# Patient Record
Sex: Male | Born: 1935
Health system: Southern US, Community
[De-identification: ages and names within clinical notes are randomized; demographics above are authoritative.]

## PROBLEM LIST (undated history)

## (undated) DIAGNOSIS — M1990 Unspecified osteoarthritis, unspecified site: Secondary | ICD-10-CM

## (undated) DIAGNOSIS — C8590 Non-Hodgkin lymphoma, unspecified, unspecified site: Secondary | ICD-10-CM

## (undated) DIAGNOSIS — C61 Malignant neoplasm of prostate: Secondary | ICD-10-CM

## (undated) DIAGNOSIS — G2581 Restless legs syndrome: Secondary | ICD-10-CM

## (undated) DIAGNOSIS — I493 Ventricular premature depolarization: Secondary | ICD-10-CM

## (undated) DIAGNOSIS — I4510 Unspecified right bundle-branch block: Secondary | ICD-10-CM

## (undated) DIAGNOSIS — N400 Enlarged prostate without lower urinary tract symptoms: Secondary | ICD-10-CM

## (undated) DIAGNOSIS — E119 Type 2 diabetes mellitus without complications: Secondary | ICD-10-CM

## (undated) DIAGNOSIS — E785 Hyperlipidemia, unspecified: Secondary | ICD-10-CM

## (undated) DIAGNOSIS — R001 Bradycardia, unspecified: Secondary | ICD-10-CM

## (undated) DIAGNOSIS — I4891 Unspecified atrial fibrillation: Secondary | ICD-10-CM

## (undated) DIAGNOSIS — R9439 Abnormal result of other cardiovascular function study: Secondary | ICD-10-CM

## (undated) DIAGNOSIS — G4730 Sleep apnea, unspecified: Secondary | ICD-10-CM

## (undated) DIAGNOSIS — J189 Pneumonia, unspecified organism: Secondary | ICD-10-CM

## (undated) DIAGNOSIS — E039 Hypothyroidism, unspecified: Secondary | ICD-10-CM

## (undated) DIAGNOSIS — Z9289 Personal history of other medical treatment: Secondary | ICD-10-CM

## (undated) HISTORY — DX: Type 2 diabetes mellitus without complications: E11.9

## (undated) HISTORY — PX: MOHS SURGERY: SUR867

## (undated) HISTORY — DX: Unspecified right bundle-branch block: I45.10

## (undated) HISTORY — DX: Non-Hodgkin lymphoma, unspecified, unspecified site: C85.90

## (undated) HISTORY — PX: COLONOSCOPY: SHX174

## (undated) HISTORY — DX: Abnormal result of other cardiovascular function study: R94.39

## (undated) HISTORY — PX: APPENDECTOMY: SHX54

## (undated) HISTORY — PX: OTHER SURGICAL HISTORY: SHX169

## (undated) HISTORY — DX: Unspecified atrial fibrillation: I48.91

## (undated) HISTORY — DX: Bradycardia, unspecified: R00.1

## (undated) HISTORY — DX: Ventricular premature depolarization: I49.3

## (undated) HISTORY — DX: Hyperlipidemia, unspecified: E78.5

## (undated) HISTORY — DX: Benign prostatic hyperplasia without lower urinary tract symptoms: N40.0

---

## 2011-08-14 DIAGNOSIS — L821 Other seborrheic keratosis: Secondary | ICD-10-CM | POA: Diagnosis not present

## 2011-08-14 DIAGNOSIS — L820 Inflamed seborrheic keratosis: Secondary | ICD-10-CM | POA: Diagnosis not present

## 2011-08-14 DIAGNOSIS — D044 Carcinoma in situ of skin of scalp and neck: Secondary | ICD-10-CM | POA: Diagnosis not present

## 2011-08-14 DIAGNOSIS — L570 Actinic keratosis: Secondary | ICD-10-CM | POA: Diagnosis not present

## 2011-08-14 DIAGNOSIS — L259 Unspecified contact dermatitis, unspecified cause: Secondary | ICD-10-CM | POA: Diagnosis not present

## 2011-08-14 DIAGNOSIS — L905 Scar conditions and fibrosis of skin: Secondary | ICD-10-CM | POA: Diagnosis not present

## 2011-08-14 DIAGNOSIS — D485 Neoplasm of uncertain behavior of skin: Secondary | ICD-10-CM | POA: Diagnosis not present

## 2011-08-14 DIAGNOSIS — C4441 Basal cell carcinoma of skin of scalp and neck: Secondary | ICD-10-CM | POA: Diagnosis not present

## 2011-08-26 DIAGNOSIS — L570 Actinic keratosis: Secondary | ICD-10-CM | POA: Diagnosis not present

## 2011-08-26 DIAGNOSIS — C4441 Basal cell carcinoma of skin of scalp and neck: Secondary | ICD-10-CM | POA: Diagnosis not present

## 2011-08-26 DIAGNOSIS — C4442 Squamous cell carcinoma of skin of scalp and neck: Secondary | ICD-10-CM | POA: Diagnosis not present

## 2011-09-18 DIAGNOSIS — L570 Actinic keratosis: Secondary | ICD-10-CM | POA: Diagnosis not present

## 2011-09-18 DIAGNOSIS — L578 Other skin changes due to chronic exposure to nonionizing radiation: Secondary | ICD-10-CM | POA: Diagnosis not present

## 2011-10-13 DIAGNOSIS — M999 Biomechanical lesion, unspecified: Secondary | ICD-10-CM | POA: Diagnosis not present

## 2011-10-13 DIAGNOSIS — M62838 Other muscle spasm: Secondary | ICD-10-CM | POA: Diagnosis not present

## 2012-05-02 DIAGNOSIS — L570 Actinic keratosis: Secondary | ICD-10-CM | POA: Diagnosis not present

## 2012-05-02 DIAGNOSIS — Z85828 Personal history of other malignant neoplasm of skin: Secondary | ICD-10-CM | POA: Diagnosis not present

## 2012-05-02 DIAGNOSIS — T148 Other injury of unspecified body region: Secondary | ICD-10-CM | POA: Diagnosis not present

## 2012-06-01 DIAGNOSIS — N401 Enlarged prostate with lower urinary tract symptoms: Secondary | ICD-10-CM | POA: Diagnosis not present

## 2012-06-16 DIAGNOSIS — Z23 Encounter for immunization: Secondary | ICD-10-CM | POA: Diagnosis not present

## 2012-06-21 DIAGNOSIS — M999 Biomechanical lesion, unspecified: Secondary | ICD-10-CM | POA: Diagnosis not present

## 2012-07-12 DIAGNOSIS — N401 Enlarged prostate with lower urinary tract symptoms: Secondary | ICD-10-CM | POA: Diagnosis not present

## 2012-07-13 ENCOUNTER — Ambulatory Visit (INDEPENDENT_AMBULATORY_CARE_PROVIDER_SITE_OTHER): Payer: Medicare Other | Admitting: Cardiology

## 2012-07-13 ENCOUNTER — Encounter: Payer: Self-pay | Admitting: Cardiology

## 2012-07-13 VITALS — BP 123/64 | HR 48 | Wt 219.0 lb

## 2012-07-13 DIAGNOSIS — I498 Other specified cardiac arrhythmias: Secondary | ICD-10-CM

## 2012-07-13 DIAGNOSIS — Z01810 Encounter for preprocedural cardiovascular examination: Secondary | ICD-10-CM | POA: Diagnosis not present

## 2012-07-13 DIAGNOSIS — R001 Bradycardia, unspecified: Secondary | ICD-10-CM | POA: Insufficient documentation

## 2012-07-13 DIAGNOSIS — Z01818 Encounter for other preprocedural examination: Secondary | ICD-10-CM | POA: Diagnosis not present

## 2012-07-13 DIAGNOSIS — E785 Hyperlipidemia, unspecified: Secondary | ICD-10-CM | POA: Diagnosis not present

## 2012-07-13 NOTE — Assessment & Plan Note (Signed)
Continue statin. 

## 2012-07-13 NOTE — Assessment & Plan Note (Signed)
Plan stress echocardiogram. If no ischemia patient may proceed with surgery.

## 2012-07-13 NOTE — Progress Notes (Signed)
  HPI: 78 year old male for preoperative evaluation prior to transurethral resection of his prostate. No prior cardiac history. Patient has noticed some increasing fatigue but denies dyspnea on exertion, orthopnea, PND, pedal edema, chest pain or syncope. He did state that over the past 2 winters when the weather is cold he has chest tightness with exertion. Because of the above we were asked to evaluate  Current Outpatient Prescriptions  Medication Sig Dispense Refill  . aspirin 81 MG tablet Take 81 mg by mouth daily.      . Cyanocobalamin (VITAMIN B-12 CR PO) Take 1 tablet by mouth daily.      [name] GLUCOSAMINE-CHONDROITIN PO Take 1 tablet by mouth daily.      . Methylsulfonylmethane (MSM PO) Take 1 tablet by mouth daily.      . Omega-3 Fatty Acids (FISH OIL PO) Take 1 tablet by mouth daily.      [name] rOPINIRole (REQUIP) 3 MG tablet Take 3 mg by mouth at bedtime.      . simvastatin (ZOCOR) 40 MG tablet Take 40 mg by mouth every evening.        No Known Allergies  Past Medical History  Diagnosis Date  . Hyperlipidemia   . BPH (benign prostatic hyperplasia)   . Lymphoma     Past Surgical History  Procedure Date  . Appendectomy   . Athroscopic knee surgery     History   Social History  . Marital Status: Widowed    Spouse Name: N/A    Number of Children: 2  . Years of Education: N/A   Occupational History  . Not on file.   Social History Main Topics  . Smoking status: Former Games developer  . Smokeless tobacco: Not on file  . Alcohol Use: Yes     Comment: Rarely  . Drug Use: Not on file  . Sexually Active: Not on file   Other Topics Concern  . Not on file   Social History Narrative  . No narrative on file    Family History  Problem Relation Age of Onset  . CAD Mother     ROS: frequency but no fevers or chills, productive cough, hemoptysis, dysphasia, odynophagia, melena, hematochezia, dysuria, hematuria, rash, seizure activity, orthopnea, PND, pedal edema, claudication.  Remaining systems are negative.  Physical Exam:   Blood pressure 123/64, pulse 48, weight 219 lb (99.338 kg).  General:  Well developed/well nourished in NAD Skin warm/dry Patient not depressed No peripheral clubbing Back-normal HEENT-normal/normal eyelids Neck supple/normal carotid upstroke bilaterally; no bruits; no JVD; no thyromegaly chest - CTA/ normal expansion CV - bradycardic but regular rhythm/normal S1 and S2; no rubs or gallops;  PMI nondisplaced, 2/6 systolic ejection murmur left sternal border. Abdomen -NT/ND, no HSM, no mass, + bowel sounds, no bruit 2+ femoral pulses, no bruits Ext-no edema, chords, 2+ DP Neuro-grossly nonfocal  ECG marked sinus bradycardia at a rate of 48. Left anterior fascicular block. Left ventricular hypertrophy. Nonspecific ST changes.

## 2012-07-13 NOTE — Patient Instructions (Addendum)
Your physician recommends that you schedule a follow-up appointment in: AS NEEDED PENDING TEST RESULTS  Your physician has requested that you have a stress echocardiogram. For further information please visit [email or web address]. Please follow instruction sheet as given.    

## 2012-07-13 NOTE — Assessment & Plan Note (Signed)
Patient does describe some fatigue and resting heart rate is 48. Stress echocardiogram will also help demonstrate chronotropic competence.

## 2012-08-04 ENCOUNTER — Ambulatory Visit (HOSPITAL_COMMUNITY): Payer: Medicare Other | Attending: Cardiology

## 2012-08-04 ENCOUNTER — Encounter: Payer: Self-pay | Admitting: Cardiology

## 2012-08-04 DIAGNOSIS — I495 Sick sinus syndrome: Secondary | ICD-10-CM

## 2012-08-04 DIAGNOSIS — Z01818 Encounter for other preprocedural examination: Secondary | ICD-10-CM

## 2012-08-04 DIAGNOSIS — R0989 Other specified symptoms and signs involving the circulatory and respiratory systems: Secondary | ICD-10-CM

## 2012-08-04 NOTE — Progress Notes (Signed)
Echocardiogram performed.  

## 2012-08-05 ENCOUNTER — Other Ambulatory Visit: Payer: Self-pay | Admitting: Urology

## 2012-08-05 ENCOUNTER — Telehealth: Payer: Self-pay | Admitting: Cardiology

## 2012-08-05 NOTE — Telephone Encounter (Signed)
Spoke with pt, aware of test results, clearance forwarded to dr Patsi Sears. Follow up scheduled

## 2012-08-05 NOTE — Telephone Encounter (Signed)
New Problem:    Patient called in returning your call. Please call back. 

## 2012-08-16 ENCOUNTER — Encounter (HOSPITAL_COMMUNITY): Payer: Self-pay | Admitting: Pharmacy Technician

## 2012-08-17 ENCOUNTER — Other Ambulatory Visit (HOSPITAL_COMMUNITY): Payer: Self-pay | Admitting: Urology

## 2012-08-17 NOTE — Progress Notes (Signed)
LOV, EKG Dr Jens Som 12/13 EPIC and on chart,  Stress eccho 1/14 EPIC and chart, clearance note DR Jens Som dated 08/05/12 chart and EPIC

## 2012-08-17 NOTE — Patient Instructions (Addendum)
22 Travis Bradford  08/17/2012   Your procedure is scheduled on:  08/29/12  MONDAY  Report to [facility] at   0615    AM.  Call this number if you have problems the morning of surgery: [phone]       Remember:   Do not eat food  Or drink :After Midnight. Sunday NIGHT   Take these medicines the morning of surgery with A SIP OF WATER:NONE   .  Contacts, dentures or partial plates can not be worn to surgery  Leave suitcase in the car. After surgery it may be brought to your room.  For patients admitted to the hospital, checkout time is 11:00 AM day of  discharge.             SPECIAL INSTRUCTIONS- SEE [employer] PREPARING FOR SURGERY INSTRUCTION SHEET-     DO NOT WEAR JEWELRY, LOTIONS, POWDERS, OR PERFUMES.  WOMEN-- DO NOT SHAVE LEGS OR UNDERARMS FOR 12 HOURS BEFORE SHOWERS. MEN MAY SHAVE FACE.  Patients discharged the day of surgery will not be allowed to drive home. IF going home the day of surgery, you must have a driver and someone to stay with you for the first 24 hours  Name and phone number of your driver:  Son  [name]                                                                     Please read over the following fact sheets that you were given: MRSA Information, Incentive Spirometry Sheet, Blood Transfusion Sheet  Information                                                                                   [name]  PST [phone]  [phone]                 FAILURE TO FOLLOW THESE INSTRUCTIONS MAY RESULT IN  CANCELLATION   OF YOUR SURGERY                                                  Patient Signature _____________________________

## 2012-08-18 ENCOUNTER — Encounter (HOSPITAL_COMMUNITY): Payer: Self-pay

## 2012-08-18 ENCOUNTER — Encounter (HOSPITAL_COMMUNITY)
Admission: RE | Admit: 2012-08-18 | Discharge: 2012-08-18 | Disposition: A | Discharge status: Home / Self Care | Payer: Medicare Other | Source: Ambulatory Visit | Attending: Urology | Admitting: Urology

## 2012-08-18 DIAGNOSIS — C8589 Other specified types of non-Hodgkin lymphoma, extranodal and solid organ sites: Secondary | ICD-10-CM | POA: Diagnosis not present

## 2012-08-18 DIAGNOSIS — N401 Enlarged prostate with lower urinary tract symptoms: Secondary | ICD-10-CM | POA: Diagnosis not present

## 2012-08-18 DIAGNOSIS — Z79899 Other long term (current) drug therapy: Secondary | ICD-10-CM | POA: Diagnosis not present

## 2012-08-18 DIAGNOSIS — Z01812 Encounter for preprocedural laboratory examination: Secondary | ICD-10-CM | POA: Diagnosis not present

## 2012-08-18 HISTORY — DX: Pneumonia, unspecified organism: J18.9

## 2012-08-18 HISTORY — DX: Unspecified osteoarthritis, unspecified site: M19.90

## 2012-08-18 HISTORY — DX: Personal history of other medical treatment: Z92.89

## 2012-08-18 LAB — CBC
Hemoglobin: 13.4 g/dL (ref 13.0–17.0)
MCH: 33 pg (ref 26.0–34.0)
Platelets: 298 10*3/uL (ref 150–400)
RBC: 4.06 MIL/uL — ABNORMAL LOW (ref 4.22–5.81)
WBC: 8.1 10*3/uL (ref 4.0–10.5)

## 2012-08-18 LAB — SURGICAL PCR SCREEN
MRSA, PCR: NEGATIVE
Staphylococcus aureus: NEGATIVE

## 2012-08-18 NOTE — Progress Notes (Signed)
08/18/12 1131  OBSTRUCTIVE SLEEP APNEA  Have you ever been diagnosed with sleep apnea through a sleep study? No (restless legs)  Do you snore loudly (loud enough to be heard through closed doors)?  1  Do you often feel tired, fatigued, or sleepy during the daytime? 1  Has anyone observed you stop breathing during your sleep? 0  Do you have, or are you being treated for high blood pressure? 0  BMI more than 35 kg/m2? 0  Age over 77 years old? 1  Gender: 1  Obstructive Sleep Apnea Score 4   Score 4 or greater  Results sent to PCP;No PCP

## 2012-08-18 NOTE — Progress Notes (Signed)
HR 40-42 in pst, states feels no different than usual. Color good, bp wnl-  ekg and cardiology report with clearance from Dr Jens Som reviewed by Dr Marjo Bicker with no changes.

## 2012-08-26 NOTE — Progress Notes (Signed)
Notified patient of time change and to arrive at short stay at [facility]

## 2012-08-28 MED ORDER — CEFAZOLIN SODIUM-DEXTROSE 2-3 GM-% IV SOLR: 2.0000 g | INTRAVENOUS | Status: DC

## 2012-08-29 ENCOUNTER — Encounter (HOSPITAL_COMMUNITY): Payer: Self-pay | Admitting: Anesthesiology

## 2012-08-29 ENCOUNTER — Encounter (HOSPITAL_COMMUNITY): Payer: Self-pay | Admitting: *Deleted

## 2012-08-29 ENCOUNTER — Ambulatory Visit (HOSPITAL_COMMUNITY): Payer: Medicare Other | Admitting: Anesthesiology

## 2012-08-29 ENCOUNTER — Encounter (HOSPITAL_COMMUNITY)
Admission: RE | Disposition: A | Discharge status: Home / Self Care | Payer: Self-pay | Source: Ambulatory Visit | Attending: Urology

## 2012-08-29 ENCOUNTER — Ambulatory Visit (HOSPITAL_COMMUNITY)
Admission: RE | Admit: 2012-08-29 | Discharge: 2012-08-29 | Disposition: A | Discharge status: Home / Self Care | Payer: Medicare Other | Source: Ambulatory Visit | Attending: Urology | Admitting: Urology

## 2012-08-29 DIAGNOSIS — N401 Enlarged prostate with lower urinary tract symptoms: Secondary | ICD-10-CM | POA: Insufficient documentation

## 2012-08-29 DIAGNOSIS — C8589 Other specified types of non-Hodgkin lymphoma, extranodal and solid organ sites: Secondary | ICD-10-CM | POA: Diagnosis not present

## 2012-08-29 DIAGNOSIS — Z79899 Other long term (current) drug therapy: Secondary | ICD-10-CM | POA: Diagnosis not present

## 2012-08-29 DIAGNOSIS — Z01812 Encounter for preprocedural laboratory examination: Secondary | ICD-10-CM | POA: Diagnosis not present

## 2012-08-29 DIAGNOSIS — N138 Other obstructive and reflux uropathy: Secondary | ICD-10-CM | POA: Diagnosis not present

## 2012-08-29 HISTORY — PX: GREEN LIGHT LASER TURP (TRANSURETHRAL RESECTION OF PROSTATE: SHX6260

## 2012-08-29 LAB — HEMOGLOBIN: Hemoglobin: 12 g/dL — ABNORMAL LOW (ref 13.0–17.0)

## 2012-08-29 SURGERY — GREEN LIGHT LASER TURP (TRANSURETHRAL RESECTION OF PROSTATE
Anesthesia: General | Wound class: Clean Contaminated

## 2012-08-29 MED ORDER — ALBUTEROL SULFATE (5 MG/ML) 0.5% IN NEBU
INHALATION_SOLUTION | RESPIRATORY_TRACT | Status: AC
  Filled 2012-08-29: qty 0.5

## 2012-08-29 MED ORDER — GLYCOPYRROLATE 0.2 MG/ML IJ SOLN
INTRAMUSCULAR | Status: DC | PRN
  Administered 2012-08-29: 0.2 mg via INTRAVENOUS

## 2012-08-29 MED ORDER — INDIGOTINDISULFONATE SODIUM 8 MG/ML IJ SOLN
INTRAMUSCULAR | Status: DC | PRN
  Administered 2012-08-29: 5 mL via INTRAVENOUS

## 2012-08-29 MED ORDER — FENTANYL CITRATE 0.05 MG/ML IJ SOLN
INTRAMUSCULAR | Status: AC
  Filled 2012-08-29: qty 2

## 2012-08-29 MED ORDER — TRAMADOL-ACETAMINOPHEN 37.5-325 MG PO TABS
1.0000 | ORAL_TABLET | Freq: Four times a day (QID) | ORAL | Status: DC | PRN
Start: 2012-08-29 — End: 2013-12-08

## 2012-08-29 MED ORDER — FENTANYL CITRATE 0.05 MG/ML IJ SOLN
25.0000 ug | INTRAMUSCULAR | Status: DC | PRN
  Administered 2012-08-29 (×4): 25 ug via INTRAVENOUS

## 2012-08-29 MED ORDER — URELLE 81 MG PO TABS: 1.0000 | ORAL_TABLET | Freq: Three times a day (TID) | ORAL | Status: DC

## 2012-08-29 MED ORDER — DEXAMETHASONE SODIUM PHOSPHATE 10 MG/ML IJ SOLN
INTRAMUSCULAR | Status: DC | PRN
  Administered 2012-08-29: 10 mg via INTRAVENOUS

## 2012-08-29 MED ORDER — ACETAMINOPHEN 10 MG/ML IV SOLN
INTRAVENOUS | Status: AC
  Filled 2012-08-29: qty 100

## 2012-08-29 MED ORDER — CEFAZOLIN SODIUM-DEXTROSE 2-3 GM-% IV SOLR
INTRAVENOUS | Status: AC
  Filled 2012-08-29: qty 50

## 2012-08-29 MED ORDER — DEXTROSE 5 % IV SOLN
INTRAVENOUS | Status: DC | PRN
  Administered 2012-08-29: 10:00:00 via INTRAVENOUS

## 2012-08-29 MED ORDER — FENTANYL CITRATE 0.05 MG/ML IJ SOLN
INTRAMUSCULAR | Status: DC | PRN
  Administered 2012-08-29: 50 ug via INTRAVENOUS
  Administered 2012-08-29: 100 ug via INTRAVENOUS
  Administered 2012-08-29 (×2): 50 ug via INTRAVENOUS

## 2012-08-29 MED ORDER — ONDANSETRON HCL 4 MG/2ML IJ SOLN
INTRAMUSCULAR | Status: DC | PRN
  Administered 2012-08-29 (×4): 1 mg via INTRAVENOUS

## 2012-08-29 MED ORDER — LACTATED RINGERS IV SOLN: INTRAVENOUS | Status: DC

## 2012-08-29 MED ORDER — LACTATED RINGERS IV SOLN
INTRAVENOUS | Status: DC | PRN
  Administered 2012-08-29 (×2): via INTRAVENOUS

## 2012-08-29 MED ORDER — LIDOCAINE HCL (CARDIAC) 20 MG/ML IV SOLN
INTRAVENOUS | Status: DC | PRN
  Administered 2012-08-29: 50 mg via INTRAVENOUS

## 2012-08-29 MED ORDER — BELLADONNA ALKALOIDS-OPIUM 16.2-60 MG RE SUPP
RECTAL | Status: DC | PRN
  Administered 2012-08-29: 1 via RECTAL

## 2012-08-29 MED ORDER — CEFAZOLIN SODIUM-DEXTROSE 2-3 GM-% IV SOLR
INTRAVENOUS | Status: DC | PRN
  Administered 2012-08-29: 2 g via INTRAVENOUS

## 2012-08-29 MED ORDER — MIDAZOLAM HCL 5 MG/5ML IJ SOLN
INTRAMUSCULAR | Status: DC | PRN
  Administered 2012-08-29: .25 mg via INTRAVENOUS

## 2012-08-29 MED ORDER — ACETAMINOPHEN 10 MG/ML IV SOLN
INTRAVENOUS | Status: DC | PRN
  Administered 2012-08-29: 1000 mg via INTRAVENOUS

## 2012-08-29 MED ORDER — SODIUM CHLORIDE 0.9 % IR SOLN
Status: DC | PRN
  Administered 2012-08-29: 26000 mL via INTRAVESICAL

## 2012-08-29 MED ORDER — TRIMETHOPRIM 100 MG PO TABS
100.0000 mg | ORAL_TABLET | ORAL | Status: DC
Start: 2012-08-29 — End: 2012-11-21

## 2012-08-29 MED ORDER — SODIUM CHLORIDE 0.9 % IV SOLN
INTRAVENOUS | Status: DC | PRN
  Administered 2012-08-29: 10:00:00 via INTRAVENOUS

## 2012-08-29 MED ORDER — ALBUTEROL SULFATE (5 MG/ML) 0.5% IN NEBU: 2.5000 mg | INHALATION_SOLUTION | Freq: Once | RESPIRATORY_TRACT | Status: DC

## 2012-08-29 MED ORDER — PROMETHAZINE HCL 25 MG/ML IJ SOLN: 6.2500 mg | INTRAMUSCULAR | Status: DC | PRN

## 2012-08-29 MED ORDER — LACTATED RINGERS IV SOLN
INTRAVENOUS | Status: DC
  Administered 2012-08-29: 1000 mL via INTRAVENOUS

## 2012-08-29 MED ORDER — INDIGOTINDISULFONATE SODIUM 8 MG/ML IJ SOLN
INTRAMUSCULAR | Status: AC
  Filled 2012-08-29: qty 5

## 2012-08-29 MED ORDER — BELLADONNA ALKALOIDS-OPIUM 16.2-60 MG RE SUPP
RECTAL | Status: AC
  Filled 2012-08-29: qty 1

## 2012-08-29 MED ORDER — PROPOFOL 10 MG/ML IV EMUL
INTRAVENOUS | Status: DC | PRN
  Administered 2012-08-29: 50 mg via INTRAVENOUS
  Administered 2012-08-29: 150 mg via INTRAVENOUS

## 2012-08-29 SURGICAL SUPPLY — 22 items
BAG URINE DRAINAGE (UROLOGICAL SUPPLIES) IMPLANT
BAG URO CATCHER STRL LF (DRAPE) ×2 IMPLANT
CATH AINSWORTH 30CC 24FR (CATHETERS) ×2 IMPLANT
CATH FOLEY 2WAY SLVR 30CC 22FR (CATHETERS) IMPLANT
CLOTH BEACON ORANGE TIMEOUT ST (SAFETY) ×2 IMPLANT
DRAPE CAMERA CLOSED 9X96 (DRAPES) ×2 IMPLANT
ELECT BUTTON HF 24-28F 2 30DE (ELECTRODE) IMPLANT
ELECT LOOP MED HF 24F 12D (CUTTING LOOP) IMPLANT
ELECT LOOP MED HF 24F 12D CBL (CLIP) IMPLANT
ELECT RESECT VAPORIZE 12D CBL (ELECTRODE) IMPLANT
GLOVE BIOGEL M STRL SZ7.5 (GLOVE) ×2 IMPLANT
GOWN STRL REIN XL XLG (GOWN DISPOSABLE) ×2 IMPLANT
GUIDEWIRE ANG ZIPWIRE 038X150 (WIRE) ×2 IMPLANT
HOLDER FOLEY CATH W/STRAP (MISCELLANEOUS) IMPLANT
KIT ASPIRATION TUBING (SET/KITS/TRAYS/PACK) IMPLANT
LASER FIBER /GREENLIGHT LASER (Laser) ×2 IMPLANT
LASER GREENLIGHT RENTAL P/PROC (Laser) ×2 IMPLANT
MANIFOLD NEPTUNE II (INSTRUMENTS) ×2 IMPLANT
PACK CYSTO (CUSTOM PROCEDURE TRAY) ×2 IMPLANT
SYR 30ML LL (SYRINGE) ×2 IMPLANT
SYRINGE IRR TOOMEY STRL 70CC (SYRINGE) IMPLANT
TUBING CONNECTING 10 (TUBING) ×2 IMPLANT

## 2012-08-29 NOTE — Op Note (Signed)
Pre-operative diagnosis :  BPH  Postoperative diagnosis: Same  Operation: Cystourethroscopy, renal and laser vaporization of markedly enlarged prostate  Surgeon:  S. Patsi Sears, MD  First assistant: None  Anesthesia:  general  Preparation: After appropriate preanesthesia, the patient was brought to the operating room, placed on the operating table in the dorsal supine position where general LMA anesthesia was introduced. He was replaced in dorsal lithotomy position with pubis was prepped with Betadine solution and draped in usual fashion. Armband was double checked.  Review history:    79 yo male with obstructed voiding pattern, and 71 cc gland. He has IPSS=14. He has cysto showing trilobar BPH, with large eccentric L sided median lobe causing bladder outlet obstruction. He needs a TUR fo his prostate, allowing a channel to void, without having a "radical" TURP. He understands that he will have a cardiology consult pre-op, and we have discussed possible complications of MI, bleeding, infection with the patient and his son. If he chooses to not have surgery, he will progress to urinary retention. Already, he strains so much to void that he has a BM trying to void for flow rate today with a full bladder.    Statement of  Likelihood of Success: Excellent. TIME-OUT observed.:  Procedure: Cystourethroscopy was accomplished, and showed massively enlarged prostate with trilobar BPH with markedly elevated bladder neck. Marked trabeculation, cellules in early diverticular formation was felt to be identified. I could not see the ureteral orifices wall, and therefore indigo carmine to identify blue contrast bilaterally. Using the green light laser, a setting of 80 W, bladder neck incisions were made. Wattage was increased to 120 W, in order for 2 separate trough incisions to be made 57 and 5:00 positions, from proximal to the bladder neck, to the Vero. Lateral lobes were vaporized, and median lobe was  vaporized also. Bleeding was noted from the right side of the prostate, and this was lased. A 24 [place] Ainsworth catheter was passed over a guidewire into the bladder, and placed to traction, and irrigated clear. The patient was awakened and taken to recovery room in good condition. He received a B. and O. suppository.

## 2012-08-29 NOTE — Progress Notes (Signed)
Update given to Dr. Patsi Sears; will continue to observe pt in PACU and transport to Short stay for continued observation

## 2012-08-29 NOTE — Progress Notes (Signed)
Dr. Rica Mast in to assess patient.-made aware of patient's vital signs includingSA02S- made aware of conversation with Dr. Patsi Sears and patient's son- O.K.  To transfer to Short STAY.

## 2012-08-29 NOTE — H&P (Signed)
Active Problems Problems  1. Benign Localized Prostatic Hyperplasia With Urinary Obstruction 600.21  History of Present Illness         79 yo [place] male returns today for cystoscopy, flowrate & PUS.  Hx of urgency, frequency, intermittant flow, incontinence & nocturia.  He has previously seen a Insurance underwriter in [place], [place]. He was placed on Flomax 12 yrs ago, but stopped the medicine, because it did not seem to help.   Past Medical History Problems  1. History of  Non-Hodgkin's Lymphoma 202.80  Surgical History Problems  1. History of  Appendectomy  Current Meds 1. Zocor 40 MG Oral Tablet; Therapy: (Recorded:06Nov2013) to  Allergies Medication  1. No Known Drug Allergies  Family History Problems  1. Maternal history of  Diabetes Mellitus V18.0 2. Family history of  Family Health Status Number Of Children 2 sons 3. Family history of  Father Deceased At Age 53 4. Family history of  Mother Deceased At Age 78  Social History Problems  1. Caffeine Use 2 per day 2. Former Smoker V15.82 1/2 ppd x 19yrs, quit 12yrs 3. Marital History - Widowed 4. Occupation: Retired Nurse, children's  5. History of  Alcohol Use  Review of Systems Genitourinary, constitutional, skin, eye, otolaryngeal, hematologic/lymphatic, cardiovascular, pulmonary, endocrine, musculoskeletal, gastrointestinal, neurological and psychiatric system(s) were reviewed and pertinent findings if present are noted.  Genitourinary: urinary frequency, feelings of urinary urgency, nocturia, incontinence and urinary stream starts and stops.    Vitals Vital Signs [Data Includes: Last 1 Day]  17Dec2013 02:50PM  Blood Pressure: 139 / 71 Temperature: 97 F Heart Rate: 45  Physical Exam Rectal: Rectal exam demonstrates normal sphincter tone, no tenderness and no masses. Estimated prostate size is 4+. Normal rectal tone, no rectal masses, prostate is smooth, symmetric and non-tender. The prostate has no nodularity and is not  tender. The left seminal vesicle is nonpalpable. The right seminal vesicle is nonpalpable. The perineum is normal on inspection.    Results/Data Urine [Data Includes: Last 1 Day]   17Dec2013  COLOR AMBER   APPEARANCE CLEAR   SPECIFIC GRAVITY 1.025   pH 5.5   GLUCOSE NEG mg/dL  BILIRUBIN NEG   KETONE TRACE mg/dL  BLOOD TRACE   PROTEIN NEG mg/dL  UROBILINOGEN 0.2 mg/dL  NITRITE NEG   LEUKOCYTE ESTERASE NEG   SQUAMOUS EPITHELIAL/HPF RARE   WBC 0-2 WBC/hpf  RBC 0-2 RBC/hpf  BACTERIA RARE   CRYSTALS NONE SEEN   CASTS NONE SEEN   Other MUCUS NOTED   Selected Results  UA With REFLEX 17Dec2013 01:49PM [name]   Test Name Result Flag Reference  COLOR AMBER A YELLOW  Biochemicals may be affected by the color of the urine.  APPEARANCE CLEAR  CLEAR  SPECIFIC GRAVITY 1.025  1.005-1.030  pH 5.5  5.0-8.0  GLUCOSE NEG mg/dL  NEG  BILIRUBIN NEG  NEG  KETONE TRACE mg/dL A NEG  BLOOD TRACE A NEG  PROTEIN NEG mg/dL  NEG  UROBILINOGEN 0.2 mg/dL  1.6-1.0  NITRITE NEG  NEG  LEUKOCYTE ESTERASE NEG  NEG  SQUAMOUS EPITHELIAL/HPF RARE  RARE  WBC 0-2 WBC/hpf  <3  RBC 0-2 RBC/hpf  <3  BACTERIA RARE  RARE  CRYSTALS NONE SEEN  NONE SEEN  CASTS NONE SEEN  NONE SEEN  Other MUCUS NOTED     Procedure  PUS - 71.37 grams   Procedure: Cystoscopy   Indication: Lower Urinary Tract Symptoms.  Informed Consent: Risks, benefits, and potential adverse events were discussed and informed  consent was obtained from the patient.  Prep: The patient was prepped with betadine.  Anesthesia:. Local anesthesia was administered intraurethrally with 2% lidocaine jelly.  Antibiotic prophylaxis: Ciprofloxacin.  Procedure Note:  Urethral meatus:. No abnormalities.  Anterior urethra: No abnormalities.  Prostatic urethra:. There was visual obstruction of the prostatic urethra. The lateral and median prostatic lobes were enlarged. An enlarged intravesical median lobe was visualized.  Bladder: The  ureteral orifices were in the normal anatomic position bilaterally. Examination of the bladder demonstrated trabeculation, but no clot within the bladder and no diverticulum cellules, but no fistula, no erythematous mucosa, no ulcer and no edema. The patient tolerated the procedure well.  Complications: None.    Assessment Assessed  1. Benign Localized Prostatic Hyperplasia With Urinary Obstruction 600.21   79 yo male with obstructed voiding pattern, and 71 cc gland. He has IPSS=14. He has cysto showing trilobar BPH, with large eccentric L sided median lobe causing bladder outlet obstruction. He needs a TUR fo his prostate, allowing a channel to void, without having a "radical" TURP. He understands that he will have a cardiology consult pre-op, and we have discussed possible complications of MI, bleeding, infection with the patient and his son. If he chooses to not have surgery, he will progress to urinary retention. Already, he strains so much to void that he has a BM trying to void for flow rate today with a full bladder.   Plan  Schedule Green Light laser. Will proceed as op with LMA anesthesia.   Signatures

## 2012-08-29 NOTE — Interval H&P Note (Signed)
History and Physical Interval Note:  08/29/2012 9:37 AM  Travis Bradford  has presented today for surgery, with the diagnosis of BPH  The various methods of treatment have been discussed with the patient and family. After consideration of risks, benefits and other options for treatment, the patient has consented to  Procedure(s) (LRB) with comments: GREEN LIGHT LASER TURP (TRANSURETHRAL RESECTION OF PROSTATE (N/A) as a surgical intervention .  The patient's history has been reviewed, patient examined, no change in status, stable for surgery.  I have reviewed the patient's chart and labs.  Questions were answered to the patient's satisfaction.     [name] I

## 2012-08-29 NOTE — Anesthesia Postprocedure Evaluation (Signed)
Anesthesia Post Note  Patient: Travis Bradford  Procedure(s) Performed: Procedure(s) (LRB): GREEN LIGHT LASER TURP (TRANSURETHRAL RESECTION OF PROSTATE (N/A)  Anesthesia type: General  Patient location: PACU  Post pain: Pain level controlled  Post assessment: Post-op Vital signs reviewed  Last Vitals:  Filed Vitals:   08/29/12 1330  BP: 114/56  Pulse: 45  Temp: 36.4 C  Resp: 14    Post vital signs: Reviewed  Level of consciousness: sedated  Complications: No apparent anesthesia complications

## 2012-08-29 NOTE — Anesthesia Preprocedure Evaluation (Addendum)
Anesthesia Evaluation  Patient identified by MRN, date of birth, ID band Patient awake    Reviewed: Allergy & Precautions, H&P , NPO status , Patient's Chart, lab work & pertinent test results  Airway Mallampati: II  Neck ROM: Full    Dental  (+) Poor Dentition and Dental Advisory Given   Pulmonary neg pulmonary ROS,  breath sounds clear to auscultation  Pulmonary exam normal       Cardiovascular negative cardio ROS  Rhythm:Regular Rate:Normal     Neuro/Psych negative neurological ROS  negative psych ROS   GI/Hepatic negative GI ROS, Neg liver ROS,   Endo/Other  negative endocrine ROS  Renal/GU negative Renal ROS  negative genitourinary   Musculoskeletal negative musculoskeletal ROS (+)   Abdominal   Peds  Hematology History of NonHodgkins Lymphoma   Anesthesia Other Findings   Reproductive/Obstetrics negative OB ROS                         Anesthesia Physical Anesthesia Plan  ASA: II  Anesthesia Plan: General   Post-op Pain Management:    Induction: Intravenous  Airway Management Planned: LMA  Additional Equipment:   Intra-op Plan:   Post-operative Plan: Extubation in OR  Informed Consent: I have reviewed the patients History and Physical, chart, labs and discussed the procedure including the risks, benefits and alternatives for the proposed anesthesia with the patient or authorized representative who has indicated his/her understanding and acceptance.   Dental advisory given  Plan Discussed with: CRNA  Anesthesia Plan Comments:         Anesthesia Quick Evaluation

## 2012-08-29 NOTE — Progress Notes (Signed)
Dr. Rica Mast in to check pt; will continue to observe pt in PACU; no new orders at present

## 2012-08-29 NOTE — Preoperative (Signed)
Beta Blockers   Reason not to administer Beta Blockers:Not Applicable 

## 2012-08-29 NOTE — Progress Notes (Signed)
Pt c/0 diff breathing; resps 14, SAO2 96-99; pt's lungs with exp wheeze that clears with coughing; Dr. Rica Mast, anesthes in to check pt, orders rec'd; vent tx done and EKG done

## 2012-08-29 NOTE — Transfer of Care (Signed)
Immediate Anesthesia Transfer of Care Note  Patient: Travis Bradford  Procedure(s) Performed: Procedure(s) (LRB) with comments: GREEN LIGHT LASER TURP (TRANSURETHRAL RESECTION OF PROSTATE (N/A)  Patient Location: PACU  Anesthesia Type:General  Level of Consciousness: awake, oriented, patient cooperative, lethargic and responds to stimulation  Airway & Oxygen Therapy: Patient Spontanous Breathing and Patient connected to face mask oxygen  Post-op Assessment: Report given to PACU RN, Post -op Vital signs reviewed and stable and Patient moving all extremities  Post vital signs: Reviewed and stable  Complications: No apparent anesthesia complications

## 2012-08-30 ENCOUNTER — Encounter (HOSPITAL_COMMUNITY): Payer: Self-pay | Admitting: Urology

## 2012-10-17 ENCOUNTER — Encounter: Payer: Self-pay | Admitting: *Deleted

## 2012-10-17 ENCOUNTER — Encounter: Payer: Self-pay | Admitting: Cardiology

## 2012-10-18 ENCOUNTER — Encounter: Payer: Self-pay | Admitting: Cardiology

## 2012-10-18 ENCOUNTER — Ambulatory Visit (INDEPENDENT_AMBULATORY_CARE_PROVIDER_SITE_OTHER): Payer: Medicare Other | Admitting: Cardiology

## 2012-10-18 VITALS — BP 119/63 | HR 52 | Ht 70.0 in | Wt 215.0 lb

## 2012-10-18 DIAGNOSIS — R001 Bradycardia, unspecified: Secondary | ICD-10-CM

## 2012-10-18 DIAGNOSIS — R9430 Abnormal result of cardiovascular function study, unspecified: Secondary | ICD-10-CM | POA: Diagnosis not present

## 2012-10-18 DIAGNOSIS — E785 Hyperlipidemia, unspecified: Secondary | ICD-10-CM | POA: Diagnosis not present

## 2012-10-18 DIAGNOSIS — I498 Other specified cardiac arrhythmias: Secondary | ICD-10-CM

## 2012-10-18 DIAGNOSIS — N401 Enlarged prostate with lower urinary tract symptoms: Secondary | ICD-10-CM | POA: Diagnosis not present

## 2012-10-18 NOTE — Assessment & Plan Note (Signed)
Long discussion with patient and son today concerning stress echocardiogram.he has mild dyspnea on exertion but no chest pain. I do not consider his study high risk. We discussed options of medical therapy versus proceeding with cardiac catheterization. He would prefer the latter and I think this is reasonable. Continue aspirin and statin. He will return in 6 months. Patient instructed on symptoms of chest pain and increasing dyspnea. We could reconsider catheterization if his symptoms change.

## 2012-10-18 NOTE — Assessment & Plan Note (Signed)
Continue statin. 

## 2012-10-18 NOTE — Assessment & Plan Note (Signed)
Patient's heart rate increased to 120 with exercise. He therefore has demonstrated chronotropic competence.

## 2012-10-18 NOTE — Progress Notes (Signed)
HPI: Pleasant male for fu of abnormal stress echo. Recently seen for preoperative evaluation prior to transurethral resection of his prostate. Stress echocardiogram in January of 2014 showed no ST changes and no chest pain. There was frequent PVCs and an isolated couplet noted. There was hypokinesis of the mid and distal inferior wall suggestive of ischemia. Since I last saw him, he has some dyspnea on exertion but no orthopnea, PND, pedal edema, palpitations or syncope. No chest pain.   Current Outpatient Prescriptions  Medication Sig Dispense Refill  . cholecalciferol (VITAMIN D) 1000 UNITS tablet Take 1,000 Units by mouth daily.      . Cyanocobalamin (VITAMIN B-12) 2500 MCG SUBL Place 2,500 mcg under the tongue daily.      . fish oil-omega-3 fatty acids 1000 MG capsule Take 2 g by mouth daily.      [name] glucosamine-chondroitin 500-400 MG tablet Take 1 tablet by mouth daily.      . Methylsulfonylmethane (MSM) 500 MG CAPS Take 1 capsule by mouth daily.      [name] rOPINIRole (REQUIP) 3 MG tablet Take 3 mg by mouth at bedtime.      . simvastatin (ZOCOR) 40 MG tablet Take 40 mg by mouth daily before breakfast.       . traMADol-acetaminophen (ULTRACET) 37.5-325 MG per tablet Take 1 tablet by mouth every 6 (six) hours as needed for pain.  30 tablet  2  . trimethoprim (TRIMPEX) 100 MG tablet Take 1 tablet (100 mg total) by mouth 1 day or 1 dose.  30 tablet  1  . URELLE (URELLE/URISED) 81 MG TABS Take 1 tablet (81 mg total) by mouth 3 (three) times daily.  30 each  2   No current facility-administered medications for this visit.     Past Medical History  Diagnosis Date  . Hyperlipidemia   . BPH (benign prostatic hyperplasia)   . Lymphoma   . Arthritis   . History of blood transfusion   . Pneumonia     Past Surgical History  Procedure Laterality Date  . Appendectomy    . Athroscopic knee surgery    . Mohs surgery      x 6  . Dental implants    . Green light laser turp (transurethral  resection of prostate  08/29/2012    Procedure: GREEN LIGHT LASER TURP (TRANSURETHRAL RESECTION OF PROSTATE;  Surgeon: Kathi Ludwig, MD;  Location: [facility];  Service: Urology;  Laterality: N/A;    History   Social History  . Marital Status: Widowed    Spouse Name: N/A    Number of Children: 2  . Years of Education: N/A   Occupational History  . Not on file.   Social History Main Topics  . Smoking status: Former Games developer  . Smokeless tobacco: Never Used  . Alcohol Use: Yes     Comment: Rarely  . Drug Use: Not on file  . Sexually Active: Not on file   Other Topics Concern  . Not on file   Social History Narrative  . No narrative on file    ROS: fatigue but no fevers or chills, productive cough, hemoptysis, dysphasia, odynophagia, melena, hematochezia, dysuria, hematuria, rash, seizure activity, orthopnea, PND, pedal edema, claudication. Remaining systems are negative.  Physical Exam: Well-developed well-nourished in no acute distress.  Skin is warm and dry.  HEENT is normal.  Neck is supple.  Chest is clear to auscultation with normal expansion.  Cardiovascular exam is regular rate and rhythm.  Abdominal  exam nontender or distended. No masses palpated. Extremities show no edema. neuro grossly intact

## 2012-10-18 NOTE — Patient Instructions (Addendum)
Your physician recommends that you continue on your current medications as directed. Please refer to the Current Medication list given to you today.  Your physician wants you to follow-up in: 6 months. You will receive a reminder letter in the mail two months in advance. If you don't receive a letter, please call our office to schedule the follow-up appointment.  

## 2012-11-21 ENCOUNTER — Ambulatory Visit (INDEPENDENT_AMBULATORY_CARE_PROVIDER_SITE_OTHER): Payer: Medicare Other | Admitting: Internal Medicine

## 2012-11-21 VITALS — BP 116/69 | HR 67 | Temp 98.0°F | Resp 18 | Ht 70.5 in | Wt 212.6 lb

## 2012-11-21 DIAGNOSIS — N390 Urinary tract infection, site not specified: Secondary | ICD-10-CM | POA: Diagnosis not present

## 2012-11-21 DIAGNOSIS — R8281 Pyuria: Secondary | ICD-10-CM

## 2012-11-21 DIAGNOSIS — R809 Proteinuria, unspecified: Secondary | ICD-10-CM

## 2012-11-21 DIAGNOSIS — R82998 Other abnormal findings in urine: Secondary | ICD-10-CM | POA: Diagnosis not present

## 2012-11-21 LAB — POCT URINALYSIS DIPSTICK
Bilirubin, UA: NEGATIVE
Nitrite, UA: NEGATIVE
Protein, UA: NEGATIVE
Urobilinogen, UA: 0.2
pH, UA: 6

## 2012-11-21 LAB — POCT UA - MICROSCOPIC ONLY
Casts, Ur, LPF, POC: NEGATIVE
Yeast, UA: NEGATIVE

## 2012-11-21 MED ORDER — SULFAMETHOXAZOLE-TRIMETHOPRIM 800-160 MG PO TABS: 1.0000 | ORAL_TABLET | Freq: Two times a day (BID) | ORAL | Status: DC

## 2012-11-21 NOTE — Patient Instructions (Addendum)
Repeat urine screen for protein in 3-4 weeks. You may stop the Lisinopril in the meanwhile.

## 2012-11-21 NOTE — Progress Notes (Signed)
  Subjective:    Patient ID: Travis Bradford, male    DOB: April 06, 1926, 79 y.o.   MRN: [id number]  HPI Dr Perlie Mayo laser surgery for prostate February 2014 preop stress test ok 4/17 annual ck [facility]-they discovered a positive microalbuminuria and wanted him to start lisinopril/he does not have diabetes He wants a second opinion because he knows this is a blood pressure medicine and he has never had a problem with hypertension Note that labs from the [place] include pyuria but no culture was done He denies dysuria frequency and urgency   Review of Systems No fever chills or night sweats No groin or testicular pain    Objective:   Physical Exam BP 116/69  Pulse 67  Temp(Src) 98 F (36.7 C) (Oral)  Resp 18  Ht 5' 10.5" (1.791 m)  Wt 212 lb 9.6 oz (96.435 kg)  BMI 30.06 kg/m2  SpO2 97% No CVA tenderness   Results for orders placed in visit on 11/21/12  POCT URINALYSIS DIPSTICK      Result Value Range   Color, UA yellow     Clarity, UA hazy     Glucose, UA neg     Bilirubin, UA neg     Ketones, UA neg     Spec Grav, UA 1.010     Blood, UA moderate     pH, UA 6.0     Protein, UA neg     Urobilinogen, UA 0.2     Nitrite, UA neg     Leukocytes, UA large (3+)    POCT UA - MICROSCOPIC ONLY      Result Value Range   WBC, Ur, HPF, POC tntc     RBC, urine, microscopic 0-2     Bacteria, U Microscopic trace     Mucus, UA neg     Epithelial cells, urine per micros 0-1     Crystals, Ur, HPF, POC neg     Casts, Ur, LPF, POC neg     Yeast, UA neg         Assessment & Plan:  Pyuria   proteinuria= microalbuminuria by history  Culture urine Start Septra DS Discontinue lisinopril Repeat urine protein and microalbumin in 3 weeks-if elevated consider serum immuno electrophoresis and protein electrophoresis

## 2012-11-22 ENCOUNTER — Encounter: Payer: Self-pay | Admitting: Internal Medicine

## 2012-11-23 ENCOUNTER — Encounter: Payer: Self-pay | Admitting: Internal Medicine

## 2012-11-23 LAB — URINE CULTURE
Colony Count: NO GROWTH
Organism ID, Bacteria: NO GROWTH

## 2012-12-13 ENCOUNTER — Ambulatory Visit (INDEPENDENT_AMBULATORY_CARE_PROVIDER_SITE_OTHER): Payer: Medicare Other | Admitting: Internal Medicine

## 2012-12-13 ENCOUNTER — Other Ambulatory Visit: Payer: Self-pay | Admitting: Internal Medicine

## 2012-12-13 VITALS — BP 118/72 | HR 54 | Temp 98.0°F | Resp 17 | Ht 71.5 in | Wt 218.0 lb

## 2012-12-13 DIAGNOSIS — D539 Nutritional anemia, unspecified: Secondary | ICD-10-CM

## 2012-12-13 DIAGNOSIS — E119 Type 2 diabetes mellitus without complications: Secondary | ICD-10-CM | POA: Diagnosis not present

## 2012-12-13 DIAGNOSIS — R8281 Pyuria: Secondary | ICD-10-CM

## 2012-12-13 DIAGNOSIS — D649 Anemia, unspecified: Secondary | ICD-10-CM

## 2012-12-13 DIAGNOSIS — R82998 Other abnormal findings in urine: Secondary | ICD-10-CM | POA: Diagnosis not present

## 2012-12-13 DIAGNOSIS — R809 Proteinuria, unspecified: Secondary | ICD-10-CM

## 2012-12-13 DIAGNOSIS — R5381 Other malaise: Secondary | ICD-10-CM | POA: Diagnosis not present

## 2012-12-13 LAB — POCT URINALYSIS DIPSTICK
Glucose, UA: NEGATIVE
Ketones, UA: NEGATIVE
Spec Grav, UA: 1.015
Urobilinogen, UA: 0.2

## 2012-12-13 LAB — POCT CBC
HCT, POC: 42.2 % — AB (ref 43.5–53.7)
Hemoglobin: 13.1 g/dL — AB (ref 14.1–18.1)
MCH, POC: 32.4 pg — AB (ref 27–31.2)
MCV: 104.4 fL — AB (ref 80–97)
RBC: 4.04 M/uL — AB (ref 4.69–6.13)
WBC: 11.6 10*3/uL — AB (ref 4.6–10.2)

## 2012-12-13 LAB — POCT UA - MICROSCOPIC ONLY: Mucus, UA: NEGATIVE

## 2012-12-13 NOTE — Progress Notes (Signed)
Subjective:    Patient ID: Travis Bradford, male    DOB: [birth year], 78 y.o.   MRN: [id number]  HPI he presented for the first time one month ago with records from the [facility] revealing proteinuria and microalbuminuria/he had been advised to start lisinopril but he was questioning this. Labs showed tntc wbc on u/a here --UC revealed no growth-he was treated with 10d septra//he is here for followup  He brings the rest of his laboratory results from [place] and several other abnormalities are noted. A urine culture was done there which also was negative. His hemoglobin A1c was 6.2%. Hemoglobin 13.3 with MCV of 100.5. RDW was normal. Metabolic profile was normal. Thyroid testing was normal.    Review of Systems No fever or night sweats   no unusual weight changes No fatigue No edema No back pain  Objective:   Physical Exam BP 118/72  Pulse 54  Temp(Src) 98 F (36.7 C) (Oral)  Resp 17  Ht 5' 11.5" (1.816 m)  Wt 218 lb (98.884 kg)  BMI 29.98 kg/m2  SpO2 97%    Results for orders placed in visit on 12/13/12  POCT CBC      Result Value Range   WBC 11.6 (*) 4.6 - 10.2 K/uL   Lymph, poc 3.4  0.6 - 3.4   POC LYMPH PERCENT 29.6  10 - 50 %L   MID (cbc) 0.7  0 - 0.9   POC MID % 5.8  0 - 12 %M   POC Granulocyte 7.5 (*) 2 - 6.9   Granulocyte percent 64.6  37 - 80 %G   RBC 4.04 (*) 4.69 - 6.13 M/uL   Hemoglobin 13.1 (*) 14.1 - 18.1 g/dL   HCT, POC 40.9 (*) 81.1 - 53.7 %   MCV 104.4 (*) 80 - 97 fL   MCH, POC 32.4 (*) 27 - 31.2 pg   MCHC 31.0 (*) 31.8 - 35.4 g/dL   RDW, POC 91.4     Platelet Count, POC 314  142 - 424 K/uL   MPV 9.2  0 - 99.8 fL  POCT URINALYSIS DIPSTICK      Result Value Range   Color, UA yellow     Clarity, UA cloudy     Glucose, UA neg     Bilirubin, UA neg     Ketones, UA neg     Spec Grav, UA 1.015     Blood, UA small     pH, UA 6.5     Protein, UA neg     Urobilinogen, UA 0.2     Nitrite, UA neg     Leukocytes, UA large (3+)    POCT UA - MICROSCOPIC  ONLY      Result Value Range   WBC, Ur, HPF, POC tntc     RBC, urine, microscopic 2-4     Bacteria, U Microscopic trace     Mucus, UA neg     Epithelial cells, urine per micros 0-1     Crystals, Ur, HPF, POC neg     Casts, Ur, LPF, POC neg     Yeast, UA neg     urine microalbumin elevated but less so Vitamin B level over 1000    Assessment & Plan:  Problem #1 persistent pyuria Problem #2 microalbuminuria in the setting of mild diabetes Problem #3 proteinuria now resolved Problem #4 mild macrocytic anemia Problem #5 on simvastatin for hyperlipidemia  He has moved to his son's house in [place]/will set up internal medicine  followup for him here(Dr Perini or partners) Peripheral smear and serum protein electrophoresis added to rule out the more concerning causes of macrocytosis If UC again negative will need eval to r/o uroepithelial dysplasia or chronic interstitial nephritis(will have Dr. Patsi Sears see him again-already scheduled for 2 weeks as followup of laser surgery)

## 2012-12-14 DIAGNOSIS — E1129 Type 2 diabetes mellitus with other diabetic kidney complication: Secondary | ICD-10-CM | POA: Insufficient documentation

## 2012-12-14 DIAGNOSIS — D649 Anemia, unspecified: Secondary | ICD-10-CM | POA: Insufficient documentation

## 2012-12-14 LAB — MICROALBUMIN, URINE: Microalb, Ur: 4.78 mg/dL — ABNORMAL HIGH (ref 0.00–1.89)

## 2012-12-14 LAB — URINE CULTURE: Organism ID, Bacteria: NO GROWTH

## 2012-12-16 ENCOUNTER — Encounter: Payer: Self-pay | Admitting: Internal Medicine

## 2012-12-16 DIAGNOSIS — R809 Proteinuria, unspecified: Secondary | ICD-10-CM

## 2012-12-16 DIAGNOSIS — E119 Type 2 diabetes mellitus without complications: Secondary | ICD-10-CM

## 2012-12-16 LAB — PROTEIN ELECTROPHORESIS, SERUM
Albumin ELP: 57.3 % (ref 55.8–66.1)
Beta Globulin: 6.9 % (ref 4.7–7.2)
Total Protein, Serum Electrophoresis: 6.7 g/dL (ref 6.0–8.3)

## 2012-12-20 ENCOUNTER — Telehealth: Payer: Self-pay

## 2012-12-20 NOTE — Telephone Encounter (Signed)
Spoke with pt's son, he was wondering if Dr Merla Riches was going to refer pt to a family care doctor. (Dr Eugene Gavia) Does he need a referral to do this. He just wanted to know what the process was to get in to see a doctor. He is also concerned about his dad's WBC count being elevated. Does he need to recheck for this? He states Dr Merla Riches is out of town for a couple of days and didn't want this to be put in the back of the line. Please advise.

## 2012-12-21 NOTE — Telephone Encounter (Signed)
Called him to advise.  

## 2012-12-21 NOTE — Telephone Encounter (Signed)
Spoke to patient his appt with Dr Waynard Edwards is on June 10 , please advise if okay to wait until then for the CBC to be rechecked or if he should come in before then. Please advise.

## 2012-12-21 NOTE — Telephone Encounter (Signed)
Referral done. Patient can certainly come back for a recheck CBC.

## 2012-12-21 NOTE — Telephone Encounter (Signed)
He can absolutely have the CBC repeated at Dr. Laurey Morale office on 01/03/2013.

## 2013-01-04 DIAGNOSIS — N401 Enlarged prostate with lower urinary tract symptoms: Secondary | ICD-10-CM | POA: Diagnosis not present

## 2013-01-05 DIAGNOSIS — R5383 Other fatigue: Secondary | ICD-10-CM | POA: Diagnosis not present

## 2013-01-05 DIAGNOSIS — E785 Hyperlipidemia, unspecified: Secondary | ICD-10-CM | POA: Diagnosis not present

## 2013-01-05 DIAGNOSIS — G4733 Obstructive sleep apnea (adult) (pediatric): Secondary | ICD-10-CM | POA: Diagnosis not present

## 2013-01-05 DIAGNOSIS — G2581 Restless legs syndrome: Secondary | ICD-10-CM | POA: Diagnosis not present

## 2013-01-05 DIAGNOSIS — R5381 Other malaise: Secondary | ICD-10-CM | POA: Diagnosis not present

## 2013-01-05 DIAGNOSIS — N400 Enlarged prostate without lower urinary tract symptoms: Secondary | ICD-10-CM | POA: Diagnosis not present

## 2013-01-05 DIAGNOSIS — Z6830 Body mass index (BMI) 30.0-30.9, adult: Secondary | ICD-10-CM | POA: Diagnosis not present

## 2013-04-19 DIAGNOSIS — C44319 Basal cell carcinoma of skin of other parts of face: Secondary | ICD-10-CM | POA: Diagnosis not present

## 2013-04-19 DIAGNOSIS — Z85828 Personal history of other malignant neoplasm of skin: Secondary | ICD-10-CM | POA: Diagnosis not present

## 2013-05-09 ENCOUNTER — Ambulatory Visit (INDEPENDENT_AMBULATORY_CARE_PROVIDER_SITE_OTHER): Payer: Medicare Other | Admitting: *Deleted

## 2013-05-09 DIAGNOSIS — Z23 Encounter for immunization: Secondary | ICD-10-CM

## 2013-05-18 DIAGNOSIS — Z85828 Personal history of other malignant neoplasm of skin: Secondary | ICD-10-CM | POA: Diagnosis not present

## 2013-05-18 DIAGNOSIS — C44319 Basal cell carcinoma of skin of other parts of face: Secondary | ICD-10-CM | POA: Diagnosis not present

## 2013-10-18 DIAGNOSIS — C44319 Basal cell carcinoma of skin of other parts of face: Secondary | ICD-10-CM | POA: Diagnosis not present

## 2013-10-18 DIAGNOSIS — L819 Disorder of pigmentation, unspecified: Secondary | ICD-10-CM | POA: Diagnosis not present

## 2013-10-18 DIAGNOSIS — D239 Other benign neoplasm of skin, unspecified: Secondary | ICD-10-CM | POA: Diagnosis not present

## 2013-10-18 DIAGNOSIS — Z85828 Personal history of other malignant neoplasm of skin: Secondary | ICD-10-CM | POA: Diagnosis not present

## 2013-10-18 DIAGNOSIS — L570 Actinic keratosis: Secondary | ICD-10-CM | POA: Diagnosis not present

## 2013-10-18 DIAGNOSIS — D485 Neoplasm of uncertain behavior of skin: Secondary | ICD-10-CM | POA: Diagnosis not present

## 2013-10-18 DIAGNOSIS — L723 Sebaceous cyst: Secondary | ICD-10-CM | POA: Diagnosis not present

## 2013-10-31 DIAGNOSIS — Z85828 Personal history of other malignant neoplasm of skin: Secondary | ICD-10-CM | POA: Diagnosis not present

## 2013-10-31 DIAGNOSIS — D485 Neoplasm of uncertain behavior of skin: Secondary | ICD-10-CM | POA: Diagnosis not present

## 2013-11-20 DIAGNOSIS — C44111 Basal cell carcinoma of skin of unspecified eyelid, including canthus: Secondary | ICD-10-CM | POA: Diagnosis not present

## 2013-11-20 DIAGNOSIS — Z85828 Personal history of other malignant neoplasm of skin: Secondary | ICD-10-CM | POA: Diagnosis not present

## 2013-12-08 ENCOUNTER — Emergency Department (HOSPITAL_COMMUNITY): Payer: Medicare Other

## 2013-12-08 ENCOUNTER — Inpatient Hospital Stay (HOSPITAL_COMMUNITY)
Admission: EM | Admit: 2013-12-08 | Discharge: 2013-12-11 | DRG: 871 | Disposition: A | Discharge status: Home / Self Care | Payer: Medicare Other | Attending: Internal Medicine | Admitting: Internal Medicine

## 2013-12-08 ENCOUNTER — Encounter (HOSPITAL_COMMUNITY): Payer: Self-pay | Admitting: Emergency Medicine

## 2013-12-08 DIAGNOSIS — A419 Sepsis, unspecified organism: Secondary | ICD-10-CM

## 2013-12-08 DIAGNOSIS — E039 Hypothyroidism, unspecified: Secondary | ICD-10-CM

## 2013-12-08 DIAGNOSIS — I959 Hypotension, unspecified: Secondary | ICD-10-CM | POA: Diagnosis present

## 2013-12-08 DIAGNOSIS — Z7982 Long term (current) use of aspirin: Secondary | ICD-10-CM | POA: Diagnosis not present

## 2013-12-08 DIAGNOSIS — Z9079 Acquired absence of other genital organ(s): Secondary | ICD-10-CM | POA: Diagnosis not present

## 2013-12-08 DIAGNOSIS — R0989 Other specified symptoms and signs involving the circulatory and respiratory systems: Secondary | ICD-10-CM | POA: Diagnosis not present

## 2013-12-08 DIAGNOSIS — R0602 Shortness of breath: Secondary | ICD-10-CM | POA: Diagnosis not present

## 2013-12-08 DIAGNOSIS — I498 Other specified cardiac arrhythmias: Secondary | ICD-10-CM | POA: Diagnosis present

## 2013-12-08 DIAGNOSIS — N289 Disorder of kidney and ureter, unspecified: Secondary | ICD-10-CM | POA: Diagnosis present

## 2013-12-08 DIAGNOSIS — N058 Unspecified nephritic syndrome with other morphologic changes: Secondary | ICD-10-CM | POA: Diagnosis present

## 2013-12-08 DIAGNOSIS — Z87891 Personal history of nicotine dependence: Secondary | ICD-10-CM | POA: Diagnosis not present

## 2013-12-08 DIAGNOSIS — Z79899 Other long term (current) drug therapy: Secondary | ICD-10-CM

## 2013-12-08 DIAGNOSIS — D72829 Elevated white blood cell count, unspecified: Secondary | ICD-10-CM | POA: Diagnosis present

## 2013-12-08 DIAGNOSIS — I452 Bifascicular block: Secondary | ICD-10-CM | POA: Diagnosis present

## 2013-12-08 DIAGNOSIS — R001 Bradycardia, unspecified: Secondary | ICD-10-CM

## 2013-12-08 DIAGNOSIS — R000 Tachycardia, unspecified: Secondary | ICD-10-CM

## 2013-12-08 DIAGNOSIS — R6520 Severe sepsis without septic shock: Secondary | ICD-10-CM

## 2013-12-08 DIAGNOSIS — Z8546 Personal history of malignant neoplasm of prostate: Secondary | ICD-10-CM

## 2013-12-08 DIAGNOSIS — E119 Type 2 diabetes mellitus without complications: Secondary | ICD-10-CM

## 2013-12-08 DIAGNOSIS — N400 Enlarged prostate without lower urinary tract symptoms: Secondary | ICD-10-CM | POA: Diagnosis present

## 2013-12-08 DIAGNOSIS — R6521 Severe sepsis with septic shock: Secondary | ICD-10-CM

## 2013-12-08 DIAGNOSIS — R0603 Acute respiratory distress: Secondary | ICD-10-CM | POA: Diagnosis present

## 2013-12-08 DIAGNOSIS — E785 Hyperlipidemia, unspecified: Secondary | ICD-10-CM | POA: Diagnosis present

## 2013-12-08 DIAGNOSIS — J189 Pneumonia, unspecified organism: Secondary | ICD-10-CM | POA: Diagnosis not present

## 2013-12-08 DIAGNOSIS — G2581 Restless legs syndrome: Secondary | ICD-10-CM

## 2013-12-08 DIAGNOSIS — R9431 Abnormal electrocardiogram [ECG] [EKG]: Secondary | ICD-10-CM | POA: Diagnosis not present

## 2013-12-08 DIAGNOSIS — R0609 Other forms of dyspnea: Secondary | ICD-10-CM | POA: Diagnosis not present

## 2013-12-08 DIAGNOSIS — R062 Wheezing: Secondary | ICD-10-CM | POA: Diagnosis not present

## 2013-12-08 DIAGNOSIS — E1129 Type 2 diabetes mellitus with other diabetic kidney complication: Secondary | ICD-10-CM

## 2013-12-08 DIAGNOSIS — M1990 Unspecified osteoarthritis, unspecified site: Secondary | ICD-10-CM

## 2013-12-08 HISTORY — DX: Malignant neoplasm of prostate: C61

## 2013-12-08 HISTORY — DX: Hypothyroidism, unspecified: E03.9

## 2013-12-08 LAB — CBC WITH DIFFERENTIAL/PLATELET
Basophils Absolute: 0 10*3/uL (ref 0.0–0.1)
Basophils Relative: 0 % (ref 0–1)
EOS ABS: 0.2 10*3/uL (ref 0.0–0.7)
EOS PCT: 1 % (ref 0–5)
HEMATOCRIT: 39.8 % (ref 39.0–52.0)
Hemoglobin: 13.1 g/dL (ref 13.0–17.0)
LYMPHS ABS: 0.8 10*3/uL (ref 0.7–4.0)
LYMPHS PCT: 4 % — AB (ref 12–46)
MCH: 32.3 pg (ref 26.0–34.0)
MCHC: 32.9 g/dL (ref 30.0–36.0)
MCV: 98.3 fL (ref 78.0–100.0)
MONO ABS: 0.8 10*3/uL (ref 0.1–1.0)
Monocytes Relative: 4 % (ref 3–12)
Neutro Abs: 16.9 10*3/uL — ABNORMAL HIGH (ref 1.7–7.7)
Neutrophils Relative %: 91 % — ABNORMAL HIGH (ref 43–77)
Platelets: 305 10*3/uL (ref 150–400)
RBC: 4.05 MIL/uL — AB (ref 4.22–5.81)
RDW: 12.6 % (ref 11.5–15.5)
WBC: 18.7 10*3/uL — ABNORMAL HIGH (ref 4.0–10.5)

## 2013-12-08 LAB — COMPREHENSIVE METABOLIC PANEL
ALT: 16 U/L (ref 0–53)
AST: 19 U/L (ref 0–37)
Albumin: 3.4 g/dL — ABNORMAL LOW (ref 3.5–5.2)
Alkaline Phosphatase: 60 U/L (ref 39–117)
BUN: 23 mg/dL (ref 6–23)
CO2: 25 meq/L (ref 19–32)
CREATININE: 1.23 mg/dL (ref 0.50–1.35)
Calcium: 9.5 mg/dL (ref 8.4–10.5)
Chloride: 99 mEq/L (ref 96–112)
GFR calc Af Amer: 60 mL/min — ABNORMAL LOW (ref >90)
GFR, EST NON AFRICAN AMERICAN: 52 mL/min — AB (ref >90)
GLUCOSE: 147 mg/dL — AB (ref 70–99)
Potassium: 4.4 mEq/L (ref 3.7–5.3)
SODIUM: 137 meq/L (ref 137–147)
TOTAL PROTEIN: 7.2 g/dL (ref 6.0–8.3)
Total Bilirubin: 0.3 mg/dL (ref 0.3–1.2)

## 2013-12-08 LAB — TROPONIN I

## 2013-12-08 MED ORDER — ALBUTEROL SULFATE (2.5 MG/3ML) 0.083% IN NEBU
5.0000 mg | INHALATION_SOLUTION | Freq: Once | RESPIRATORY_TRACT | Status: AC
  Administered 2013-12-09: 5 mg via RESPIRATORY_TRACT
  Filled 2013-12-08: qty 6

## 2013-12-08 MED ORDER — ALBUTEROL SULFATE (2.5 MG/3ML) 0.083% IN NEBU
5.0000 mg | INHALATION_SOLUTION | Freq: Once | RESPIRATORY_TRACT | Status: AC
  Administered 2013-12-08: 5 mg via RESPIRATORY_TRACT
  Filled 2013-12-08: qty 6

## 2013-12-08 MED ORDER — DEXTROSE 5 % IV SOLN
500.0000 mg | Freq: Once | INTRAVENOUS | Status: AC
  Administered 2013-12-09: 500 mg via INTRAVENOUS

## 2013-12-08 MED ORDER — ROPINIROLE HCL 1 MG PO TABS
3.0000 mg | ORAL_TABLET | Freq: Once | ORAL | Status: AC
  Administered 2013-12-08: 3 mg via ORAL
  Filled 2013-12-08: qty 3

## 2013-12-08 MED ORDER — ACETAMINOPHEN 325 MG PO TABS
650.0000 mg | ORAL_TABLET | Freq: Once | ORAL | Status: AC
Start: 2013-12-08 — End: 2013-12-08
  Administered 2013-12-08: 650 mg via ORAL
  Filled 2013-12-08: qty 2

## 2013-12-08 MED ORDER — CEFTRIAXONE SODIUM 1 G IJ SOLR
1.0000 g | Freq: Once | INTRAMUSCULAR | Status: AC
  Administered 2013-12-09: 1 g via INTRAVENOUS
  Filled 2013-12-08: qty 10

## 2013-12-08 MED ORDER — SODIUM CHLORIDE 0.9 % IV BOLUS (SEPSIS)
500.0000 mL | Freq: Once | INTRAVENOUS | Status: AC
  Administered 2013-12-09: 500 mL via INTRAVENOUS

## 2013-12-08 NOTE — ED Notes (Signed)
EKG given to [place] for review

## 2013-12-08 NOTE — ED Provider Notes (Signed)
CSN: [id number]     Arrival date & time 12/08/13  1937 History   First MD Initiated Contact with Patient 12/08/13 2013     Chief Complaint  Patient presents with  . Shortness of Breath     (Consider location/radiation/quality/duration/timing/severity/associated sxs/prior Treatment) HPI  Patient brought in by family with cough productive of clear sputum, SOB, increased work of breathing, increased HR, chills that began this afternoon.  Family at home noted patient's O2 mid 30Z, SW109, systolic BP 323.  Denies any allergies or environmental exposures, pt has not been sick for feeling any URI symptoms.  Has felt occasional chill and generally weak and tired over the past few weeks but has been keeping up with his normal activities, swimming several times/week, etc.  Denies recent immobilization or leg swelling. He has no known lung problems.  Has never wheezed before per family. Did have recent Mohs surgery a few weeks ago that only lasted about 1 hour, not under general anesthesia.    Past Medical History  Diagnosis Date  . Hyperlipidemia   . BPH (benign prostatic hyperplasia)   . Lymphoma   . Arthritis   . History of blood transfusion   . Pneumonia    Past Surgical History  Procedure Laterality Date  . Appendectomy    . Athroscopic knee surgery    . Mohs surgery      x 6  . Dental implants    . Green light laser turp (transurethral resection of prostate  08/29/2012    Procedure: GREEN LIGHT LASER TURP (TRANSURETHRAL RESECTION OF PROSTATE;  Surgeon: [name], MD;  Location: [facility];  Service: Urology;  Laterality: N/A;   Family History  Problem Relation Age of Onset  . CAD Mother    History  Substance Use Topics  . Smoking status: Former Research scientist (life sciences)  . Smokeless tobacco: Never Used  . Alcohol Use: Yes     Comment: Rarely    Review of Systems  Constitutional: Positive for chills. Negative for fever.  Respiratory: Positive for cough, shortness of breath and wheezing.    Cardiovascular: Negative for chest pain.  Gastrointestinal: Negative for nausea, vomiting, abdominal pain and diarrhea.  Genitourinary: Negative for dysuria, urgency and frequency.  All other systems reviewed and are negative.     Allergies  Review of patient's allergies indicates no known allergies.  Home Medications   Prior to Admission medications   Medication Sig Start Date End Date Taking? Authorizing Provider  aspirin 81 MG tablet Take 81 mg by mouth daily.    Historical Provider, MD  cholecalciferol (VITAMIN D) 1000 UNITS tablet Take 1,000 Units by mouth daily.    Historical Provider, MD  Cyanocobalamin (VITAMIN B-12) 2500 MCG SUBL Place 2,500 mcg under the tongue daily.    Historical Provider, MD  fish oil-omega-3 fatty acids 1000 MG capsule Take 2 g by mouth daily.    Historical Provider, MD  glucosamine-chondroitin 500-400 MG tablet Take 1 tablet by mouth daily.    Historical Provider, MD  Methylsulfonylmethane (MSM) 500 MG CAPS Take 1 capsule by mouth daily.    Historical Provider, MD  rOPINIRole (REQUIP) 3 MG tablet Take 3 mg by mouth at bedtime.    Historical Provider, MD  simvastatin (ZOCOR) 40 MG tablet Take 40 mg by mouth daily before breakfast.     Historical Provider, MD  sulfamethoxazole-trimethoprim (BACTRIM DS,SEPTRA DS) 800-160 MG per tablet Take 1 tablet by mouth 2 (two) times daily. 11/21/12   Leandrew Koyanagi, MD  traMADol-acetaminophen (ULTRACET) 37.5-325 MG per tablet Take 1 tablet by mouth every 6 (six) hours as needed for pain. 08/29/12   [name], MD   BP 122/55  Pulse 110  Temp(Src) 102 F (38.9 C) (Rectal)  Resp 16  Ht 5\' 10"  (1.778 m)  Wt 208 lb (94.348 kg)  BMI 29.84 kg/m2  SpO2 93% Physical Exam  Nursing note and vitals reviewed. Constitutional: He appears well-developed and well-nourished. No distress.  HENT:  Head: Normocephalic and atraumatic.  Neck: Neck supple.  Cardiovascular: Normal rate and regular rhythm.    Pulmonary/Chest: Accessory muscle usage present. No stridor. Tachypnea noted. No respiratory distress. He has no wheezes. He has rales.  Abdominal: Soft. He exhibits no distension and no mass. There is no tenderness. There is no rebound and no guarding.  Musculoskeletal: He exhibits no edema.  Neurological: He is alert. He exhibits normal muscle tone.  Skin: He is not diaphoretic.  Psychiatric: He has a normal mood and affect. His behavior is normal.    ED Course  Procedures (including critical care time) Labs Review Labs Reviewed  CBC WITH DIFFERENTIAL - Abnormal; Notable for the following:    WBC 18.7 (*)    RBC 4.05 (*)    Neutrophils Relative % 91 (*)    Neutro Abs 16.9 (*)    Lymphocytes Relative 4 (*)    All other components within normal limits  COMPREHENSIVE METABOLIC PANEL - Abnormal; Notable for the following:    Glucose, Bld 147 (*)    Albumin 3.4 (*)    GFR calc non Af Amer 52 (*)    GFR calc Af Amer 60 (*)    All other components within normal limits  TROPONIN I    Imaging Review Dg Chest 2 View  12/08/2013   CLINICAL DATA:  Shortness of breath.  EXAM: CHEST  2 VIEW  COMPARISON:  DG CHEST 1V PORT dated 12/08/2013  FINDINGS: The lungs are adequately inflated. The interstitial markings remain increased but are slightly less conspicuous than on the earlier portable study. Subtle near confluent density in the right upper lobe persists. The cardiac silhouette is not enlarged. The pulmonary vascularity is not engorged. There is no pleural effusion.  IMPRESSION: Increased pulmonary interstitial markings bilaterally likely reflects interstitial pneumonia. There is no significant pulmonary vascular congestion nor evidence of true enlargement of the cardiac silhouette.   Electronically Signed   By: David  Martinique   On: 12/08/2013 22:49   Dg Chest Portable 1 View  12/08/2013   CLINICAL DATA:  Wheezing and shortness of breath  EXAM: PORTABLE CHEST - 1 VIEW  COMPARISON:  None.   FINDINGS: The lungs are well-expanded. The pulmonary interstitial markings are increased diffusely. There is subtle confluent density in the right upper lobe The cardiopericardial silhouette is top-normal in size. The pulmonary vascularity is not clearly engorged. There is no pleural effusion. The mediastinum is normal in width. There is mild tortuosity of the descending thoracic aorta.  IMPRESSION: Increased interstitial markings bilaterally may reflect interstitial edema of cardiac or noncardiac calls. Interstitial pneumonia is in the differential as well. There is no alveolar pneumonia demonstrated. When the patient can tolerate the procedure, a PA and lateral chest x-ray would be of value.   Electronically Signed   By: David  Martinique   On: 12/08/2013 21:34     EKG Interpretation   Date/Time:  Friday Dec 08 2013 19:50:17 EDT Ventricular Rate:  109 PR Interval:  129 QRS Duration: 152 QT Interval:  465 QTC Calculation: 626 R Axis:   -69 Text Interpretation:  Sinus tachycardia RBBB and LAFB Left ventricular  hypertrophy Confirmed by Alvino Chapel  MD, Ovid Curd [phone]) on 12/08/2013  7:54:50 PM      8:58 PM Discussed pt with Dr Betsey Holiday who will also see the patient.    Filed Vitals:   12/08/13 2010  BP:   Pulse:   Temp: 102 F (38.9 C)  Resp:     Filed Vitals:   12/08/13 2300  BP: 100/47  Pulse: 100  Temp:   Resp: 28     MDM   Final diagnoses:  CAP (community acquired pneumonia)    Febrile patient with increased work of breathing developed cough and SOB today.  Oxygen saturation is documented 88% on room air in ED, per family was in "mid80s" at home.  Febrile to 102.  Tachypneic and working to breathe while in ED.  Placed on [place] - on 4L at time of admission with O2 95%.  CXR shows interstitial pneumonia.  Started on rocephin and azithromycin.  Considered pulmonary embolism and cardiac causes of symptoms, but constellation of symptoms is reflected in results and diagnosis of  pneumonia.  WBC 18.7.  Labs otherwise unremarkable.  Tachycardic, hypoxic, BP decreasing while in ED, have started IVF.   Admitted to [facility], Dr Arnoldo Morale.  Placed on stepdown.        Clayton Bibles, PA-C 12/08/13 2352

## 2013-12-09 ENCOUNTER — Encounter (HOSPITAL_COMMUNITY): Payer: Self-pay | Admitting: Internal Medicine

## 2013-12-09 DIAGNOSIS — R0603 Acute respiratory distress: Secondary | ICD-10-CM | POA: Diagnosis present

## 2013-12-09 DIAGNOSIS — J189 Pneumonia, unspecified organism: Secondary | ICD-10-CM

## 2013-12-09 DIAGNOSIS — A419 Sepsis, unspecified organism: Secondary | ICD-10-CM | POA: Diagnosis present

## 2013-12-09 DIAGNOSIS — E119 Type 2 diabetes mellitus without complications: Secondary | ICD-10-CM

## 2013-12-09 DIAGNOSIS — E039 Hypothyroidism, unspecified: Secondary | ICD-10-CM | POA: Diagnosis present

## 2013-12-09 DIAGNOSIS — G2581 Restless legs syndrome: Secondary | ICD-10-CM | POA: Diagnosis present

## 2013-12-09 DIAGNOSIS — R6520 Severe sepsis without septic shock: Secondary | ICD-10-CM

## 2013-12-09 DIAGNOSIS — E1129 Type 2 diabetes mellitus with other diabetic kidney complication: Secondary | ICD-10-CM

## 2013-12-09 DIAGNOSIS — R6521 Severe sepsis with septic shock: Secondary | ICD-10-CM

## 2013-12-09 DIAGNOSIS — D72829 Elevated white blood cell count, unspecified: Secondary | ICD-10-CM | POA: Diagnosis present

## 2013-12-09 DIAGNOSIS — E785 Hyperlipidemia, unspecified: Secondary | ICD-10-CM

## 2013-12-09 DIAGNOSIS — M1990 Unspecified osteoarthritis, unspecified site: Secondary | ICD-10-CM | POA: Insufficient documentation

## 2013-12-09 DIAGNOSIS — I498 Other specified cardiac arrhythmias: Secondary | ICD-10-CM

## 2013-12-09 DIAGNOSIS — I959 Hypotension, unspecified: Secondary | ICD-10-CM | POA: Diagnosis present

## 2013-12-09 DIAGNOSIS — R000 Tachycardia, unspecified: Secondary | ICD-10-CM | POA: Diagnosis present

## 2013-12-09 DIAGNOSIS — R0989 Other specified symptoms and signs involving the circulatory and respiratory systems: Secondary | ICD-10-CM

## 2013-12-09 DIAGNOSIS — R0609 Other forms of dyspnea: Secondary | ICD-10-CM

## 2013-12-09 LAB — CBC
HCT: 32.6 % — ABNORMAL LOW (ref 39.0–52.0)
HEMATOCRIT: 32 % — AB (ref 39.0–52.0)
HEMOGLOBIN: 11 g/dL — AB (ref 13.0–17.0)
HEMOGLOBIN: 10.5 g/dL — AB (ref 13.0–17.0)
MCH: 33 pg (ref 26.0–34.0)
MCH: 32.5 pg (ref 26.0–34.0)
MCHC: 33.7 g/dL (ref 30.0–36.0)
MCHC: 32.8 g/dL (ref 30.0–36.0)
MCV: 97.9 fL (ref 78.0–100.0)
MCV: 99.1 fL (ref 78.0–100.0)
Platelets: 280 10*3/uL (ref 150–400)
Platelets: 267 10*3/uL (ref 150–400)
RBC: 3.33 MIL/uL — AB (ref 4.22–5.81)
RBC: 3.23 MIL/uL — AB (ref 4.22–5.81)
RDW: 12.9 % (ref 11.5–15.5)
RDW: 13 % (ref 11.5–15.5)
WBC: 26.2 10*3/uL — ABNORMAL HIGH (ref 4.0–10.5)
WBC: 23 10*3/uL — AB (ref 4.0–10.5)

## 2013-12-09 LAB — PRO B NATRIURETIC PEPTIDE: PRO B NATRI PEPTIDE: 374.6 pg/mL (ref 0–450)

## 2013-12-09 LAB — BASIC METABOLIC PANEL
BUN: 24 mg/dL — ABNORMAL HIGH (ref 6–23)
CO2: 25 mEq/L (ref 19–32)
Calcium: 8.6 mg/dL (ref 8.4–10.5)
Chloride: 100 mEq/L (ref 96–112)
Creatinine, Ser: 1.19 mg/dL (ref 0.50–1.35)
GFR calc non Af Amer: 54 mL/min — ABNORMAL LOW (ref >90)
GFR, EST AFRICAN AMERICAN: 62 mL/min — AB (ref >90)
Glucose, Bld: 173 mg/dL — ABNORMAL HIGH (ref 70–99)
POTASSIUM: 4.1 meq/L (ref 3.7–5.3)
SODIUM: 136 meq/L — AB (ref 137–147)

## 2013-12-09 LAB — MRSA PCR SCREENING: MRSA by PCR: NEGATIVE

## 2013-12-09 LAB — LACTIC ACID, PLASMA: LACTIC ACID, VENOUS: 2.1 mmol/L (ref 0.5–2.2)

## 2013-12-09 MED ORDER — ROPINIROLE HCL 1 MG PO TABS
3.0000 mg | ORAL_TABLET | Freq: Every day | ORAL | Status: DC
  Administered 2013-12-09 – 2013-12-10 (×2): 3 mg via ORAL
  Filled 2013-12-09 (×4): qty 3

## 2013-12-09 MED ORDER — DEXTROSE 5 % IV SOLN
500.0000 mg | INTRAVENOUS | Status: DC
  Administered 2013-12-09 – 2013-12-10 (×2): 500 mg via INTRAVENOUS
  Filled 2013-12-09 (×4): qty 500

## 2013-12-09 MED ORDER — OMEGA-3-ACID ETHYL ESTERS 1 G PO CAPS
2.0000 g | ORAL_CAPSULE | Freq: Every day | ORAL | Status: DC
  Administered 2013-12-09 – 2013-12-11 (×3): 2 g via ORAL
  Filled 2013-12-09 (×3): qty 2

## 2013-12-09 MED ORDER — ALBUTEROL SULFATE (2.5 MG/3ML) 0.083% IN NEBU: 2.5000 mg | INHALATION_SOLUTION | Freq: Four times a day (QID) | RESPIRATORY_TRACT | Status: DC | PRN

## 2013-12-09 MED ORDER — ADULT MULTIVITAMIN W/MINERALS CH
1.0000 | ORAL_TABLET | Freq: Every day | ORAL | Status: DC
  Administered 2013-12-09 – 2013-12-11 (×3): 1 via ORAL
  Filled 2013-12-09 (×3): qty 1

## 2013-12-09 MED ORDER — SIMVASTATIN 40 MG PO TABS
40.0000 mg | ORAL_TABLET | Freq: Every day | ORAL | Status: DC
  Administered 2013-12-09 – 2013-12-10 (×2): 40 mg via ORAL
  Filled 2013-12-09 (×3): qty 1

## 2013-12-09 MED ORDER — ALBUTEROL SULFATE (2.5 MG/3ML) 0.083% IN NEBU: 2.5000 mg | INHALATION_SOLUTION | RESPIRATORY_TRACT | Status: DC | PRN

## 2013-12-09 MED ORDER — SODIUM CHLORIDE 0.9 % IV BOLUS (SEPSIS)
500.0000 mL | Freq: Once | INTRAVENOUS | Status: AC
  Administered 2013-12-09: 500 mL via INTRAVENOUS

## 2013-12-09 MED ORDER — SODIUM CHLORIDE 0.9 % IV SOLN
INTRAVENOUS | Status: AC
  Administered 2013-12-09: 1000 mL via INTRAVENOUS

## 2013-12-09 MED ORDER — ENOXAPARIN SODIUM 40 MG/0.4ML ~~LOC~~ SOLN
40.0000 mg | SUBCUTANEOUS | Status: DC
  Administered 2013-12-09 – 2013-12-11 (×3): 40 mg via SUBCUTANEOUS
  Filled 2013-12-09 (×3): qty 0.4

## 2013-12-09 MED ORDER — DEXTROSE 5 % IV SOLN
1.0000 g | INTRAVENOUS | Status: DC
  Administered 2013-12-09 – 2013-12-10 (×2): 1 g via INTRAVENOUS
  Filled 2013-12-09 (×4): qty 10

## 2013-12-09 MED ORDER — ALBUTEROL SULFATE (2.5 MG/3ML) 0.083% IN NEBU
2.5000 mg | INHALATION_SOLUTION | Freq: Four times a day (QID) | RESPIRATORY_TRACT | Status: DC
  Administered 2013-12-09: 2.5 mg via RESPIRATORY_TRACT
  Filled 2013-12-09: qty 3

## 2013-12-09 MED ORDER — ASPIRIN EC 81 MG PO TBEC
81.0000 mg | DELAYED_RELEASE_TABLET | Freq: Every day | ORAL | Status: DC
  Administered 2013-12-09 – 2013-12-11 (×3): 81 mg via ORAL
  Filled 2013-12-09 (×3): qty 1

## 2013-12-09 MED ORDER — LEVOTHYROXINE SODIUM 100 MCG PO TABS
100.0000 ug | ORAL_TABLET | Freq: Every day | ORAL | Status: DC
  Administered 2013-12-09 – 2013-12-11 (×3): 100 ug via ORAL
  Filled 2013-12-09 (×4): qty 1

## 2013-12-09 MED ORDER — GLUCOSAMINE-CHONDROITIN 500-400 MG PO TABS: 1.0000 | ORAL_TABLET | Freq: Every day | ORAL | Status: DC

## 2013-12-09 MED ORDER — ALBUTEROL SULFATE (2.5 MG/3ML) 0.083% IN NEBU: 5.0000 mg | INHALATION_SOLUTION | RESPIRATORY_TRACT | Status: DC | PRN

## 2013-12-09 NOTE — ED Notes (Signed)
Respiratory at bedside.

## 2013-12-09 NOTE — Progress Notes (Signed)
[name]  Travis Bradford NUU:[id number] DOB: Sep 27, 1925 DOA: 12/08/2013 PCP: No PCP Per Patient  Assessment/Plan: Principal Problem:   Septic shock: Patient meets criteria given hypoxemia, hypotension, tachycardia and markedly leukocytosis. Stabilized. Transfer to floor.: Secondary to septic shock. Stable now. Continue IV fluids and transfer to floor. Check blood pressure more often. Source is pneumonia.  Active Problems:   Hyperlipidemia: Stable. On statin.    Diabetes controlled with renal dysfunction: Currently on sliding scale, monitor CBGs.    CAP (community acquired pneumonia): Source of sepsis. Continue IV antibiotics.    Leukocytosis: Increase from initially on admission. Recheck this afternoon.    Sinus tachycardia: Secondary sepsis. Improved.    Respiratory distress   Hypotension: Secondary to shock. Improved.  Restless leg: On Requip Hypothyroidism: continue Synthroid   Code Status: Full code as confirmed by patient Family Communication: Left message with some by phone Disposition Plan: Stable. Transfer to floor.   Consultants:  None  Procedures:  None  Antibiotics:  IV Rocephin 5/15-present  IV Zithromax 5/15-present  HPI/Subjective: Patient doing well. Feeling better. No complaints. Denies any shortness of breath  Objective: Filed Vitals:   12/09/13 0800  BP: 107/41  Pulse: 65  Temp: 97.9 F (36.6 C)  Resp: 7    Intake/Output Summary (Last 24 hours) at 12/09/13 1044 Last data filed at 12/09/13 1000  Gross per 24 hour  Intake   1150 ml  Output    200 ml  Net    950 ml   Filed Weights   12/08/13 1942 12/09/13 0055  Weight: 94.348 kg (208 lb) 97.4 kg (214 lb 11.7 oz)    Exam:   General:  Chronically hard of hearing, otherwise alert and oriented x3, no acute distress  Cardiovascular: Regular rate and rhythm, S1-S2  Respiratory: Clear to auscultation bilaterally  Abdomen: Soft, nontender, nondistended,  positive bowel sounds  Musculoskeletal: No clubbing or cyanosis or edema   Data Reviewed: Basic Metabolic Panel:  Recent Labs Lab 12/08/13 2021 12/09/13 0931  NA 137 136*  K 4.4 4.1  CL 99 100  CO2 25 25  GLUCOSE 147* 173*  BUN 23 24*  CREATININE 1.23 1.19  CALCIUM 9.5 8.6   Liver Function Tests:  Recent Labs Lab 12/08/13 2021  AST 19  ALT 16  ALKPHOS 60  BILITOT 0.3  PROT 7.2  ALBUMIN 3.4*   No results found for this basename: LIPASE, AMYLASE,  in the last 168 hours No results found for this basename: AMMONIA,  in the last 168 hours CBC:  Recent Labs Lab 12/08/13 2021 12/09/13 0931  WBC 18.7* 26.2*  NEUTROABS 16.9*  --   HGB 13.1 11.0*  HCT 39.8 32.6*  MCV 98.3 97.9  PLT 305 280   Cardiac Enzymes:  Recent Labs Lab 12/08/13 2021  TROPONINI <0.30   BNP (last 3 results)  Recent Labs  12/09/13 0125  PROBNP 374.6   CBG: No results found for this basename: GLUCAP,  in the last 168 hours  Recent Results (from the past 240 hour(s))  MRSA PCR SCREENING     Status: None   Collection Time    12/09/13  1:10 AM      Result Value Ref Range Status   MRSA by PCR NEGATIVE  NEGATIVE Final   Comment:            The GeneXpert MRSA Assay (FDA     approved for NASAL specimens     only), is one component of a  comprehensive MRSA colonization     surveillance program. It is not     intended to diagnose MRSA     infection nor to guide or     monitor treatment for     MRSA infections.     Studies: Dg Chest 2 View  12/08/2013   CLINICAL DATA:  Shortness of breath.  EXAM: CHEST  2 VIEW  COMPARISON:  DG CHEST 1V PORT dated 12/08/2013  FINDINGS: The lungs are adequately inflated. The interstitial markings remain increased but are slightly less conspicuous than on the earlier portable study. Subtle near confluent density in the right upper lobe persists. The cardiac silhouette is not enlarged. The pulmonary vascularity is not engorged. There is no pleural  effusion.  IMPRESSION: Increased pulmonary interstitial markings bilaterally likely reflects interstitial pneumonia. There is no significant pulmonary vascular congestion nor evidence of true enlargement of the cardiac silhouette.   Electronically Signed   By: David  Martinique   On: 12/08/2013 22:49   Dg Chest Portable 1 View  12/08/2013   CLINICAL DATA:  Wheezing and shortness of breath  EXAM: PORTABLE CHEST - 1 VIEW  COMPARISON:  None.  FINDINGS: The lungs are well-expanded. The pulmonary interstitial markings are increased diffusely. There is subtle confluent density in the right upper lobe The cardiopericardial silhouette is top-normal in size. The pulmonary vascularity is not clearly engorged. There is no pleural effusion. The mediastinum is normal in width. There is mild tortuosity of the descending thoracic aorta.  IMPRESSION: Increased interstitial markings bilaterally may reflect interstitial edema of cardiac or noncardiac calls. Interstitial pneumonia is in the differential as well. There is no alveolar pneumonia demonstrated. When the patient can tolerate the procedure, a PA and lateral chest x-ray would be of value.   Electronically Signed   By: David  Martinique   On: 12/08/2013 21:34    Scheduled Meds: . sodium chloride   Intravenous STAT  . albuterol  2.5 mg Nebulization Q6H  . aspirin EC  81 mg Oral Daily  . azithromycin  500 mg Intravenous Q24H  . cefTRIAXone (ROCEPHIN)  IV  1 g Intravenous Q24H  . enoxaparin (LOVENOX) injection  40 mg Subcutaneous Q24H  . levothyroxine  100 mcg Oral QAC breakfast  . multivitamin with minerals  1 tablet Oral Daily  . omega-3 acid ethyl esters  2 g Oral Daily  . rOPINIRole  3 mg Oral QHS  . simvastatin  40 mg Oral q1800   Continuous Infusions:   Principal Problem:   Septic shock Active Problems:   Hyperlipidemia   Diabetes   CAP (community acquired pneumonia)   Leukocytosis   Sinus tachycardia   Respiratory distress   Hypotension    Time  spent: 35 min    [facility] Pager [phone]. If 7PM-7AM, please contact night-coverage at [email or web address], password [facility] 12/09/2013, 10:44 AM  LOS: 1 day

## 2013-12-09 NOTE — Evaluation (Signed)
Physical Therapy Evaluation Patient Details Name: Travis Bradford MRN: [id number] DOB: 1926/02/09 Today's Date: 12/09/2013   History of Present Illness  Pt is an 80 y.o. male with a recent diagnosis of Hypothyroidism, and PMHx of Hyperlipidemia and borderline Type 2 DM, and Prostate Cancer S/P Laser TURP, restless legs syndrome admitted 5/15 with complaints of night sweats and chills nightly for 1 week.  Pt found to be hypoxic and hypotensive and with probable diagnosis of septic shock.  Clinical Impression  Pt currently with functional limitations due to the deficits listed below (see PT Problem List).  Pt will benefit from skilled PT to increase their independence and safety with mobility to allow discharge to the venue listed below.  Pt reports feeling lousy however much better since admission.  Pt ambulated in hallway and denies dizziness.  Pt will likely progress back to baseline upon d/c.  Son present and reports pt does not have PCP and pt self adjusts dosages of his medicine.      Follow Up Recommendations No PT follow up    Equipment Recommendations  None recommended by PT    Recommendations for Other Services       Precautions / Restrictions Precautions Precautions: Fall      Mobility  Bed Mobility Overal bed mobility: Modified Independent                Transfers Overall transfer level: Needs assistance   Transfers: Sit to/from Stand Sit to Stand: Min guard         General transfer comment: min/guard for safety, pt has been hypotensive, no dizziness with mobility however  Ambulation/Gait Ambulation/Gait assistance: Min guard Ambulation Distance (Feet): 400 Feet Assistive device: None Gait Pattern/deviations: Step-through pattern;Drifts right/left;Narrow base of support     General Gait Details: slightly unsteady at times however pt self corrected, pt denies dizziness  Stairs            Wheelchair Mobility    Modified Rankin (Stroke Patients  Only)       Balance                                             Pertinent Vitals/Pain No specific pain reported. HR 73 bpm and SpO2 95% room air after ambulation    Home Living Family/patient expects to be discharged to:: Private residence Living Arrangements: Alone Available Help at Discharge: Family;Available PRN/intermittently Type of Home: House Home Access: Stairs to enter   [employer] of Steps: "a couple" Home Layout: One level Home Equipment: None      Prior Function Level of Independence: Independent               Hand Dominance        Extremity/Trunk Assessment               Lower Extremity Assessment: Generalized weakness         Communication   Communication: No difficulties  Cognition Arousal/Alertness: Awake/alert Behavior During Therapy: WFL for tasks assessed/performed Overall Cognitive Status: Within Functional Limits for tasks assessed                      General Comments      Exercises        Assessment/Plan    PT Assessment Patient needs continued PT services  PT Diagnosis Difficulty walking   PT Problem  List Decreased strength;Decreased mobility;Decreased activity tolerance  PT Treatment Interventions DME instruction;Gait training;Functional mobility training;Therapeutic exercise;Therapeutic activities;Patient/family education   PT Goals (Current goals can be found in the Care Plan section) Acute Rehab PT Goals PT Goal Formulation: With patient/family Time For Goal Achievement: 12/16/13 Potential to Achieve Goals: Good    Frequency Min 3X/week   Barriers to discharge        Co-evaluation               End of Session   Activity Tolerance: Patient tolerated treatment well Patient left: in chair;with call bell/phone within reach Nurse Communication: Mobility status         Time: 9767-3419 PT Time Calculation (min): 16 min   Charges:   PT  Evaluation $Initial PT Evaluation Tier I: 1 Procedure PT Treatments $Gait Training: 8-22 mins   PT G CodesJunius Argyle 12/09/2013, 3:47 PM Carmelia Bake, PT, DPT 12/09/2013 Pager: [phone]

## 2013-12-09 NOTE — Progress Notes (Signed)
Report called to [place], Therapist, sports.  Patient transferring to room 1332 via wheelchair.

## 2013-12-09 NOTE — H&P (Signed)
[facility] History and Physical  Travis Bradford UVO:[id number] DOB: 30-Aug-1925 DOA: 12/08/2013  Referring physician: EDP PCP: No PCP Per Patient  Specialists:   Chief Complaint: Fevers Chills, Cough and Worsening SOB  HPI: Travis Bradford is a 80 y.o. male with a recent diagnosis of Hypothyroidism, and history of Hyperlipidemia and borderline Type 2 DM, and  Prostate Cancer S/P Laser TURP with remote history of NHL 15 years ago now in remission who presents to the ED with complaints of night sweats and chills nightly x 1 week and fevers and increased non[productive cough and wheezing x 1 days.  He was found to have a temperature in the ED of 102. At home his son had found his oxygen level to be in the mid 80's and he was taken to the ED.   In the ED he was placed on 4 liters [place] O2 and had improvement in his oxygenation to 95%.   He was evaluated and found to have a a multifocal pneumonia on Chest X-ray.and was place don IV Rocephin and Azithromycin to cover CAP.      Review of Systems:  Constitutional: No Weight Loss, No Weight Gain, +Night Sweats, +Fevers, +Chills, Fatigue, or Generalized Weakness HEENT: No Headaches, Difficulty Swallowing,Tooth/Dental Problems,Sore Throat,  No Sneezing, Rhinitis, Ear Ache, Nasal Congestion, or Post Nasal Drip,  Cardio-vascular:  No Chest pain, Orthopnea, PND, Edema in lower extremities, Anasarca, Dizziness, Palpitations  Resp: +Dyspnea, No DOE, +Non-Productive Cough, No Hemoptysis, +Wheezing.    GI: No Heartburn, Indigestion, Abdominal Pain, Nausea, Vomiting, Diarrhea, Change in Bowel Habits,  Loss of Appetite  GU: No Dysuria, Change in Color of Urine, No Urgency or Frequency.  No Flank pain.  Musculoskeletal: No Joint Pain or Swelling.  No Decreased Range of Motion. No Back Pain.  Neurologic: No Syncope, No Seizures, Muscle Weakness, Paresthesia, Vision Disturbance or Loss, No Diplopia, No Vertigo, No Difficulty Walking,  Skin: No Rash or  Lesions. Psych: No Change in Mood or Affect. No Depression or Anxiety. No Memory loss. No Confusion or Hallucinations   Past Medical History  Diagnosis Date  . Hyperlipidemia   . BPH (benign prostatic hyperplasia)   . Lymphoma   . Arthritis   . History of blood transfusion   . Pneumonia       Past Surgical History  Procedure Laterality Date  . Appendectomy    . Athroscopic knee surgery    . Mohs surgery      x 6  . Dental implants    . Green light laser turp (transurethral resection of prostate  08/29/2012    Procedure: GREEN LIGHT LASER TURP (TRANSURETHRAL RESECTION OF PROSTATE;  Surgeon: [name], MD;  Location: [facility];  Service: Urology;  Laterality: N/A;      Prior to Admission medications   Medication Sig Start Date End Date Taking? Authorizing Provider  aspirin 81 MG tablet Take 81 mg by mouth daily.   Yes Historical Provider, MD  fish oil-omega-3 fatty acids 1000 MG capsule Take 2 g by mouth daily.   Yes Historical Provider, MD  glucosamine-chondroitin 500-400 MG tablet Take 1 tablet by mouth daily.   Yes Historical Provider, MD  levothyroxine (SYNTHROID, LEVOTHROID) 100 MCG tablet Take 100 mcg by mouth daily.   Yes Historical Provider, MD  Methylsulfonylmethane (MSM) 500 MG CAPS Take 1 capsule by mouth daily.   Yes Historical Provider, MD  Multiple Vitamin (MULTIVITAMIN WITH MINERALS) TABS tablet Take 1 tablet by mouth daily.   Yes Historical Provider,  MD  rOPINIRole (REQUIP) 3 MG tablet Take 3 mg by mouth at bedtime.   Yes Historical Provider, MD  simvastatin (ZOCOR) 40 MG tablet Take 40 mg by mouth daily before breakfast.    Yes Historical Provider, MD    No Known Allergies   Social History: Swims Twice a week, and is able to perform all of his ADLs  reports that he has quit smoking. He has never used smokeless tobacco. He reports that he drinks alcohol. His drug history is not on file.     Family History  Problem Relation Age of Onset  . CAD  Mother        Physical Exam:  GEN:  Pleasant  Elderly Obese  80 y.o. Caucasian male  examined  and in no acute distress; cooperative with exam Filed Vitals:   12/08/13 2345 12/09/13 0000 12/09/13 0009 12/09/13 0010  BP: 102/49 103/49    Pulse: 86 84    Temp:   98.5 F (36.9 C)   TempSrc:   Oral   Resp: 33 31    Height:      Weight:      SpO2: 96% 97%  97%   Blood pressure 103/49, pulse 84, temperature 98.5 F (36.9 C), temperature source Oral, resp. rate 31, height 5\' 10"  (1.778 m), weight 94.348 kg (208 lb), SpO2 97.00%. PSYCH: He is alert and oriented x4; does not appear anxious does not appear depressed; affect is normal HEENT: Normocephalic and Atraumatic, Mucous membranes pink; PERRLA; EOM intact; Fundi:  Benign;  No scleral icterus, Nares: Patent, Oropharynx: Clear, Fair Dentition, Neck:  FROM, no cervical lymphadenopathy nor thyromegaly or carotid bruit; no JVD; Breasts:: Not examined CHEST WALL: No tenderness CHEST: +Tachypnea,  And Decreased Breath Sounds Bilaterally , NO Rales , NO Rhonchi or Wheezes,   HEART: Regular rate and rhythm; no murmurs rubs or gallops BACK: No kyphosis or scoliosis; no CVA tenderness ABDOMEN: Positive Bowel Sounds, Obese, soft non-tender; no masses, no organomegaly. Rectal Exam: Not done EXTREMITIES: No cyanosis, clubbing or edema; no ulcerations. Genitalia: not examined PULSES: 2+ and symmetric SKIN: Normal hydration no rash or ulceration CNS:  Alert and Oriented x 4,  No Focal Deficits,  +HOH.    Vascular: pulses palpable throughout     Labs on Admission:  Basic Metabolic Panel:  Recent Labs Lab 12/08/13 2021  NA 137  K 4.4  CL 99  CO2 25  GLUCOSE 147*  BUN 23  CREATININE 1.23  CALCIUM 9.5   Liver Function Tests:  Recent Labs Lab 12/08/13 2021  AST 19  ALT 16  ALKPHOS 60  BILITOT 0.3  PROT 7.2  ALBUMIN 3.4*   No results found for this basename: LIPASE, AMYLASE,  in the last 168 hours No results found for this  basename: AMMONIA,  in the last 168 hours CBC:  Recent Labs Lab 12/08/13 2021  WBC 18.7*  NEUTROABS 16.9*  HGB 13.1  HCT 39.8  MCV 98.3  PLT 305   Cardiac Enzymes:  Recent Labs Lab 12/08/13 2021  TROPONINI <0.30    BNP (last 3 results) No results found for this basename: PROBNP,  in the last 8760 hours CBG: No results found for this basename: GLUCAP,  in the last 168 hours  Radiological Exams on Admission: Dg Chest 2 View  12/08/2013   CLINICAL DATA:  Shortness of breath.  EXAM: CHEST  2 VIEW  COMPARISON:  DG CHEST 1V PORT dated 12/08/2013  FINDINGS: The lungs are adequately inflated. The  interstitial markings remain increased but are slightly less conspicuous than on the earlier portable study. Subtle near confluent density in the right upper lobe persists. The cardiac silhouette is not enlarged. The pulmonary vascularity is not engorged. There is no pleural effusion.  IMPRESSION: Increased pulmonary interstitial markings bilaterally likely reflects interstitial pneumonia. There is no significant pulmonary vascular congestion nor evidence of true enlargement of the cardiac silhouette.   Electronically Signed   By: David  Martinique   On: 12/08/2013 22:49   Dg Chest Portable 1 View  12/08/2013   CLINICAL DATA:  Wheezing and shortness of breath  EXAM: PORTABLE CHEST - 1 VIEW  COMPARISON:  None.  FINDINGS: The lungs are well-expanded. The pulmonary interstitial markings are increased diffusely. There is subtle confluent density in the right upper lobe The cardiopericardial silhouette is top-normal in size. The pulmonary vascularity is not clearly engorged. There is no pleural effusion. The mediastinum is normal in width. There is mild tortuosity of the descending thoracic aorta.  IMPRESSION: Increased interstitial markings bilaterally may reflect interstitial edema of cardiac or noncardiac calls. Interstitial pneumonia is in the differential as well. There is no alveolar pneumonia  demonstrated. When the patient can tolerate the procedure, a PA and lateral chest x-ray would be of value.   Electronically Signed   By: David  Martinique   On: 12/08/2013 21:34      EKG: Independently reviewed. Sinus Tachycardia 109 +RBBB, and LAFB     Assessment/Plan:   80 y.o. male with  Principal Problem:   CAP (community acquired pneumonia) Active Problems:   Respiratory distress   Hyperlipidemia   Diabetes   Leukocytosis   Sinus tachycardia   Hypotension   Restless Leg Syndrome       1.   CAP-   Blood Cultures sent and placed on IV Rocephin and Azithromycin to cover CAP,  Also Albuterol Nebs, and O2 at  4 liters and monitoring O2 sats.      2.   Respiratory Distress-  Due to CAP, but may be a mixed picture, check BNP also.   Placed on 4 liters [place] O2, titrate PRN.     3.   Sinus Tachycardia- improved after  IV Fluid bolus, and O2.     4.   Hypotension-   Son states his BPs a normally low normal, monitor BPs.     5.   Leukocytosis-   Due to #1,   Reveal a left shift 91% Ns.  Monitor Trend.     6.   Diabetes- Diet and Exercise controlled, SSI coverage PRN, and check HbA1C.    7.   Hypothyroid-  Check TSH, continue Levothyroxine Rx.    8.   Hyperlipidemia- continue Zocor Rx.    9.  Restless Leg Syndrome- continue Ropirinole Rx.     9.  DVT prophylaxis with Lovenox.         Code Status:   FULL CODE    Family Communication:   Son at bedside Disposition Plan:  Inpatient Stepdown Bed       Time spent:  [facility] Pager [phone]  If 7PM-7AM, please contact night-coverage [email or web address] Password [facility] 12/09/2013, 12:33 AM

## 2013-12-09 NOTE — ED Provider Notes (Signed)
Medical screening examination/treatment/procedure(s) were conducted as a shared visit with non-physician practitioner(s) and myself.  I personally evaluated the patient during the encounter.   EKG Interpretation   Date/Time:  Friday Dec 08 2013 19:50:17 EDT Ventricular Rate:  109 PR Interval:  129 QRS Duration: 152 QT Interval:  465 QTC Calculation: 626 R Axis:   -69 Text Interpretation:  Sinus tachycardia RBBB and LAFB Left ventricular  hypertrophy Confirmed by Alvino Chapel  MD, Ovid Curd [phone]) on 12/08/2013  7:54:50 PM      Patient presents to the ER for evaluation of cough and shortness of breath. He is noted to be very tachypneic and hypoxic on arrival. He is also febrile. Workup was consistent with pneumonia. Patient will require hospitalization for further management.  Orpah Greek, MD 12/09/13 0002

## 2013-12-09 NOTE — ED Notes (Signed)
Attempted to call report, RN unavailable to take at this time. Floor RN to call back.

## 2013-12-10 DIAGNOSIS — R9431 Abnormal electrocardiogram [ECG] [EKG]: Secondary | ICD-10-CM

## 2013-12-10 LAB — BASIC METABOLIC PANEL
BUN: 22 mg/dL (ref 6–23)
CALCIUM: 9 mg/dL (ref 8.4–10.5)
CHLORIDE: 102 meq/L (ref 96–112)
CO2: 24 meq/L (ref 19–32)
CREATININE: 0.99 mg/dL (ref 0.50–1.35)
GFR calc Af Amer: 84 mL/min — ABNORMAL LOW (ref >90)
GFR calc non Af Amer: 73 mL/min — ABNORMAL LOW (ref >90)
GLUCOSE: 123 mg/dL — AB (ref 70–99)
Potassium: 4.4 mEq/L (ref 3.7–5.3)
Sodium: 137 mEq/L (ref 137–147)

## 2013-12-10 LAB — CBC
HCT: 32.3 % — ABNORMAL LOW (ref 39.0–52.0)
Hemoglobin: 10.6 g/dL — ABNORMAL LOW (ref 13.0–17.0)
MCH: 32.2 pg (ref 26.0–34.0)
MCHC: 32.8 g/dL (ref 30.0–36.0)
MCV: 98.2 fL (ref 78.0–100.0)
PLATELETS: 260 10*3/uL (ref 150–400)
RBC: 3.29 MIL/uL — ABNORMAL LOW (ref 4.22–5.81)
RDW: 13.2 % (ref 11.5–15.5)
WBC: 14 10*3/uL — ABNORMAL HIGH (ref 4.0–10.5)

## 2013-12-10 LAB — TSH: TSH: 3.92 u[IU]/mL (ref 0.350–4.500)

## 2013-12-10 LAB — HEMOGLOBIN A1C
Hgb A1c MFr Bld: 6.9 % — ABNORMAL HIGH (ref <5.7)
MEAN PLASMA GLUCOSE: 151 mg/dL — AB (ref <117)

## 2013-12-10 MED ORDER — MENTHOL 3 MG MT LOZG
1.0000 | LOZENGE | OROMUCOSAL | Status: DC | PRN
  Filled 2013-12-10: qty 9

## 2013-12-10 NOTE — Progress Notes (Signed)
[name]  Travis Bradford UDJ:[id number] DOB: Aug 31, 1925 DOA: 12/08/2013 PCP: No PCP Per Patient  Assessment/Plan: Principal Problem:   Septic shock: Patient meets criteria given hypoxemia, hypotension, tachycardia and markedly leukocytosis. Stabilized. BP maintaining off of fluids. Off of oxygen  Active Problems:   Hyperlipidemia: Stable. On statin.    Diabetes controlled with renal dysfunction: Currently on sliding scale, monitor CBGs. Check A1c. Patient is primary care physician, he goes to an urgent care    CAP (community acquired pneumonia): Source of sepsis. Continue IV antibiotics until white count normalized, likely tomorrow. Weaned off oxygen    Leukocytosis: Secondary to pneumonia. Improving.    Sinus tachycardia: Secondary sepsis from volume depletion. Resolved  Bradycardia: Patient's heart rate in the high 40s. Asymptomatic. Patient has had this issue previously before. He does not follow with a regular PCP and with the surgeon care. We'll check TSH as he is already on Synthroid. He is also on Requip at night which may be playing extremity factor  Abnormal EKG: Patient on admission had a left anterior fascicular block and right bundle branch block. EKG 15 months ago did not show this. EKG 15 months ago was done as part of the stress echo and cardiac workup to clear him for prostate surgery. We'll repeat EKG and if these new findings are persistent, will refer patient to cardiology as outpatient. BNP and troponin are normal on admission, so this is not anything immediately acute    Respiratory distress: Secondary pneumonia, resolved   Hypotension: Secondary to shock. Improved.  Restless leg: On Requip Hypothyroidism: continue Synthroid, checking TSH   Code Status: Full code as confirmed by patient Family Communication: Left message with the son by phone Disposition Plan: Stable. Home likely  tomorrow   Consultants:  None  Procedures:  None  Antibiotics:  IV Rocephin 5/15-present  IV Zithromax 5/15-present  HPI/Subjective: Patient doing well. Feels very worn out. Occasional cough, otherwise no complaints  Objective: Filed Vitals:   12/10/13 0838  BP: 117/53  Pulse: 48  Temp: 97.5 F (36.4 C)  Resp: 20    Intake/Output Summary (Last 24 hours) at 12/10/13 1136 Last data filed at 12/10/13 0900  Gross per 24 hour  Intake    720 ml  Output    225 ml  Net    495 ml   Filed Weights   12/09/13 0055 12/09/13 1110 12/09/13 1424  Weight: 97.4 kg (214 lb 11.7 oz) 97.8 kg (215 lb 9.8 oz) 97.8 kg (215 lb 9.8 oz)    Exam:   General:  Chronically hard of hearing, otherwise alert and oriented x3, fatigue  Cardiovascular: Regular rate and rhythm, S1-S2  Respiratory: Clear to auscultation bilaterally  Abdomen: Soft, nontender, nondistended, positive bowel sounds  Musculoskeletal: No clubbing or cyanosis or edema   Data Reviewed: Basic Metabolic Panel:  Recent Labs Lab 12/08/13 2021 12/09/13 0931 12/10/13 0525  NA 137 136* 137  K 4.4 4.1 4.4  CL 99 100 102  CO2 25 25 24   GLUCOSE 147* 173* 123*  BUN 23 24* 22  CREATININE 1.23 1.19 0.99  CALCIUM 9.5 8.6 9.0   Liver Function Tests:  Recent Labs Lab 12/08/13 2021  AST 19  ALT 16  ALKPHOS 60  BILITOT 0.3  PROT 7.2  ALBUMIN 3.4*   No results found for this basename: LIPASE, AMYLASE,  in the last 168 hours No results found for this basename: AMMONIA,  in the last 168 hours CBC:  Recent Labs Lab  12/08/13 2021 12/09/13 0931 12/09/13 1500 12/10/13 0525  WBC 18.7* 26.2* 23.0* 14.0*  NEUTROABS 16.9*  --   --   --   HGB 13.1 11.0* 10.5* 10.6*  HCT 39.8 32.6* 32.0* 32.3*  MCV 98.3 97.9 99.1 98.2  PLT 305 280 267 260   Cardiac Enzymes:  Recent Labs Lab 12/08/13 2021  TROPONINI <0.30   BNP (last 3 results)  Recent Labs  12/09/13 0125  PROBNP 374.6   CBG: No results found for  this basename: GLUCAP,  in the last 168 hours  Recent Results (from the past 240 hour(s))  MRSA PCR SCREENING     Status: None   Collection Time    12/09/13  1:10 AM      Result Value Ref Range Status   MRSA by PCR NEGATIVE  NEGATIVE Final   Comment:            The GeneXpert MRSA Assay (FDA     approved for NASAL specimens     only), is one component of a     comprehensive MRSA colonization     surveillance program. It is not     intended to diagnose MRSA     infection nor to guide or     monitor treatment for     MRSA infections.     Studies: Dg Chest 2 View  12/08/2013   CLINICAL DATA:  Shortness of breath.  EXAM: CHEST  2 VIEW  COMPARISON:  DG CHEST 1V PORT dated 12/08/2013  FINDINGS: The lungs are adequately inflated. The interstitial markings remain increased but are slightly less conspicuous than on the earlier portable study. Subtle near confluent density in the right upper lobe persists. The cardiac silhouette is not enlarged. The pulmonary vascularity is not engorged. There is no pleural effusion.  IMPRESSION: Increased pulmonary interstitial markings bilaterally likely reflects interstitial pneumonia. There is no significant pulmonary vascular congestion nor evidence of true enlargement of the cardiac silhouette.   Electronically Signed   By: David  Martinique   On: 12/08/2013 22:49   Dg Chest Portable 1 View  12/08/2013   CLINICAL DATA:  Wheezing and shortness of breath  EXAM: PORTABLE CHEST - 1 VIEW  COMPARISON:  None.  FINDINGS: The lungs are well-expanded. The pulmonary interstitial markings are increased diffusely. There is subtle confluent density in the right upper lobe The cardiopericardial silhouette is top-normal in size. The pulmonary vascularity is not clearly engorged. There is no pleural effusion. The mediastinum is normal in width. There is mild tortuosity of the descending thoracic aorta.  IMPRESSION: Increased interstitial markings bilaterally may reflect interstitial  edema of cardiac or noncardiac calls. Interstitial pneumonia is in the differential as well. There is no alveolar pneumonia demonstrated. When the patient can tolerate the procedure, a PA and lateral chest x-ray would be of value.   Electronically Signed   By: David  Martinique   On: 12/08/2013 21:34    Scheduled Meds: . aspirin EC  81 mg Oral Daily  . azithromycin  500 mg Intravenous Q24H  . cefTRIAXone (ROCEPHIN)  IV  1 g Intravenous Q24H  . enoxaparin (LOVENOX) injection  40 mg Subcutaneous Q24H  . levothyroxine  100 mcg Oral QAC breakfast  . multivitamin with minerals  1 tablet Oral Daily  . omega-3 acid ethyl esters  2 g Oral Daily  . rOPINIRole  3 mg Oral QHS  . simvastatin  40 mg Oral q1800   Continuous Infusions:   Principal Problem:   Septic  shock Active Problems:   Hyperlipidemia   Diabetes   CAP (community acquired pneumonia)   Leukocytosis   Sinus tachycardia   Respiratory distress   Hypotension    Time spent: 25 min    [facility] Pager [phone]. If 7PM-7AM, please contact night-coverage at [email or web address], password [facility] 12/10/2013, 11:36 AM  LOS: 2 days

## 2013-12-11 DIAGNOSIS — G2581 Restless legs syndrome: Secondary | ICD-10-CM

## 2013-12-11 LAB — CBC
HEMATOCRIT: 32.7 % — AB (ref 39.0–52.0)
Hemoglobin: 10.9 g/dL — ABNORMAL LOW (ref 13.0–17.0)
MCH: 32.8 pg (ref 26.0–34.0)
MCHC: 33.3 g/dL (ref 30.0–36.0)
MCV: 98.5 fL (ref 78.0–100.0)
Platelets: 258 10*3/uL (ref 150–400)
RBC: 3.32 MIL/uL — ABNORMAL LOW (ref 4.22–5.81)
RDW: 13 % (ref 11.5–15.5)
WBC: 10.4 10*3/uL (ref 4.0–10.5)

## 2013-12-11 MED ORDER — AZITHROMYCIN 500 MG PO TABS: 500.0000 mg | ORAL_TABLET | Freq: Every day | ORAL | Status: AC

## 2013-12-11 MED ORDER — METFORMIN HCL 500 MG PO TABS: 500.0000 mg | ORAL_TABLET | Freq: Two times a day (BID) | ORAL | Status: DC

## 2013-12-11 MED ORDER — DIPHENHYDRAMINE HCL 25 MG PO CAPS: 25.0000 mg | ORAL_CAPSULE | Freq: Once | ORAL | Status: DC

## 2013-12-11 NOTE — Evaluation (Signed)
Clinical/Bedside Swallow Evaluation Patient Details  Name: Travis Bradford MRN: [id number] Date of Birth: 02/14/1926  Today's Date: 12/11/2013 Time: 1130-1230 SLP Time Calculation (min): 60 min  Past Medical History:  Past Medical History  Diagnosis Date  . Hyperlipidemia   . BPH (benign prostatic hyperplasia)   . Lymphoma     15 years ago  . Arthritis   . History of blood transfusion   . Pneumonia   . Prostate cancer   . Hypothyroid    Past Surgical History:  Past Surgical History  Procedure Laterality Date  . Appendectomy    . Athroscopic knee surgery    . Mohs surgery      x 6  . Dental implants    . Green light laser turp (transurethral resection of prostate  08/29/2012    Procedure: GREEN LIGHT LASER TURP (TRANSURETHRAL RESECTION OF PROSTATE;  Surgeon: [name], MD;  Location: [facility];  Service: Urology;  Laterality: N/A;   HPI:  Travis Bradford is a 80 y.o. male with a recent diagnosis of Hypothyroidism, and history of Hyperlipidemia and borderline Type 2 DM, and  Prostate Cancer S/P Laser TURP with remote history of NHL 15 years ago now in remission who presents to the ED with complaints of night sweats and chills nightly x 1 week and fevers and increased nonproductive cough and wheezing x 1 days.  He was found to have a temperature in the ED of 102. At home his son had found his oxygen level to be in the mid 80's and he was taken to the ED.   In the ED he was placed on 4 liters [place] O2 and had improvement in his oxygenation to 95%.   He was evaluated and found to have a a multifocal pneumonia on Chest X-ray   Assessment / Plan / Recommendation Clinical Impression  Patient appears to have relatively normal swallow function for most consistencies, but reports frequent coughing when eating dry, crumbly consistencies, and reports having to take a drink to clear food from throat after initial swallow.  Laryngeal residue is suspected, but patient felt immediate improvement  with use of chin tuck head position with swallow (and no cough was observed with use of chin tuck).  Patient and son also described symptoms of GERD with question of aspiration of reflux at night.  SLP educated pt and son to reflux precautions and  recommended HOB be elevated with blocks at home.    Aspiration Risk  Mild    Diet Recommendation Regular;Thin liquid (Avoid dry particle foods)   Liquid Administration via: Cup;Straw Medication Administration: Whole meds with liquid Supervision: Patient able to self feed;Intermittent supervision to cue for compensatory strategies Compensations: Slow rate;Small sips/bites;Follow solids with liquid;Multiple dry swallows after each bite/sip Postural Changes and/or Swallow Maneuvers: Out of bed for meals;Seated upright 90 degrees;Upright 30-60 min after meal;Chin tuck    Other  Recommendations Recommended Consults: Other (Comment) (If issues persists, consider return for [facility]) Oral Care Recommendations: Oral care BID;Patient independent with oral care Other Recommendations: Clarify dietary restrictions   Follow Up Recommendations  None    Frequency and Duration        Pertinent Vitals/Pain No c/o of pain; afebrile      Swallow Study Prior Functional Status       General Date of Onset:  ("for awhile now.") HPI: Travis Bradford is a 80 y.o. male with a recent diagnosis of Hypothyroidism, and history of Hyperlipidemia and borderline Type 2 DM, and  Prostate  Cancer S/P Laser TURP with remote history of NHL 15 years ago now in remission who presents to the ED with complaints of night sweats and chills nightly x 1 week and fevers and increased nonproductive cough and wheezing x 1 days.  He was found to have a temperature in the ED of 102. At home his son had found his oxygen level to be in the mid 80's and he was taken to the ED.   In the ED he was placed on 4 liters [place] O2 and had improvement in his oxygenation to 95%.   He was evaluated and found to  have a a multifocal pneumonia on Chest X-ray Type of Study: Bedside swallow evaluation Previous Swallow Assessment: none Diet Prior to this Study: Regular;Thin liquids Temperature Spikes Noted: Yes Respiratory Status: Room air History of Recent Intubation: No Behavior/Cognition: Alert;Cooperative;Pleasant mood;Distractible;Hard of hearing Oral Cavity - Dentition: Adequate natural dentition Self-Feeding Abilities: Able to feed self Patient Positioning: Upright in chair Baseline Vocal Quality: Clear Volitional Cough: Strong Volitional Swallow: Able to elicit    Oral/Motor/Sensory Function Overall Oral Motor/Sensory Function: Appears within functional limits for tasks assessed   Ice Chips Ice chips: Not tested   Thin Liquid Thin Liquid: Within functional limits    Nectar Thick Nectar Thick Liquid: Not tested   Honey Thick Honey Thick Liquid: Not tested   Puree Puree: Within functional limits Presentation: Spoon;Self Fed   Solid   GO    Solid: Impaired Presentation: Self Fed (mixed nuts) Pharyngeal Phase Impairments: Multiple swallows;Cough - Delayed       Joaquim Nam 12/11/2013,12:52 PM

## 2013-12-11 NOTE — Discharge Instructions (Addendum)
Swallowing Instructions:  (Avoid dry particle foods)  Slow rate;Small sips/bites;Follow solids with liquid;Multiple dry swallows after each bite/sip  Postural Changes and/or Swallow Maneuvers: Out of bed for meals;Seated upright 90 degrees;Upright 30-60 min after meal;Chin tuck    Type 2 Diabetes Mellitus, Adult Type 2 diabetes mellitus is a long-term (chronic) disease. In type 2 diabetes:  The pancreas does not make enough of a hormone called insulin.  The cells in the body do not respond as well to the insulin that is made.  Both of the above can happen. Normally, insulin moves sugars from food into tissue cells. This gives you energy. If you have type 2 diabetes, sugars cannot be moved into tissue cells. This causes high blood sugar (hyperglycemia).  HOME CARE  Have your hemoglobin A1c level checked twice a year. The level shows if your diabetes is under control or out of control.  Perform daily blood sugar testing as told by your doctor.  Check your ketone levels by testing your pee (urine) when you are sick and as told.  Take your diabetes or insulin medicine as told by your doctor.  Never run out of insulin.  Adjust how much insulin you give yourself based on how many carbs (carbohydrates) you eat. Carbs are in many foods, such as fruits, vegetables, whole grains, and dairy products.  Have a healthy snack between every healthy meal. Have 3 meals and 3 snacks a day.  Lose weight if you are overweight.  Carry a medical alert card or wear your medical alert jewelry.  Carry a 15 gram carb snack with you at all times. Examples include:  Glucose pills, 3 or 4.  Glucose gel, 15 gram tube.  Raisins, 2 tablespoons (24 grams).  Jelly beans, 6.  Animal crackers, 8.  Sugar pop, 4 ounces (120 milliliters).  Gummy treats, 9.  Notice low blood sugar (hypoglycemia) symptoms, such as:  Shaking (tremors).  Decreased ability to think clearly.  Sweating.  Increased heart  rate.  Headache.  Dry mouth.  Hunger.  Crabbiness (irritability).  Being worried or tense (anxiety).  Restless sleep.  A change in speech or coordination.  Confusion.  Treat low blood sugar right away. If you are alert and can swallow, follow the 15:15 rule:  Take 15 20 grams of a rapid-acting glucose or carb. This includes glucose gel, glucose pills, or 4 ounces (120 milliliters) of fruit juice, regular pop, or low-fat milk.  Check your blood sugar level after taking the glucose.  Take 15 20 grams of more glucose if the repeat blood sugar level is still 70 mg/dL (milligrams/deciliter) or below.  Eat a meal or snack within 1 hour of the blood sugar levels going back to normal.  Notice early symptoms of high blood sugar, such as:  Being really thirsty or drinking a lot (polydipsia).  Peeing (urinating) a lot (polyuria).  Do at least 150 minutes of physical activity a week or as told.  Split the 150 minutes of activity up during the week. Do not do 150 minutes of activity in one day.  Perform exercises, such as weight lifting, at least 2 times a week or as told.  Adjust your insulin or food intake as needed if you start a new exercise or sport.  Follow your sick day plan when you are not able to eat or drink as usual.  Avoid tobacco use.  Women who are not pregnant should drink no more than 1 drink a day. Men should drink no more  than 2 drinks a day.  Only drink alcohol with food.  Ask your doctor if alcohol is safe for you.  Tell your doctor if you drink alcohol several times during the week.  See your doctor regularly.  Schedule an eye exam soon after you are diagnosed with diabetes. Schedule exams once every year.  Check your skin and feet every day. Check for cuts, bruises, redness, nail problems, bleeding, blisters, or sores. A doctor should do a foot exam once a year.  Brush your teeth and gums twice a day. Floss once a day. Visit your dentist  regularly.  Share your diabetes plan with your workplace or school.  Stay up-to-date with shots that fight against diseases (immunizations).  Learn how to manage stress.  Get diabetes education and support as needed.  Ask your doctor for special help if:  You need help to maintain or improve how you to do things on your own.  You need help to maintain or improve the quality of your life.  You have foot or hand problems.  You have trouble cleaning yourself, dressing, eating, or doing physical activity. GET HELP RIGHT AWAY IF:  You have trouble breathing.  You have moderate to large ketone levels.  You are unable to eat food or drink fluids for more than 6 hours.  You feel sick to your stomach (nauseous) or throw up (vomit) for more than 6 hours.  Your blood sugar level is over 240 mg/dL.  There is a change in mental status.  You get another serious illness.  You have watery poop (diarrhea) for more than 6 hours.  You have been sick or have had a fever for 2 or more days and are not getting better.  You have pain when you are physically active. MAKE SURE YOU:  Understand these instructions.  Will watch your condition.  Will get help right away if you are not doing well or get worse. Document Released: 04/21/2008 Document Revised: 05/03/2013 Document Reviewed: 11/11/2012 [facility] Patient Information 2014 [place], [place].

## 2013-12-11 NOTE — Progress Notes (Signed)
Patient given discharge instructions, and verbalized an understanding of all discharge instructions.  Patient agrees with discharge plan, and is being discharged in stable medical condition.  Patient given transportation via wheelchair.  Son to provide patient with transportation home.  Durwin Nora RN

## 2013-12-11 NOTE — Discharge Summary (Signed)
Physician Discharge Summary  Travis Bradford UJW:[id number] DOB: [birth year] DOA: 12/08/2013  PCP: No PCP Per Patient  Admit date: 12/08/2013 Discharge date: 12/11/2013  Time spent: 25 minutes  Recommendations for Outpatient Follow-up:  1. New medication: Metformin 500 twice a day  New diagnosis: Patient's A1c at 6.9 consistent with diabetes mellitus type2. The patient had been told that his diabetes was borderline 2. New medication: Zithromax 500 by mouth daily x4 days to complete a total 7 day course of antibiotics for pneumonia 3. Patient has been given referral information to establish with a primary care physician here in town  Discharge Diagnoses:  Principal Problem:   Septic shock Active Problems:   Bradycardia   Hyperlipidemia   Diabetes mellitus with renal manifestation   CAP (community acquired pneumonia)   Leukocytosis   Sinus tachycardia   Respiratory distress   Hypotension   Unspecified hypothyroidism   Restless leg syndrome   Nonspecific abnormal electrocardiogram (ECG) (EKG)   Discharge Condition: Improved, being discharged home  Diet recommendation: Carb modified  Filed Weights   12/09/13 0055 12/09/13 1110 12/09/13 1424  Weight: 97.4 kg (214 lb 11.7 oz) 97.8 kg (215 lb 9.8 oz) 97.8 kg (215 lb 9.8 oz)    History of present illness:  80 year old white male with past medical history of recent hypothyroidism and previous prostate cancer status post TURP presented to emergency room on 5/15 the evening with complaints of fevers chills times one week with nonproductive cough and wheezing. Patient had a temperature of 102 and he required oxygen in the emergency room. Chest x-ray noted for multilobar pneumonia. Patient admitted to the hospitalist service.  Upon admission, he was noted to be hypotensive and placed in the step down unit  Hospital Course:  Principal Problem:   Septic shock: Patient met criteria during initial hypotension, markedly leukocytosis, fever and  source believed to be pneumonia. He is treated aggressively with IV fluids and antibiotics which she responded well to and the following day, white blood cell count was slowly improving, oxygen requirements were less and he remained afebrile. He was transferred to the floor. See below.  Active Problems:   Bradycardia: Patient at times heart rate was noted to range from 48 to low 60s. Not on beta blocker normally. TSH checked and found to be normal. Patient asymptomatic. Recommendation is for outpatient followup    Hyperlipidemia: Stable for continued on Zocor.    Diabetes mellitus with renal manifestation: Sugars noted to be elevated. Patient previously had a diagnosis of glucose intolerance. As he does not have a dedicated in town primary care physician, A1c checked and found to be elevated at 6.9 consistent with a diagnosis of diabetes mellitus type 2. Started on metformin upon discharge.    CAP (community acquired pneumonia): After transfer to floor, patient did well. He was able to be weaned off of oxygen. His white blood cell count normalized by 5/18 and he'll be discharged on several more days of by mouth box for total of 7 days. Blood cultures remained negative.    Leukocytosis: Secondary pneumonia. Result.    Sinus tachycardia: Secondary to hypotension from septic shock. Result.    Respiratory distress: Secondary pneumonia on admission. Result with treatment.    Hypotension: Severe septic shock. Result.    Unspecified hypothyroidism: Patient with new diagnosis of hypothyroidism she started on Synthroid one week ago. Interestingly, TSH here normal. Continue Synthroid.    Restless leg syndrome: Stable. Continued on Requip.    Nonspecific abnormal electrocardiogram (ECG) (EKG):  EKG on admission noted left anterior fascicular block and right bundle branch block. This is markedly different from EKG done February 2014. Repeat EKG done at 5/17 was similar to the one done 15 months prior.  Likely one done in emergency room may be an error due to lead placement. Further workup needed for this  Procedures:  None  Consultations:  None  Discharge Exam: Filed Vitals:   12/11/13 0552  BP: 150/67  Pulse: 63  Temp: 97.6 F (36.4 C)  Resp: 18    General: Alert and oriented x3, no acute distress Cardiovascular: Regular rate and rhythm, S1-S2 Respiratory: Clear to auscultation bilaterally  Discharge Instructions You were cared for by a hospitalist during your hospital stay. If you have any questions about your discharge medications or the care you received while you were in the hospital after you are discharged, you can call the unit and asked to speak with the hospitalist on call if the hospitalist that took care of you is not available. Once you are discharged, your primary care physician will handle any further medical issues. Please note that NO REFILLS for any discharge medications will be authorized once you are discharged, as it is imperative that you return to your primary care physician (or establish a relationship with a primary care physician if you do not have one) for your aftercare needs so that they can reassess your need for medications and monitor your lab values.     Medication List         aspirin 81 MG tablet  Take 81 mg by mouth daily.     azithromycin 500 MG tablet  Commonly known as:  ZITHROMAX  Take 1 tablet (500 mg total) by mouth daily.  Start taking on:  12/12/2013     fish oil-omega-3 fatty acids 1000 MG capsule  Take 2 g by mouth daily.     glucosamine-chondroitin 500-400 MG tablet  Take 1 tablet by mouth daily.     levothyroxine 100 MCG tablet  Commonly known as:  SYNTHROID, LEVOTHROID  Take 100 mcg by mouth daily.     metFORMIN 500 MG tablet  Commonly known as:  GLUCOPHAGE  Take 1 tablet (500 mg total) by mouth 2 (two) times daily with a meal.     MSM 500 MG Caps  Take 1 capsule by mouth daily.     multivitamin with minerals  Tabs tablet  Take 1 tablet by mouth daily.     rOPINIRole 3 MG tablet  Commonly known as:  REQUIP  Take 3 mg by mouth at bedtime.     simvastatin 40 MG tablet  Commonly known as:  ZOCOR  Take 40 mg by mouth daily before breakfast.       No Known Allergies    The results of significant diagnostics from this hospitalization (including imaging, microbiology, ancillary and laboratory) are listed below for reference.    Significant Diagnostic Studies: Dg Chest 2 View  12/08/2013    IMPRESSION: Increased pulmonary interstitial markings bilaterally likely reflects interstitial pneumonia. There is no significant pulmonary vascular congestion nor evidence of true enlargement of the cardiac silhouette.   Electronically Signed   By: David  Martinique   On: 12/08/2013 22:49   Dg Chest Portable 1 View  12/08/2013     IMPRESSION: Increased interstitial markings bilaterally may reflect interstitial edema of cardiac or noncardiac calls. Interstitial pneumonia is in the differential as well. There is no alveolar pneumonia demonstrated. When the patient can tolerate the procedure,  a PA and lateral chest x-ray would be of value.   Electronically Signed   By: David  Martinique   On: 12/08/2013 21:34    Microbiology: Recent Results (from the past 240 hour(s))  MRSA PCR SCREENING     Status: None   Collection Time    12/09/13  1:10 AM      Result Value Ref Range Status   MRSA by PCR NEGATIVE  NEGATIVE Final   Comment:            The GeneXpert MRSA Assay (FDA     approved for NASAL specimens     only), is one component of a     comprehensive MRSA colonization     surveillance program. It is not     intended to diagnose MRSA     infection nor to guide or     monitor treatment for     MRSA infections.     Labs: Basic Metabolic Panel:  Recent Labs Lab 12/08/13 2021 12/09/13 0931 12/10/13 0525  NA 137 136* 137  K 4.4 4.1 4.4  CL 99 100 102  CO2 _0 GLUCOSE 147* 173* 123*  BUN 23 24* 22   CREATININE 1.23 1.19 0.99  CALCIUM 9.5 8.6 9.0   Liver Function Tests:  Recent Labs Lab 12/08/13 2021  AST 19  ALT 16  ALKPHOS 60  BILITOT 0.3  PROT 7.2  ALBUMIN 3.4*   No results found for this basename: LIPASE, AMYLASE,  in the last 168 hours No results found for this basename: AMMONIA,  in the last 168 hours CBC:  Recent Labs Lab 12/08/13 2021 12/09/13 0931 12/09/13 1500 12/10/13 0525 12/11/13 0505  WBC 18.7* 26.2* 23.0* 14.0* 10.4  NEUTROABS 16.9*  --   --   --   --   HGB 13.1 11.0* 10.5* 10.6* 10.9*  HCT 39.8 32.6* 32.0* 32.3* 32.7*  MCV 98.3 97.9 99.1 98.2 98.5  PLT 305 280 267 260 258   Cardiac Enzymes:  Recent Labs Lab 12/08/13 2021  TROPONINI <0.30   BNP: BNP (last 3 results)  Recent Labs  12/09/13 0125  PROBNP 374.6   CBG: No results found for this basename: GLUCAP,  in the last 168 hours     Signed:  Annita Brod  [facility] 12/11/2013, 10:11 AM

## 2013-12-20 ENCOUNTER — Encounter (HOSPITAL_COMMUNITY): Payer: Self-pay | Admitting: Emergency Medicine

## 2013-12-20 ENCOUNTER — Inpatient Hospital Stay (HOSPITAL_COMMUNITY)
Admission: EM | Admit: 2013-12-20 | Discharge: 2013-12-22 | DRG: 871 | Disposition: A | Discharge status: Home / Self Care | Payer: Medicare Other | Attending: Family Medicine | Admitting: Family Medicine

## 2013-12-20 ENCOUNTER — Emergency Department (HOSPITAL_COMMUNITY): Payer: Medicare Other

## 2013-12-20 DIAGNOSIS — N400 Enlarged prostate without lower urinary tract symptoms: Secondary | ICD-10-CM | POA: Diagnosis present

## 2013-12-20 DIAGNOSIS — D649 Anemia, unspecified: Secondary | ICD-10-CM | POA: Diagnosis not present

## 2013-12-20 DIAGNOSIS — R0603 Acute respiratory distress: Secondary | ICD-10-CM

## 2013-12-20 DIAGNOSIS — M129 Arthropathy, unspecified: Secondary | ICD-10-CM | POA: Diagnosis not present

## 2013-12-20 DIAGNOSIS — R9431 Abnormal electrocardiogram [ECG] [EKG]: Secondary | ICD-10-CM

## 2013-12-20 DIAGNOSIS — R0609 Other forms of dyspnea: Secondary | ICD-10-CM | POA: Diagnosis not present

## 2013-12-20 DIAGNOSIS — E785 Hyperlipidemia, unspecified: Secondary | ICD-10-CM

## 2013-12-20 DIAGNOSIS — Z8546 Personal history of malignant neoplasm of prostate: Secondary | ICD-10-CM | POA: Diagnosis not present

## 2013-12-20 DIAGNOSIS — Z87891 Personal history of nicotine dependence: Secondary | ICD-10-CM | POA: Diagnosis not present

## 2013-12-20 DIAGNOSIS — R0989 Other specified symptoms and signs involving the circulatory and respiratory systems: Secondary | ICD-10-CM | POA: Diagnosis not present

## 2013-12-20 DIAGNOSIS — E039 Hypothyroidism, unspecified: Secondary | ICD-10-CM | POA: Diagnosis present

## 2013-12-20 DIAGNOSIS — N058 Unspecified nephritic syndrome with other morphologic changes: Secondary | ICD-10-CM | POA: Diagnosis present

## 2013-12-20 DIAGNOSIS — R9430 Abnormal result of cardiovascular function study, unspecified: Secondary | ICD-10-CM

## 2013-12-20 DIAGNOSIS — R6520 Severe sepsis without septic shock: Secondary | ICD-10-CM

## 2013-12-20 DIAGNOSIS — Z79899 Other long term (current) drug therapy: Secondary | ICD-10-CM

## 2013-12-20 DIAGNOSIS — Z8249 Family history of ischemic heart disease and other diseases of the circulatory system: Secondary | ICD-10-CM | POA: Diagnosis not present

## 2013-12-20 DIAGNOSIS — J189 Pneumonia, unspecified organism: Secondary | ICD-10-CM | POA: Diagnosis not present

## 2013-12-20 DIAGNOSIS — R000 Tachycardia, unspecified: Secondary | ICD-10-CM

## 2013-12-20 DIAGNOSIS — R5383 Other fatigue: Secondary | ICD-10-CM | POA: Diagnosis not present

## 2013-12-20 DIAGNOSIS — R5381 Other malaise: Secondary | ICD-10-CM | POA: Diagnosis not present

## 2013-12-20 DIAGNOSIS — I498 Other specified cardiac arrhythmias: Secondary | ICD-10-CM | POA: Diagnosis not present

## 2013-12-20 DIAGNOSIS — A419 Sepsis, unspecified organism: Principal | ICD-10-CM | POA: Diagnosis present

## 2013-12-20 DIAGNOSIS — N390 Urinary tract infection, site not specified: Secondary | ICD-10-CM | POA: Diagnosis present

## 2013-12-20 DIAGNOSIS — E860 Dehydration: Secondary | ICD-10-CM | POA: Diagnosis present

## 2013-12-20 DIAGNOSIS — R001 Bradycardia, unspecified: Secondary | ICD-10-CM

## 2013-12-20 DIAGNOSIS — E1129 Type 2 diabetes mellitus with other diabetic kidney complication: Secondary | ICD-10-CM | POA: Diagnosis present

## 2013-12-20 DIAGNOSIS — J9819 Other pulmonary collapse: Secondary | ICD-10-CM | POA: Diagnosis not present

## 2013-12-20 DIAGNOSIS — G2581 Restless legs syndrome: Secondary | ICD-10-CM

## 2013-12-20 DIAGNOSIS — R6521 Severe sepsis with septic shock: Secondary | ICD-10-CM

## 2013-12-20 DIAGNOSIS — Z7982 Long term (current) use of aspirin: Secondary | ICD-10-CM

## 2013-12-20 DIAGNOSIS — J438 Other emphysema: Secondary | ICD-10-CM | POA: Diagnosis not present

## 2013-12-20 DIAGNOSIS — M1990 Unspecified osteoarthritis, unspecified site: Secondary | ICD-10-CM

## 2013-12-20 DIAGNOSIS — I959 Hypotension, unspecified: Secondary | ICD-10-CM

## 2013-12-20 DIAGNOSIS — D72829 Elevated white blood cell count, unspecified: Secondary | ICD-10-CM | POA: Diagnosis present

## 2013-12-20 LAB — PRO B NATRIURETIC PEPTIDE: Pro B Natriuretic peptide (BNP): 216.4 pg/mL (ref 0–450)

## 2013-12-20 LAB — CBC WITH DIFFERENTIAL/PLATELET
Basophils Absolute: 0 10*3/uL (ref 0.0–0.1)
Basophils Relative: 0 % (ref 0–1)
Eosinophils Absolute: 0 10*3/uL (ref 0.0–0.7)
Eosinophils Relative: 0 % (ref 0–5)
HCT: 39.9 % (ref 39.0–52.0)
Hemoglobin: 13.3 g/dL (ref 13.0–17.0)
Lymphocytes Relative: 2 % — ABNORMAL LOW (ref 12–46)
Lymphs Abs: 0.7 10*3/uL (ref 0.7–4.0)
MCH: 32.4 pg (ref 26.0–34.0)
MCHC: 33.3 g/dL (ref 30.0–36.0)
MCV: 97.3 fL (ref 78.0–100.0)
Monocytes Absolute: 0.7 10*3/uL (ref 0.1–1.0)
Monocytes Relative: 2 % — ABNORMAL LOW (ref 3–12)
Neutro Abs: 33.5 10*3/uL — ABNORMAL HIGH (ref 1.7–7.7)
Neutrophils Relative %: 96 % — ABNORMAL HIGH (ref 43–77)
Platelets: 349 10*3/uL (ref 150–400)
RBC: 4.1 MIL/uL — ABNORMAL LOW (ref 4.22–5.81)
RDW: 12.9 % (ref 11.5–15.5)
WBC: 34.9 10*3/uL — ABNORMAL HIGH (ref 4.0–10.5)

## 2013-12-20 LAB — URINALYSIS, ROUTINE W REFLEX MICROSCOPIC
BILIRUBIN URINE: NEGATIVE
GLUCOSE, UA: NEGATIVE mg/dL
Glucose, UA: NEGATIVE mg/dL
Ketones, ur: 15 mg/dL — AB
Ketones, ur: NEGATIVE mg/dL
Leukocytes, UA: NEGATIVE
Nitrite: NEGATIVE
Nitrite: NEGATIVE
PROTEIN: NEGATIVE mg/dL
Protein, ur: 30 mg/dL — AB
Specific Gravity, Urine: 1.027 (ref 1.005–1.030)
Specific Gravity, Urine: >1.046 — ABNORMAL HIGH (ref 1.005–1.030)
Urobilinogen, UA: 0.2 mg/dL (ref 0.0–1.0)
Urobilinogen, UA: 0.2 mg/dL (ref 0.0–1.0)
pH: 5.5 (ref 5.0–8.0)
pH: 5.5 (ref 5.0–8.0)

## 2013-12-20 LAB — BASIC METABOLIC PANEL
BUN: 25 mg/dL — ABNORMAL HIGH (ref 6–23)
CO2: 24 mEq/L (ref 19–32)
Calcium: 10.2 mg/dL (ref 8.4–10.5)
Chloride: 97 mEq/L (ref 96–112)
Creatinine, Ser: 0.93 mg/dL (ref 0.50–1.35)
GFR calc Af Amer: 86 mL/min — ABNORMAL LOW (ref >90)
GFR calc non Af Amer: 74 mL/min — ABNORMAL LOW (ref >90)
Glucose, Bld: 165 mg/dL — ABNORMAL HIGH (ref 70–99)
Potassium: 4.3 mEq/L (ref 3.7–5.3)
Sodium: 136 mEq/L — ABNORMAL LOW (ref 137–147)

## 2013-12-20 LAB — STREP PNEUMONIAE URINARY ANTIGEN: Strep Pneumo Urinary Antigen: NEGATIVE

## 2013-12-20 LAB — LACTIC ACID, PLASMA: Lactic Acid, Venous: 1.5 mmol/L (ref 0.5–2.2)

## 2013-12-20 LAB — URINE MICROSCOPIC-ADD ON

## 2013-12-20 LAB — TROPONIN I: Troponin I: <0.3 ng/mL (ref <0.30)

## 2013-12-20 MED ORDER — ASPIRIN 81 MG PO TABS: 81.0000 mg | ORAL_TABLET | Freq: Every day | ORAL | Status: DC

## 2013-12-20 MED ORDER — IOHEXOL 350 MG/ML SOLN
100.0000 mL | Freq: Once | INTRAVENOUS | Status: AC | PRN
  Administered 2013-12-20: 100 mL via INTRAVENOUS

## 2013-12-20 MED ORDER — MENTHOL 3 MG MT LOZG
1.0000 | LOZENGE | OROMUCOSAL | Status: DC | PRN
  Filled 2013-12-20: qty 9

## 2013-12-20 MED ORDER — DEXTROSE 5 % IV SOLN
1.0000 g | Freq: Once | INTRAVENOUS | Status: AC
  Administered 2013-12-20: 1 g via INTRAVENOUS
  Filled 2013-12-20: qty 10

## 2013-12-20 MED ORDER — DIPHENHYDRAMINE HCL 12.5 MG/5ML PO ELIX
12.5000 mg | ORAL_SOLUTION | Freq: Every evening | ORAL | Status: DC | PRN
  Administered 2013-12-20 – 2013-12-21 (×2): 12.5 mg via ORAL
  Filled 2013-12-20 (×2): qty 5

## 2013-12-20 MED ORDER — OMEGA-3 FATTY ACIDS 1000 MG PO CAPS: 2.0000 g | ORAL_CAPSULE | Freq: Every day | ORAL | Status: DC

## 2013-12-20 MED ORDER — LEVOTHYROXINE SODIUM 100 MCG PO TABS
100.0000 ug | ORAL_TABLET | Freq: Every day | ORAL | Status: DC
  Administered 2013-12-21 – 2013-12-22 (×2): 100 ug via ORAL
  Filled 2013-12-20 (×3): qty 1

## 2013-12-20 MED ORDER — BENZONATATE 100 MG PO CAPS
100.0000 mg | ORAL_CAPSULE | Freq: Three times a day (TID) | ORAL | Status: DC
  Administered 2013-12-20 – 2013-12-22 (×6): 100 mg via ORAL
  Filled 2013-12-20 (×8): qty 1

## 2013-12-20 MED ORDER — VANCOMYCIN HCL IN DEXTROSE 1-5 GM/200ML-% IV SOLN
1000.0000 mg | Freq: Once | INTRAVENOUS | Status: AC
  Administered 2013-12-20: 1000 mg via INTRAVENOUS
  Filled 2013-12-20: qty 200

## 2013-12-20 MED ORDER — GLUCOSAMINE-CHONDROITIN 500-400 MG PO TABS: 1.0000 | ORAL_TABLET | Freq: Every day | ORAL | Status: DC

## 2013-12-20 MED ORDER — CEFEPIME HCL 1 G IJ SOLR: 1.0000 g | Freq: Three times a day (TID) | INTRAMUSCULAR | Status: DC

## 2013-12-20 MED ORDER — ACETAMINOPHEN 325 MG PO TABS
650.0000 mg | ORAL_TABLET | Freq: Four times a day (QID) | ORAL | Status: DC | PRN
  Administered 2013-12-20 – 2013-12-21 (×2): 650 mg via ORAL
  Filled 2013-12-20 (×2): qty 2

## 2013-12-20 MED ORDER — DEXTROSE 5 % IV SOLN
1.0000 g | Freq: Two times a day (BID) | INTRAVENOUS | Status: DC
  Administered 2013-12-20: 1 g via INTRAVENOUS
  Filled 2013-12-20: qty 1

## 2013-12-20 MED ORDER — VANCOMYCIN HCL IN DEXTROSE 750-5 MG/150ML-% IV SOLN
750.0000 mg | Freq: Two times a day (BID) | INTRAVENOUS | Status: DC
  Administered 2013-12-21 (×2): 750 mg via INTRAVENOUS
  Filled 2013-12-20 (×2): qty 150

## 2013-12-20 MED ORDER — MSM 500 MG PO CAPS: 1.0000 | ORAL_CAPSULE | Freq: Every day | ORAL | Status: DC

## 2013-12-20 MED ORDER — GUAIFENESIN-DM 100-10 MG/5ML PO SYRP: 5.0000 mL | ORAL_SOLUTION | ORAL | Status: DC | PRN

## 2013-12-20 MED ORDER — ASPIRIN EC 81 MG PO TBEC
81.0000 mg | DELAYED_RELEASE_TABLET | Freq: Every day | ORAL | Status: DC
  Administered 2013-12-21 – 2013-12-22 (×2): 81 mg via ORAL
  Filled 2013-12-20 (×2): qty 1

## 2013-12-20 MED ORDER — DEXTROSE 5 % IV SOLN
1.0000 g | Freq: Three times a day (TID) | INTRAVENOUS | Status: DC
  Administered 2013-12-20 – 2013-12-21 (×2): 1 g via INTRAVENOUS
  Filled 2013-12-20 (×5): qty 1

## 2013-12-20 MED ORDER — OMEGA-3-ACID ETHYL ESTERS 1 G PO CAPS
2.0000 g | ORAL_CAPSULE | Freq: Every day | ORAL | Status: DC
  Administered 2013-12-21 – 2013-12-22 (×2): 2 g via ORAL
  Filled 2013-12-20 (×2): qty 2

## 2013-12-20 MED ORDER — ROPINIROLE HCL 1 MG PO TABS
3.0000 mg | ORAL_TABLET | Freq: Every day | ORAL | Status: DC
  Administered 2013-12-20 – 2013-12-21 (×2): 3 mg via ORAL
  Filled 2013-12-20 (×3): qty 3

## 2013-12-20 MED ORDER — ADULT MULTIVITAMIN W/MINERALS CH
1.0000 | ORAL_TABLET | Freq: Every day | ORAL | Status: DC
  Administered 2013-12-21 – 2013-12-22 (×2): 1 via ORAL
  Filled 2013-12-20 (×2): qty 1

## 2013-12-20 MED ORDER — SODIUM CHLORIDE 0.9 % IV SOLN
INTRAVENOUS | Status: DC
  Administered 2013-12-21 (×2): via INTRAVENOUS

## 2013-12-20 MED ORDER — ENOXAPARIN SODIUM 40 MG/0.4ML ~~LOC~~ SOLN
40.0000 mg | SUBCUTANEOUS | Status: DC
Start: 2013-12-20 — End: 2013-12-22
  Administered 2013-12-20 – 2013-12-21 (×2): 40 mg via SUBCUTANEOUS
  Filled 2013-12-20 (×3): qty 0.4

## 2013-12-20 MED ORDER — SIMVASTATIN 40 MG PO TABS
40.0000 mg | ORAL_TABLET | Freq: Every evening | ORAL | Status: DC
  Administered 2013-12-21: 40 mg via ORAL
  Filled 2013-12-20 (×2): qty 1

## 2013-12-20 NOTE — ED Notes (Signed)
MD at bedside. 

## 2013-12-20 NOTE — Progress Notes (Addendum)
ANTIBIOTIC CONSULT NOTE - INITIAL  Pharmacy Consult for Vancomycin & Cefepime  Indication: rule out HCAP  No Known Allergies  Patient Measurements:   Total body weight: 98 kg  Vital Signs: Temp: 98.5 F (36.9 C) (05/27 1010) Temp src: Oral (05/27 1010) BP: 124/54 mmHg (05/27 1357) Pulse Rate: 85 (05/27 1357) Intake/Output from previous day:   Intake/Output from this shift:   Labs:  Recent Labs  12/20/13 1050  WBC 34.9*  HGB 13.3  PLT 349  CREATININE 0.93   The CrCl is unknown because both a height and weight (above a minimum accepted value) are required for this calculation. No results found for this basename: VANCOTROUGH, Corlis Leak, VANCORANDOM, [place], GENTPEAK, GENTRANDOM, TOBRATROUGH, TOBRAPEAK, TOBRARND, AMIKACINPEAK, AMIKACINTROU, AMIKACIN,  in the last 72 hours   Microbiology: Recent Results (from the past 720 hour(s))  MRSA PCR SCREENING     Status: None   Collection Time    12/09/13  1:10 AM      Result Value Ref Range Status   MRSA by PCR NEGATIVE  NEGATIVE Final   Comment:            The GeneXpert MRSA Assay (FDA     approved for NASAL specimens     only), is one component of a     comprehensive MRSA colonization     surveillance program. It is not     intended to diagnose MRSA     infection nor to guide or     monitor treatment for     MRSA infections.   Medical History: Past Medical History  Diagnosis Date  . Hyperlipidemia   . BPH (benign prostatic hyperplasia)   . Lymphoma     15 years ago  . Arthritis   . History of blood transfusion   . Pneumonia   . Prostate cancer   . Hypothyroid    Medications:  Anti-infectives   Start     Dose/Rate Route Frequency Ordered Stop   12/20/13 1500  ceFEPIme (MAXIPIME) 1 g in dextrose 5 % 50 mL IVPB     1 g 100 mL/hr over 30 Minutes Intravenous Every 12 hours 12/20/13 1403     12/20/13 1400  vancomycin (VANCOCIN) IVPB 1000 mg/200 mL premix     1,000 mg 200 mL/hr over 60 Minutes Intravenous   Once 12/20/13 1355     12/20/13 1230  cefTRIAXone (ROCEPHIN) 1 g in dextrose 5 % 50 mL IVPB     1 g 100 mL/hr over 30 Minutes Intravenous  Once 12/20/13 1224 12/20/13 1357     Assessment: 52 yoM with probable HCAP: recent admit 5/15 for CAP treated with Rocephin/Zithromax. Developed cough overnight, febrile, decreased O2 sats.  Vancomycin x1 dose ordered by ED physician, then pharmacy to dose  Cefepime per pharmacy.  Goal of Therapy:  Vancomycin trough 15-20 mcg/ml Antibiotic dosing appropriate for disease state, renal function   Plan:   Cefepime 1gm q8h  Vancomycin 750mg  q12, begin 12 hr after 1st dose in ED  Minda Ditto PharmD Pager [phone] 12/20/2013, 2:10 PM

## 2013-12-20 NOTE — Progress Notes (Signed)
PHARMACIST - PHYSICIAN ORDER COMMUNICATION  CONCERNING: P&T Medication Policy on Herbal Medications  DESCRIPTION:  This patient's orders for:  MSM caps (methylsulfonylmethane), glucosamine-chondroitin  hve been noted.  These product(s) are classified as an "herbal" or natural product. Due to a lack of definitive safety studies or FDA approval, nonstandard manufacturing practices, plus the potential risk of unknown drug-drug interactions while on inpatient medications, the Pharmacy and Therapeutics Committee does not permit the use of "herbal" or natural products of this type within [facility].   ACTION TAKEN: The pharmacy department is unable to verify these orders at this time. Please reevaluate patient's clinical condition at discharge and address if the herbal or natural product(s) should be resumed at that time.  Johny Drilling, PharmD, BCPS Clinical Pharmacist Pager: [phone] Pharmacy: [phone] 12/20/2013 3:40 PM

## 2013-12-20 NOTE — Care Management Note (Addendum)
    Page 1 of 1   12/21/2013     4:25:38 PM CARE MANAGEMENT NOTE 12/21/2013  Patient:  Travis Bradford, Travis Bradford   Account Number:  [id number]  Date Initiated:  12/20/2013  Documentation initiated by:  Dessa Phi  Subjective/Objective Assessment:   80 Y/O M ADMITTED W/PNA.UTI,DEHYDRATION.READMIT-5/15-5/18-PNA.HX:NHL,PROSTATE CA,TURP.     Action/Plan:   FROM HOME ALONE.HAS PCP,PHARMACY.   Anticipated DC Date:  12/25/2013   Anticipated DC Plan:  [place]  CM consult  Patient refused services      Choice offered to / List presented to:             Status of service:  In process, will continue to follow Medicare Important Message given?   (If response is "NO", the following Medicare IM given date fields will be blank) Date Medicare IM given:   Date Additional Medicare IM given:    Discharge Disposition:    Per UR Regulation:  Reviewed for med. necessity/level of care/duration of stay  If discussed at [place] of Stay Meetings, dates discussed:    Comments:  12/21/13 Abriana Saltos RN,BSN NCM [phone] PT-HH.SPOKE TO PATIENT/SON IN RM ABOUT D/C PLANS.INFORMED OF RECOMMENDATION OF HHPT.SON DECLINES HHC,SAYS HE HAS PRIVATE DUTY ASSISTANCE, & ALL THE EQUIPMENT NEEDED.NO FURTHER D/C NEEDS.  12/20/13 Valley Ke RN,BSN NCM [phone] WOULD RECOMMEND PT/OT CONS.

## 2013-12-20 NOTE — ED Notes (Signed)
His son is with him, and he tells me pt. Was treated for pneumonia (hospitalized) some 10 days ago.  Here today with recurrence of cough/low spo2.  Pt. Is in no distress.

## 2013-12-20 NOTE — H&P (Signed)
History and Physical       Hospital Admission Note Date: 12/20/2013  Patient name: Travis Bradford Medical record number: [id number] Date of birth: 04-04-1926 Age: 80 y.o. Gender: male  PCP: No PCP Per Patient    Chief Complaint:  Coughing with fevers and chills since last night  HPI: Patient is 80 year old male with history of hypothyroidism, hyperlipidemia, diabetes,Prostate Cancer S/P Laser TURP with remote history of NHL 15 years ago now in remission who was recently discharged from the hospital on 12/11/13 for community-acquired pneumonia. History was obtained from the patient who reported he was doing fairly well after the discharge until yesterday when he went for swimming in [facility] center. He came home and in the middle of the night while sleeping he started having intractable coughing. He stated that it finally settled down and he went back to sleep, woke up this morning at 9:00 am and was having fevers and chills with productive coughing, significant generalized weakness. His temp was 100.3 and his son checked his O2 sats with a portable pulse ox and showed 89-90% on room air, tachycardia.  Review of Systems:  Constitutional: + fever, chills, diaphoresis, poor appetite and fatigue.  HEENT: Denies photophobia, eye pain, redness, hearing loss, ear pain, sore throat, rhinorrhea, sneezing, mouth sores, trouble swallowing, neck pain, neck stiffness and tinnitus.  + congestion Respiratory: Please see history of present illness Cardiovascular: Denies chest pain, palpitations and leg swelling.  Gastrointestinal: Denies nausea, vomiting, abdominal pain, diarrhea, constipation, blood in stool and abdominal distention.  Genitourinary: Denies dysuria, urgency, frequency, hematuria, flank pain and difficulty urinating.  Musculoskeletal: Denies myalgias, back pain, joint swelling, arthralgias and gait problem.  Skin: Denies pallor,  rash and wound.  Neurological: Denies dizziness, seizures, syncope, weakness, light-headedness, numbness and headaches.  Hematological: Denies adenopathy. Easy bruising, personal or family bleeding history  Psychiatric/Behavioral: Denies suicidal ideation, mood changes, confusion, nervousness, sleep disturbance and agitation  Past Medical History: Past Medical History  Diagnosis Date  . Hyperlipidemia   . BPH (benign prostatic hyperplasia)   . Lymphoma     15 years ago  . Arthritis   . History of blood transfusion   . Pneumonia   . Prostate cancer   . Hypothyroid    Past Surgical History  Procedure Laterality Date  . Appendectomy    . Athroscopic knee surgery    . Mohs surgery      x 6  . Dental implants    . Green light laser turp (transurethral resection of prostate  08/29/2012    Procedure: GREEN LIGHT LASER TURP (TRANSURETHRAL RESECTION OF PROSTATE;  Surgeon: [name], MD;  Location: [facility];  Service: Urology;  Laterality: N/A;    Medications: Prior to Admission medications   Medication Sig Start Date End Date Taking? Authorizing Provider  aspirin 81 MG tablet Take 81 mg by mouth daily.   Yes Historical Provider, MD  fish oil-omega-3 fatty acids 1000 MG capsule Take 2 g by mouth daily.   Yes Historical Provider, MD  glucosamine-chondroitin 500-400 MG tablet Take 1 tablet by mouth daily.   Yes Historical Provider, MD  levothyroxine (SYNTHROID, LEVOTHROID) 100 MCG tablet Take 100 mcg by mouth daily.   Yes Historical Provider, MD  metFORMIN (GLUCOPHAGE) 500 MG tablet Take 1 tablet (500 mg total) by mouth 2 (two) times daily with a meal. 12/11/13  Yes Annita Brod, MD  Methylsulfonylmethane (MSM) 500 MG CAPS Take 1 capsule by mouth daily.   Yes Historical Provider, MD  Multiple Vitamin (MULTIVITAMIN WITH MINERALS) TABS tablet Take 1 tablet by mouth daily.   Yes Historical Provider, MD  rOPINIRole (REQUIP) 3 MG tablet Take 3 mg by mouth at bedtime.   Yes Historical  Provider, MD  simvastatin (ZOCOR) 40 MG tablet Take 40 mg by mouth daily before breakfast.    Yes Historical Provider, MD    Allergies:  No Known Allergies  Social History:  reports that he has quit smoking. He has never used smokeless tobacco. He reports that he drinks alcohol. His drug history is not on file.  Family History: Family History  Problem Relation Age of Onset  . CAD Mother   . Stroke Brother      X 2    Physical Exam: Blood pressure 124/54, pulse 85, temperature 98.5 F (36.9 C), temperature source Oral, resp. rate 27, SpO2 96.00%. General: Alert, awake, oriented x3, in no acute distress. HEENT: normocephalic, atraumatic, anicteric sclera, pink conjunctiva, pupils equal and reactive to light and accomodation, oropharynx clear Neck: supple, no masses or lymphadenopathy, no goiter, no bruits  Heart: Decreased breath sounds at the bases, otherwise no rhonchi or wheezing Lungs: Clear to auscultation bilaterally, no wheezing, rales or rhonchi. Abdomen: Soft, nontender, nondistended, positive bowel sounds, no masses. Extremities: No clubbing, cyanosis or edema with positive pedal pulses. Neuro: Grossly intact, no focal neurological deficits, strength 5/5 upper and lower extremities bilaterally Psych: alert and oriented x 3, normal mood and affect Skin: no rashes or lesions, warm and dry   LABS on Admission:  Basic Metabolic Panel:  Recent Labs Lab 12/20/13 1050  NA 136*  K 4.3  CL 97  CO2 24  GLUCOSE 165*  BUN 25*  CREATININE 0.93  CALCIUM 10.2   Liver Function Tests: No results found for this basename: AST, ALT, ALKPHOS, BILITOT, PROT, ALBUMIN,  in the last 168 hours No results found for this basename: LIPASE, AMYLASE,  in the last 168 hours No results found for this basename: AMMONIA,  in the last 168 hours CBC:  Recent Labs Lab 12/20/13 1050  WBC 34.9*  NEUTROABS 33.5*  HGB 13.3  HCT 39.9  MCV 97.3  PLT 349   Cardiac Enzymes:  Recent  Labs Lab 12/20/13 1050  TROPONINI <0.30   BNP: No components found with this basename: POCBNP,  CBG: No results found for this basename: GLUCAP,  in the last 168 hours   Radiological Exams on Admission: Dg Chest 2 View  12/20/2013   CLINICAL DATA:  Dyspnea  EXAM: CHEST  2 VIEW  COMPARISON:  Dec 08, 2013  FINDINGS: There is persistent interstitial pneumonitis bilaterally. There appears to be some rather minimal clearing in the right upper lobe compared to the prior study. No other changes seen in the lung parenchyma. There is no new opacity. Heart size and pulmonary vascularity are normal. No adenopathy. There is atherosclerotic change in the aorta. There is degenerative change in the thoracic spine.  IMPRESSION: Slight interval clearing of opacity from the right upper lobe compared to prior study. Otherwise no change. There is patchy interstitial pneumonitis bilaterally. No new opacity.   Electronically Signed   By: Lowella Grip M.D.   On: 12/20/2013 10:56    Ct Angio Chest W/cm &/or Wo Cm  12/20/2013   CLINICAL DATA:  With local  EXAM: CT ANGIOGRAPHY CHEST WITH CONTRAST  TECHNIQUE: Multidetector CT imaging of the chest was performed using the standard protocol during bolus administration of intravenous contrast. Multiplanar CT image reconstructions and MIPs were  obtained to evaluate the vascular anatomy.  CONTRAST:  165mL OMNIPAQUE IOHEXOL 350 MG/ML SOLN  COMPARISON:  Chest radiograph Dec 20, 2013  FINDINGS: There is no demonstrable pulmonary embolus. There is no thoracic aortic aneurysm or dissection. There is atherosclerotic change in the aorta, however.  There are patchy areas of ground-glass type opacity throughout the lungs, most notably in the right upper lobe region. There is no well-defined segmental consolidation. There is underlying emphysematous change with the areas of patchy atelectasis bilaterally.  There is no appreciable thoracic adenopathy. There are scattered small lymph  nodes throughout the mediastinum which do not meet size criteria for pathologic significance.  Pericardium is not thickened. There are multiple foci of coronary artery calcification.  In the visualized upper abdomen, there are multiple gallstones within the gallbladder. The gallbladder wall is not appreciably thickened. There is evidence of fatty change in the liver. There are no blastic or lytic bone lesions. There is degenerative change in the thoracic spine. Thyroid appears within normal limits.  Review of the MIP images confirms the above findings.  IMPRESSION: No demonstrable pulmonary embolus. Patchy areas of ground-glass type opacity bilaterally, most notably in the right upper lobe. This distribution and appearance is suggestive of bronchopneumonia. There is no segmental consolidation, however. Given this appearance, a followup study in approximately 4 weeks to assess for clearing advised. No appreciable adenopathy.  There is cholelithiasis. There is fatty liver. There is extensive coronary artery calcification.   Electronically Signed   By: Lowella Grip M.D.   On: 12/20/2013 13:42      Assessment/Plan Principal Problem: SIRS with HCAP (healthcare-associated pneumonia): fever, leukocytosis, WBC 34.9 ( was 10.4 on discharge on 12/11/13), tachycardia -Patient will be admitted for HCAP CT angiogram chest showed no pulmonary embolism but groundglass opacity bilaterally most notably in the right upper lobe, suggestive of bronchopneumonia  - Placed on IV vancomycin and cefepime,  - follow urine legionella antigen, urine strep antigen, sputum culture, blood cultures   Active Problems: UTI - UA positive for UTI, continue current IV antibiotics, follow urine culture and sensitivities   Dehydration - UA positive for ketones, we'll continue gentle hydration     Diabetes mellitus with renal manifestation - Placed on Carb modified diet, sliding scale insulin     Leukocytosis: Likely due to #1  and #2, continue gentle hydration, IV antibiotics  - Continue to monitor CBC, follow cultures     Unspecified hypothyroidism - Continue Synthroid   DVT prophylaxis:  Lovenox   CODE STATUS: full code status   Family Communication: Admission, patients condition and plan of care including tests being ordered have been discussed with the patient  who indicates understanding and agree with the plan and Code Status   Further plan will depend as patient's clinical course evolves and further radiologic and laboratory data become available.   Time Spent on Admission: 1 hour  Joneric Streight Krystal Eaton M.D. [facility] 12/20/2013, 3:03 PM Pager: [phone]  If 7PM-7AM, please contact night-coverage [email or web address] Password [email or web address]  **Disclaimer: This note was dictated with voice recognition software. Similar sounding words can inadvertently be transcribed and this note may contain transcription errors which may not have been corrected upon publication of note.**

## 2013-12-20 NOTE — Evaluation (Signed)
Physical Therapy Evaluation Patient Details Name: Travis Bradford MRN: [id number] DOB: Mar 03, 1926 Today's Date: 12/20/2013   History of Present Illness  80 yo male admitted with Pna, UTI.  PMHx of Hyperlipidemia and borderline Type 2 DM, and Prostate Cancer S/P Laser TURP, restless legs syndrome. Recent admit 5/15; d/c 5/18. Pt lives alone.   Clinical Impression      Follow Up Recommendations Home health PT;No PT follow up (depending on progress)    Equipment Recommendations  None recommended by PT    Recommendations for Other Services OT consult     Precautions / Restrictions Precautions Precautions: Fall Restrictions Weight Bearing Restrictions: No      Mobility  Bed Mobility                  Transfers Overall transfer level: Modified independent                  Ambulation/Gait Ambulation/Gait assistance: Min guard Ambulation Distance (Feet): 350 Feet Assistive device:  (IV pole) Gait Pattern/deviations: Step-through pattern     General Gait Details: slightly unsteady at times however pt self corrected. Relied on IV pole for support  Stairs            Wheelchair Mobility    Modified Rankin (Stroke Patients Only)       Balance                                             Pertinent Vitals/Pain 97% 2L O2 at rest 95% RA at rest 88% RA during ambulation    Home Living Family/patient expects to be discharged to:: Private residence Living Arrangements: Alone Available Help at Discharge: Family;Available PRN/intermittently Type of Home: House Home Access: Stairs to enter   [employer] of Steps: "a couple" Home Layout: One level        Prior Function Level of Independence: Independent               Hand Dominance        Extremity/Trunk Assessment   Upper Extremity Assessment: Generalized weakness           Lower Extremity Assessment: Generalized weakness      Cervical / Trunk  Assessment: Normal  Communication   Communication: No difficulties  Cognition Arousal/Alertness: Awake/alert Behavior During Therapy: WFL for tasks assessed/performed Overall Cognitive Status: Within Functional Limits for tasks assessed                      General Comments      Exercises        Assessment/Plan    PT Assessment Patient needs continued PT services  PT Diagnosis Difficulty walking   PT Problem List Decreased strength;Decreased activity tolerance;Decreased balance;Decreased mobility  PT Treatment Interventions DME instruction;Gait training;Stair training;Functional mobility training;Therapeutic activities;Therapeutic exercise;Patient/family education;Balance training   PT Goals (Current goals can be found in the Care Plan section) Acute Rehab PT Goals Patient Stated Goal: home. return to PLOF PT Goal Formulation: With patient Time For Goal Achievement: 01/03/14 Potential to Achieve Goals: Good    Frequency Min 3X/week   Barriers to discharge        Co-evaluation               End of Session   Activity Tolerance: Patient tolerated treatment well Patient left: in bed;with call bell/phone within reach (sitting EOB)  Time: 1287-8676 PT Time Calculation (min): 21 min   Charges:   PT Evaluation $Initial PT Evaluation Tier I: 1 Procedure PT Treatments $Gait Training: 8-22 mins   PT G Codes:          Weston Anna, MPT Pager: [phone]

## 2013-12-20 NOTE — ED Notes (Signed)
Pt was recently admitted for PNA week ago.  And was fine after being discharged. Yesterday pt swam then went to [employer]. But then pt coughed all night,  fever of 100.3, and 89-90% O2 sat and increased HR, family member knew something wasn't right.

## 2013-12-21 DIAGNOSIS — J189 Pneumonia, unspecified organism: Secondary | ICD-10-CM | POA: Diagnosis not present

## 2013-12-21 LAB — LEGIONELLA ANTIGEN, URINE: Legionella Antigen, Urine: NEGATIVE

## 2013-12-21 LAB — CBC
HEMATOCRIT: 33.6 % — AB (ref 39.0–52.0)
HEMOGLOBIN: 11.2 g/dL — AB (ref 13.0–17.0)
MCH: 32.7 pg (ref 26.0–34.0)
MCHC: 33.3 g/dL (ref 30.0–36.0)
MCV: 98 fL (ref 78.0–100.0)
Platelets: 286 10*3/uL (ref 150–400)
RBC: 3.43 MIL/uL — ABNORMAL LOW (ref 4.22–5.81)
RDW: 13.4 % (ref 11.5–15.5)
WBC: 18.1 10*3/uL — ABNORMAL HIGH (ref 4.0–10.5)

## 2013-12-21 LAB — URINE CULTURE
COLONY COUNT: NO GROWTH
Colony Count: NO GROWTH
Culture: NO GROWTH
Culture: NO GROWTH

## 2013-12-21 LAB — BASIC METABOLIC PANEL
BUN: 25 mg/dL — ABNORMAL HIGH (ref 6–23)
CO2: 24 meq/L (ref 19–32)
Calcium: 9.4 mg/dL (ref 8.4–10.5)
Chloride: 101 mEq/L (ref 96–112)
Creatinine, Ser: 0.99 mg/dL (ref 0.50–1.35)
GFR calc Af Amer: 84 mL/min — ABNORMAL LOW (ref >90)
GFR calc non Af Amer: 73 mL/min — ABNORMAL LOW (ref >90)
GLUCOSE: 101 mg/dL — AB (ref 70–99)
POTASSIUM: 4.4 meq/L (ref 3.7–5.3)
Sodium: 138 mEq/L (ref 137–147)

## 2013-12-21 MED ORDER — LEVOFLOXACIN IN D5W 750 MG/150ML IV SOLN
750.0000 mg | INTRAVENOUS | Status: DC
  Administered 2013-12-21: 750 mg via INTRAVENOUS
  Filled 2013-12-21 (×2): qty 150

## 2013-12-21 MED ORDER — POLYETHYLENE GLYCOL 3350 17 G PO PACK
17.0000 g | PACK | Freq: Every day | ORAL | Status: DC
  Administered 2013-12-21 – 2013-12-22 (×2): 17 g via ORAL
  Filled 2013-12-21 (×2): qty 1

## 2013-12-21 NOTE — Progress Notes (Signed)
Physical Therapy Treatment Patient Details Name: Travis Bradford MRN: [id number] DOB: [birth year] Today's Date: 12/21/2013    History of Present Illness Pt is an 80 y.o. male with a recent diagnosis of Hypothyroidism, and PMHx of Hyperlipidemia and borderline Type 2 DM, and Prostate Cancer S/P Laser TURP, restless legs syndrome admitted 5/15 with complaints of night sweats and chills nightly for 1 week.  Pt found to be hypoxic and hypotensive and with probable diagnosis of septic shock.    PT Comments    Progressing well with mobility. Noted some staggering during ambulation today. Pt is slightly unsteady but no over losses of balance. Could possibly benefit from HHPT follow up at discharge.   Follow Up Recommendations  Home health PT (for high level balance training-if pt is agreeable)     Equipment Recommendations  None recommended by PT    Recommendations for Other Services       Precautions / Restrictions Precautions Precautions: Fall Restrictions Weight Bearing Restrictions: No    Mobility  Bed Mobility                  Transfers Overall transfer level: Modified independent                  Ambulation/Gait Ambulation/Gait assistance: Min guard Ambulation Distance (Feet): 300 Feet (x2) Assistive device: None Gait Pattern/deviations: Decreased stride length;Staggering right;Staggering left;Drifts right/left     General Gait Details: Increased staggering initially with ambulation. Improved with distance but still noted some drifting.    Stairs            Wheelchair Mobility    Modified Rankin (Stroke Patients Only)       Balance Overall balance assessment: Needs assistance             Standing balance comment: 360 degree turn R,L, EO/EC, narrow BOS-no difficulty.              High level balance activites: Backward walking;Turns High Level Balance Comments: Pt stood and removed socks in order to put on shoes-braced against  cabinet door and used 1 hand support to stabilize. Backwards walking while maneuvering with IV pole observed as well.     Cognition Arousal/Alertness: Awake/alert Behavior During Therapy: WFL for tasks assessed/performed Overall Cognitive Status: Within Functional Limits for tasks assessed                      Exercises      General Comments        Pertinent Vitals/Pain No c/o pain    Home Living                      Prior Function            PT Goals (current goals can now be found in the care plan section) Progress towards PT goals: Progressing toward goals    Frequency  Min 3X/week    PT Plan Current plan remains appropriate    Co-evaluation             End of Session Equipment Utilized During Treatment: Gait belt Activity Tolerance: Patient tolerated treatment well Patient left: in chair;with call bell/phone within reach     Time: 1445-1510 PT Time Calculation (min): 25 min  Charges:  $Gait Training: 23-37 mins                    G Codes:      Travis Bradford, MPT Pager:  [phone]    

## 2013-12-21 NOTE — Progress Notes (Signed)
[name]  Travis Bradford NKN:[id number] DOB: August 20, 1925 DOA: 12/20/2013 PCP: No PCP Per Patient  Assessment/Plan: Principal Problem:   HCAP (healthcare-associated pneumonia) - Transition to Levaquin given that patient has been afebrile within the last 24 hours. - much improvement on broad spectrum antibiotics. WBC levels down and patient breathing comfortably on room air.  Active Problems:   Diabetes mellitus with renal manifestation - relatively well controlled on diabetic diet.    Leukocytosis - likely combination of UTI and PNA    Unspecified hypothyroidism - stable, continue synthroid    UTI (lower urinary tract infection) - Awaiting urine culture - Continue antibiotics and narrow based on sensitivities. Of note levaquin is usually good agent for this.  Code Status: full  Family Communication: discussed with son  Disposition Plan: Pending improvement in respiratory condition and continued resolution of leukocytosis   Consultants:  None  Procedures:  none  Antibiotics:  On Vanc and Cefepime>>>5/28  Levaquin 5/28  HPI/Subjective: Pt feels better. Son had many questions regarding father's condition of which I answered to his satisfaction  Objective: Filed Vitals:   12/21/13 1418  BP: 110/60  Pulse: 59  Temp: 97.9 F (36.6 C)  Resp: 24    Intake/Output Summary (Last 24 hours) at 12/21/13 1659 Last data filed at 12/21/13 0700  Gross per 24 hour  Intake 1482.5 ml  Output    750 ml  Net  732.5 ml   Filed Weights   12/20/13 1731  Weight: 95.255 kg (210 lb)    Exam:   General:  Pt in NAD, alert and awake  Cardiovascular: RRR, no MRG  Respiratory: rhales at RL base, no wheezes  Abdomen: soft, NT, ND  Musculoskeletal: no cyanosis or clubbing   Data Reviewed: Basic Metabolic Panel:  Recent Labs Lab 12/20/13 1050 12/21/13 0445  NA 136* 138  K 4.3 4.4  CL 97 101  CO2 24 24  GLUCOSE 165* 101*  BUN 25* 25*   CREATININE 0.93 0.99  CALCIUM 10.2 9.4   Liver Function Tests: No results found for this basename: AST, ALT, ALKPHOS, BILITOT, PROT, ALBUMIN,  in the last 168 hours No results found for this basename: LIPASE, AMYLASE,  in the last 168 hours No results found for this basename: AMMONIA,  in the last 168 hours CBC:  Recent Labs Lab 12/20/13 1050 12/21/13 0445  WBC 34.9* 18.1*  NEUTROABS 33.5*  --   HGB 13.3 11.2*  HCT 39.9 33.6*  MCV 97.3 98.0  PLT 349 286   Cardiac Enzymes:  Recent Labs Lab 12/20/13 1050  TROPONINI <0.30   BNP (last 3 results)  Recent Labs  12/09/13 0125 12/20/13 1050  PROBNP 374.6 216.4   CBG: No results found for this basename: GLUCAP,  in the last 168 hours  Recent Results (from the past 240 hour(s))  CULTURE, BLOOD (ROUTINE X 2)     Status: None   Collection Time    12/20/13 11:55 AM      Result Value Ref Range Status   Specimen Description BLOOD RIGHT FOREARM   Final   Special Requests BOTTLES DRAWN AEROBIC AND ANAEROBIC 5ML   Final   Culture  Setup Time     Final   Value: 12/20/2013 17:39     Performed at [facility]   Culture     Final   Value:        BLOOD CULTURE RECEIVED NO GROWTH TO DATE CULTURE WILL BE HELD FOR 5 DAYS BEFORE ISSUING  A FINAL NEGATIVE REPORT     Performed at [facility]   Report Status PENDING   Incomplete  CULTURE, BLOOD (ROUTINE X 2)     Status: None   Collection Time    12/20/13 12:05 PM      Result Value Ref Range Status   Specimen Description BLOOD RIGHT ANTECUBITAL   Final   Special Requests BOTTLES DRAWN AEROBIC AND ANAEROBIC 5ML   Final   Culture  Setup Time     Final   Value: 12/20/2013 17:40     Performed at [facility]   Culture     Final   Value:        BLOOD CULTURE RECEIVED NO GROWTH TO DATE CULTURE WILL BE HELD FOR 5 DAYS BEFORE ISSUING A FINAL NEGATIVE REPORT     Performed at [facility]   Report Status PENDING   Incomplete  URINE CULTURE     Status: None    Collection Time    12/20/13 12:25 PM      Result Value Ref Range Status   Specimen Description URINE, CLEAN CATCH   Final   Special Requests NONE   Final   Culture  Setup Time     Final   Value: 12/20/2013 15:16     Performed at [place]     Final   Value: NO GROWTH     Performed at [facility]   Culture     Final   Value: NO GROWTH     Performed at [facility]   Report Status 12/21/2013 FINAL   Final     Studies: Dg Chest 2 View  12/20/2013   CLINICAL DATA:  Dyspnea  EXAM: CHEST  2 VIEW  COMPARISON:  Dec 08, 2013  FINDINGS: There is persistent interstitial pneumonitis bilaterally. There appears to be some rather minimal clearing in the right upper lobe compared to the prior study. No other changes seen in the lung parenchyma. There is no new opacity. Heart size and pulmonary vascularity are normal. No adenopathy. There is atherosclerotic change in the aorta. There is degenerative change in the thoracic spine.  IMPRESSION: Slight interval clearing of opacity from the right upper lobe compared to prior study. Otherwise no change. There is patchy interstitial pneumonitis bilaterally. No new opacity.   Electronically Signed   By: Lowella Grip M.D.   On: 12/20/2013 10:56   Ct Angio Chest W/cm &/or Wo Cm  12/20/2013   CLINICAL DATA:  With local  EXAM: CT ANGIOGRAPHY CHEST WITH CONTRAST  TECHNIQUE: Multidetector CT imaging of the chest was performed using the standard protocol during bolus administration of intravenous contrast. Multiplanar CT image reconstructions and MIPs were obtained to evaluate the vascular anatomy.  CONTRAST:  167mL OMNIPAQUE IOHEXOL 350 MG/ML SOLN  COMPARISON:  Chest radiograph Dec 20, 2013  FINDINGS: There is no demonstrable pulmonary embolus. There is no thoracic aortic aneurysm or dissection. There is atherosclerotic change in the aorta, however.  There are patchy areas of ground-glass type opacity throughout the lungs, most  notably in the right upper lobe region. There is no well-defined segmental consolidation. There is underlying emphysematous change with the areas of patchy atelectasis bilaterally.  There is no appreciable thoracic adenopathy. There are scattered small lymph nodes throughout the mediastinum which do not meet size criteria for pathologic significance.  Pericardium is not thickened. There are multiple foci of coronary artery calcification.  In the visualized upper  abdomen, there are multiple gallstones within the gallbladder. The gallbladder wall is not appreciably thickened. There is evidence of fatty change in the liver. There are no blastic or lytic bone lesions. There is degenerative change in the thoracic spine. Thyroid appears within normal limits.  Review of the MIP images confirms the above findings.  IMPRESSION: No demonstrable pulmonary embolus. Patchy areas of ground-glass type opacity bilaterally, most notably in the right upper lobe. This distribution and appearance is suggestive of bronchopneumonia. There is no segmental consolidation, however. Given this appearance, a followup study in approximately 4 weeks to assess for clearing advised. No appreciable adenopathy.  There is cholelithiasis. There is fatty liver. There is extensive coronary artery calcification.   Electronically Signed   By: Lowella Grip M.D.   On: 12/20/2013 13:42    Scheduled Meds: . aspirin EC  81 mg Oral Daily  . benzonatate  100 mg Oral TID  . enoxaparin (LOVENOX) injection  40 mg Subcutaneous Q24H  . levofloxacin (LEVAQUIN) IV  750 mg Intravenous Q24H  . levothyroxine  100 mcg Oral QAC breakfast  . multivitamin with minerals  1 tablet Oral Daily  . omega-3 acid ethyl esters  2 g Oral Daily  . rOPINIRole  3 mg Oral QHS  . simvastatin  40 mg Oral QPM   Continuous Infusions: . sodium chloride 75 mL/hr at 12/21/13 0616      Time spent: > 35 minutes    [facility] Pager [phone] If  7PM-7AM, please contact night-coverage at [email or web address], password [facility] 12/21/2013, 4:59 PM  LOS: 1 day

## 2013-12-21 NOTE — Progress Notes (Signed)
ANTIBIOTIC CONSULT NOTE - INITIAL  Pharmacy Consult for Levaquin  Indication: CAP  No Known Allergies  Patient Measurements: Height: 5' 9.29" (176 cm) Weight: 210 lb (95.255 kg) IBW/kg (Calculated) : 71.37 Total body weight: 98 kg  Vital Signs: Temp: 97.9 F (36.6 C) (05/28 1418) Temp src: Oral (05/28 1418) BP: 110/60 mmHg (05/28 1418) Pulse Rate: 59 (05/28 1418) Intake/Output from previous day: 05/27 0701 - 05/28 0700 In: 1482.5 [P.O.:120; I.V.:1162.5; IV Piggyback:200] Out: 950 [Urine:950] Intake/Output from this shift:   Labs:  Recent Labs  12/20/13 1050 12/21/13 0445  WBC 34.9* 18.1*  HGB 13.3 11.2*  PLT 349 286  CREATININE 0.93 0.99   Estimated Creatinine Clearance: 62.5 ml/min (by C-G formula based on Cr of 0.99). No results found for this basename: VANCOTROUGH, Corlis Leak, VANCORANDOM, [place], GENTPEAK, GENTRANDOM, TOBRATROUGH, TOBRAPEAK, TOBRARND, AMIKACINPEAK, AMIKACINTROU, AMIKACIN,  in the last 72 hours   Microbiology: Recent Results (from the past 720 hour(s))  MRSA PCR SCREENING     Status: None   Collection Time    12/09/13  1:10 AM      Result Value Ref Range Status   MRSA by PCR NEGATIVE  NEGATIVE Final   Comment:            The GeneXpert MRSA Assay (FDA     approved for NASAL specimens     only), is one component of a     comprehensive MRSA colonization     surveillance program. It is not     intended to diagnose MRSA     infection nor to guide or     monitor treatment for     MRSA infections.  CULTURE, BLOOD (ROUTINE X 2)     Status: None   Collection Time    12/20/13 11:55 AM      Result Value Ref Range Status   Specimen Description BLOOD RIGHT FOREARM   Final   Special Requests BOTTLES DRAWN AEROBIC AND ANAEROBIC 5ML   Final   Culture  Setup Time     Final   Value: 12/20/2013 17:39     Performed at [facility]   Culture     Final   Value:        BLOOD CULTURE RECEIVED NO GROWTH TO DATE CULTURE WILL BE HELD FOR 5 DAYS  BEFORE ISSUING A FINAL NEGATIVE REPORT     Performed at [facility]   Report Status PENDING   Incomplete  CULTURE, BLOOD (ROUTINE X 2)     Status: None   Collection Time    12/20/13 12:05 PM      Result Value Ref Range Status   Specimen Description BLOOD RIGHT ANTECUBITAL   Final   Special Requests BOTTLES DRAWN AEROBIC AND ANAEROBIC 5ML   Final   Culture  Setup Time     Final   Value: 12/20/2013 17:40     Performed at [facility]   Culture     Final   Value:        BLOOD CULTURE RECEIVED NO GROWTH TO DATE CULTURE WILL BE HELD FOR 5 DAYS BEFORE ISSUING A FINAL NEGATIVE REPORT     Performed at [facility]   Report Status PENDING   Incomplete  URINE CULTURE     Status: None   Collection Time    12/20/13 12:25 PM      Result Value Ref Range Status   Specimen Description URINE, CLEAN CATCH   Final   Special Requests NONE  Final   Culture  Setup Time     Final   Value: 12/20/2013 15:16     Performed at [place]     Final   Value: NO GROWTH     Performed at [facility]   Culture     Final   Value: NO GROWTH     Performed at [facility]   Report Status 12/21/2013 FINAL   Final   Medical History: Past Medical History  Diagnosis Date  . Hyperlipidemia   . BPH (benign prostatic hyperplasia)   . Lymphoma     15 years ago  . Arthritis   . History of blood transfusion   . Pneumonia   . Prostate cancer   . Hypothyroid    Medications:  Anti-infectives   Start     Dose/Rate Route Frequency Ordered Stop   12/21/13 1600  levofloxacin (LEVAQUIN) IVPB 750 mg     750 mg 100 mL/hr over 90 Minutes Intravenous Every 24 hours 12/21/13 1419     12/21/13 0200  vancomycin (VANCOCIN) IVPB 750 mg/150 ml premix  Status:  Discontinued     750 mg 150 mL/hr over 60 Minutes Intravenous Every 12 hours 12/20/13 1537 12/21/13 1416   12/20/13 1600  ceFEPIme (MAXIPIME) 1 g in dextrose 5 % 50 mL IVPB  Status:  Discontinued     1  g 100 mL/hr over 30 Minutes Intravenous Every 8 hours 12/20/13 1550 12/21/13 1415   12/20/13 1530  ceFEPIme (MAXIPIME) 1 g in dextrose 5 % 50 mL IVPB  Status:  Discontinued     1 g 100 mL/hr over 30 Minutes Intravenous 3 times per day 12/20/13 1523 12/20/13 1539   12/20/13 1500  ceFEPIme (MAXIPIME) 1 g in dextrose 5 % 50 mL IVPB  Status:  Discontinued     1 g 100 mL/hr over 30 Minutes Intravenous Every 12 hours 12/20/13 1403 12/20/13 1550   12/20/13 1400  vancomycin (VANCOCIN) IVPB 1000 mg/200 mL premix     1,000 mg 200 mL/hr over 60 Minutes Intravenous  Once 12/20/13 1355 12/20/13 1506   12/20/13 1230  cefTRIAXone (ROCEPHIN) 1 g in dextrose 5 % 50 mL IVPB     1 g 100 mL/hr over 30 Minutes Intravenous  Once 12/20/13 1224 12/20/13 1357     Assessment: 56 yoM with probable HCAP: recent admit 5/15 for CAP treated with Rocephin/Zithromax. Developed cough overnight, febrile, decreased O2 sats.  Vancomycin x1 dose ordered by ED physician, then pharmacy to dose and Cefepime per pharmacy  Antibiotics changed to Levaquin, d/c Vancomycin/Cefepime  Goal of Therapy:  Antibiotic dosing appropriate for disease state, renal function   Plan:   Levaquin 750mg  q24 hr  Minda Ditto PharmD Pager [phone] 12/21/2013, 3:10 PM

## 2013-12-22 DIAGNOSIS — J189 Pneumonia, unspecified organism: Secondary | ICD-10-CM | POA: Diagnosis not present

## 2013-12-22 LAB — CBC WITH DIFFERENTIAL/PLATELET
BASOS ABS: 0 10*3/uL (ref 0.0–0.1)
Basophils Relative: 0 % (ref 0–1)
Eosinophils Absolute: 0.7 10*3/uL (ref 0.0–0.7)
Eosinophils Relative: 8 % — ABNORMAL HIGH (ref 0–5)
HEMATOCRIT: 36.4 % — AB (ref 39.0–52.0)
HEMOGLOBIN: 12.2 g/dL — AB (ref 13.0–17.0)
LYMPHS PCT: 20 % (ref 12–46)
Lymphs Abs: 1.7 10*3/uL (ref 0.7–4.0)
MCH: 32.6 pg (ref 26.0–34.0)
MCHC: 33.5 g/dL (ref 30.0–36.0)
MCV: 97.3 fL (ref 78.0–100.0)
MONOS PCT: 7 % (ref 3–12)
Monocytes Absolute: 0.6 10*3/uL (ref 0.1–1.0)
NEUTROS ABS: 5.8 10*3/uL (ref 1.7–7.7)
Neutrophils Relative %: 65 % (ref 43–77)
Platelets: 337 10*3/uL (ref 150–400)
RBC: 3.74 MIL/uL — AB (ref 4.22–5.81)
RDW: 13.3 % (ref 11.5–15.5)
WBC: 8.8 10*3/uL (ref 4.0–10.5)

## 2013-12-22 MED ORDER — LEVOFLOXACIN 500 MG PO TABS: 500.0000 mg | ORAL_TABLET | Freq: Every day | ORAL | Status: DC

## 2013-12-22 MED ORDER — BENZONATATE 100 MG PO CAPS: 100.0000 mg | ORAL_CAPSULE | Freq: Three times a day (TID) | ORAL | Status: DC

## 2013-12-22 NOTE — Progress Notes (Signed)
CARE MANAGEMENT NOTE 12/22/2013  Patient:  Travis Bradford, Travis Bradford   Account Number:  [id number]  Date Initiated:  12/20/2013  Documentation initiated by:  Dessa Phi  Subjective/Objective Assessment:   80 Y/O M ADMITTED W/PNA.UTI,DEHYDRATION.READMIT-5/15-5/18-PNA.HX:NHL,PROSTATE CA,TURP.     Action/Plan:   FROM HOME ALONE.HAS PCP,PHARMACY.   Anticipated DC Date:  12/22/2013   Anticipated DC Plan:  [place]  CM consult  Patient refused services      Choice offered to / List presented to:             Status of service:  Completed, signed off Medicare Important Message given?   (If response is "NO", the following Medicare IM given date fields will be blank) Date Medicare IM given:   Date Additional Medicare IM given:    Discharge Disposition:  HOME/SELF CARE  Per UR Regulation:  Reviewed for med. necessity/level of care/duration of stay  If discussed at [place] of Stay Meetings, dates discussed:    Comments:  12/22/13 Janiesha Diehl MAHABIER RN,BSN NCM [phone] D/C HOME NO [place].  12/21/13 Abdoulie Tierce RN,BSN NCM [phone] PT-HH.SPOKE TO PATIENT/SON IN RM ABOUT D/C PLANS.INFORMED OF RECOMMENDATION OF HHPT.SON DECLINES HHC,SAYS HE HAS PRIVATE DUTY ASSISTANCE, & ALL THE EQUIPMENT NEEDED.NO FURTHER D/C NEEDS.  12/20/13 Daisja Kessinger RN,BSN NCM [phone] WOULD RECOMMEND PT/OT CONS.

## 2013-12-22 NOTE — Discharge Summary (Signed)
Physician Discharge Summary  Travis Bradford VEL:[id number] DOB: Oct 19, 1925 DOA: 12/20/2013  PCP: No PCP Per Patient  Admit date: 12/20/2013 Discharge date: 12/22/2013  Time spent: > 35 minutes  Recommendations for Outpatient Follow-up:  1. On post hospital follow up please decide whether or not to prolong antibiotic regimen.  He will be discharged on 7 more days of antibiotics to complete a 10 day total course.  Discharge Diagnoses:  Principal Problem:   HCAP (healthcare-associated pneumonia) Active Problems:   Diabetes mellitus with renal manifestation   Leukocytosis   Unspecified hypothyroidism   UTI (lower urinary tract infection)   Discharge Condition: stable  Diet recommendation: carb modified  Filed Weights   12/20/13 1731  Weight: 95.255 kg (210 lb)    History of present illness:  Pt is a 80 y/o with h/o hypothyroidism, HPL, DM, prostate cancer s/p laser TURP with remote h/o NHL 15 yrs ago now in remission. Pt had recent admission into the hospital for PNA.  Presented to the ED complaining of coughing and chills.  Hospital Course:  Principal Problem:  HCAP (healthcare-associated pneumonia)  - Pt will be d/c'd on levaquin to complete a total course of 10 days of antibiotics. - WBC levels down and patient breathing comfortably on room air.   Active Problems:  Diabetes mellitus with renal manifestation  - relatively well controlled on diabetic diet.   Leukocytosis  - likely secondary to PNA   Unspecified hypothyroidism  - stable, continue synthroid   UTI (lower urinary tract infection)  - Urine culture showing no growth  Procedures:  None  Consultations:  None  Discharge Exam: Filed Vitals:   12/22/13 1408  BP: 144/56  Pulse: 66  Temp: 98 F (36.7 C)  Resp: 20    General: Pt in NAD, alert and awake Cardiovascular: pink extremities, no cyanosis Respiratory: no increased wob on room air, no wheezes  Discharge Instructions You were cared for by  a hospitalist during your hospital stay. If you have any questions about your discharge medications or the care you received while you were in the hospital after you are discharged, you can call the unit and asked to speak with the hospitalist on call if the hospitalist that took care of you is not available. Once you are discharged, your primary care physician will handle any further medical issues. Please note that NO REFILLS for any discharge medications will be authorized once you are discharged, as it is imperative that you return to your primary care physician (or establish a relationship with a primary care physician if you do not have one) for your aftercare needs so that they can reassess your need for medications and monitor your lab values.  Discharge Instructions   Call MD for:  difficulty breathing, headache or visual disturbances    Complete by:  As directed      Call MD for:  temperature >100.4    Complete by:  As directed      Diet - low sodium heart healthy    Complete by:  As directed      Discharge instructions    Complete by:  As directed   Please be sure to follow up with your pcp in 1-2 weeks or sooner should any new concerns arise.     Increase activity slowly    Complete by:  As directed             Medication List         aspirin 81 MG tablet  Take 81 mg by mouth daily.     benzonatate 100 MG capsule  Commonly known as:  TESSALON  Take 1 capsule (100 mg total) by mouth 3 (three) times daily.     fish oil-omega-3 fatty acids 1000 MG capsule  Take 2 g by mouth daily.     glucosamine-chondroitin 500-400 MG tablet  Take 1 tablet by mouth daily.     levofloxacin 500 MG tablet  Commonly known as:  LEVAQUIN  Take 1 tablet (500 mg total) by mouth daily.     levothyroxine 100 MCG tablet  Commonly known as:  SYNTHROID, LEVOTHROID  Take 100 mcg by mouth daily.     metFORMIN 500 MG tablet  Commonly known as:  GLUCOPHAGE  Take 1 tablet (500 mg total) by mouth 2  (two) times daily with a meal.     MSM 500 MG Caps  Take 1 capsule by mouth daily.     multivitamin with minerals Tabs tablet  Take 1 tablet by mouth daily.     rOPINIRole 3 MG tablet  Commonly known as:  REQUIP  Take 3 mg by mouth at bedtime.     simvastatin 40 MG tablet  Commonly known as:  ZOCOR  Take 40 mg by mouth daily before breakfast.       No Known Allergies    The results of significant diagnostics from this hospitalization (including imaging, microbiology, ancillary and laboratory) are listed below for reference.    Significant Diagnostic Studies: Dg Chest 2 View  12/20/2013   CLINICAL DATA:  Dyspnea  EXAM: CHEST  2 VIEW  COMPARISON:  Dec 08, 2013  FINDINGS: There is persistent interstitial pneumonitis bilaterally. There appears to be some rather minimal clearing in the right upper lobe compared to the prior study. No other changes seen in the lung parenchyma. There is no new opacity. Heart size and pulmonary vascularity are normal. No adenopathy. There is atherosclerotic change in the aorta. There is degenerative change in the thoracic spine.  IMPRESSION: Slight interval clearing of opacity from the right upper lobe compared to prior study. Otherwise no change. There is patchy interstitial pneumonitis bilaterally. No new opacity.   Electronically Signed   By: Lowella Grip M.D.   On: 12/20/2013 10:56   Dg Chest 2 View  12/08/2013   CLINICAL DATA:  Shortness of breath.  EXAM: CHEST  2 VIEW  COMPARISON:  DG CHEST 1V PORT dated 12/08/2013  FINDINGS: The lungs are adequately inflated. The interstitial markings remain increased but are slightly less conspicuous than on the earlier portable study. Subtle near confluent density in the right upper lobe persists. The cardiac silhouette is not enlarged. The pulmonary vascularity is not engorged. There is no pleural effusion.  IMPRESSION: Increased pulmonary interstitial markings bilaterally likely reflects interstitial pneumonia.  There is no significant pulmonary vascular congestion nor evidence of true enlargement of the cardiac silhouette.   Electronically Signed   By: David  Martinique   On: 12/08/2013 22:49   Ct Angio Chest W/cm &/or Wo Cm  12/20/2013   CLINICAL DATA:  With local  EXAM: CT ANGIOGRAPHY CHEST WITH CONTRAST  TECHNIQUE: Multidetector CT imaging of the chest was performed using the standard protocol during bolus administration of intravenous contrast. Multiplanar CT image reconstructions and MIPs were obtained to evaluate the vascular anatomy.  CONTRAST:  135mL OMNIPAQUE IOHEXOL 350 MG/ML SOLN  COMPARISON:  Chest radiograph Dec 20, 2013  FINDINGS: There is no demonstrable pulmonary embolus. There is no thoracic aortic aneurysm  or dissection. There is atherosclerotic change in the aorta, however.  There are patchy areas of ground-glass type opacity throughout the lungs, most notably in the right upper lobe region. There is no well-defined segmental consolidation. There is underlying emphysematous change with the areas of patchy atelectasis bilaterally.  There is no appreciable thoracic adenopathy. There are scattered small lymph nodes throughout the mediastinum which do not meet size criteria for pathologic significance.  Pericardium is not thickened. There are multiple foci of coronary artery calcification.  In the visualized upper abdomen, there are multiple gallstones within the gallbladder. The gallbladder wall is not appreciably thickened. There is evidence of fatty change in the liver. There are no blastic or lytic bone lesions. There is degenerative change in the thoracic spine. Thyroid appears within normal limits.  Review of the MIP images confirms the above findings.  IMPRESSION: No demonstrable pulmonary embolus. Patchy areas of ground-glass type opacity bilaterally, most notably in the right upper lobe. This distribution and appearance is suggestive of bronchopneumonia. There is no segmental consolidation, however.  Given this appearance, a followup study in approximately 4 weeks to assess for clearing advised. No appreciable adenopathy.  There is cholelithiasis. There is fatty liver. There is extensive coronary artery calcification.   Electronically Signed   By: Lowella Grip M.D.   On: 12/20/2013 13:42   Dg Chest Portable 1 View  12/08/2013   CLINICAL DATA:  Wheezing and shortness of breath  EXAM: PORTABLE CHEST - 1 VIEW  COMPARISON:  None.  FINDINGS: The lungs are well-expanded. The pulmonary interstitial markings are increased diffusely. There is subtle confluent density in the right upper lobe The cardiopericardial silhouette is top-normal in size. The pulmonary vascularity is not clearly engorged. There is no pleural effusion. The mediastinum is normal in width. There is mild tortuosity of the descending thoracic aorta.  IMPRESSION: Increased interstitial markings bilaterally may reflect interstitial edema of cardiac or noncardiac calls. Interstitial pneumonia is in the differential as well. There is no alveolar pneumonia demonstrated. When the patient can tolerate the procedure, a PA and lateral chest x-ray would be of value.   Electronically Signed   By: David  Martinique   On: 12/08/2013 21:34    Microbiology: Recent Results (from the past 240 hour(s))  CULTURE, BLOOD (ROUTINE X 2)     Status: None   Collection Time    12/20/13 11:55 AM      Result Value Ref Range Status   Specimen Description BLOOD RIGHT FOREARM   Final   Special Requests BOTTLES DRAWN AEROBIC AND ANAEROBIC 5ML   Final   Culture  Setup Time     Final   Value: 12/20/2013 17:39     Performed at [facility]   Culture     Final   Value:        BLOOD CULTURE RECEIVED NO GROWTH TO DATE CULTURE WILL BE HELD FOR 5 DAYS BEFORE ISSUING A FINAL NEGATIVE REPORT     Performed at [facility]   Report Status PENDING   Incomplete  CULTURE, BLOOD (ROUTINE X 2)     Status: None   Collection Time    12/20/13 12:05 PM      Result  Value Ref Range Status   Specimen Description BLOOD RIGHT ANTECUBITAL   Final   Special Requests BOTTLES DRAWN AEROBIC AND ANAEROBIC 5ML   Final   Culture  Setup Time     Final   Value: 12/20/2013 17:40     Performed  at [facility]   Culture     Final   Value:        BLOOD CULTURE RECEIVED NO GROWTH TO DATE CULTURE WILL BE HELD FOR 5 DAYS BEFORE ISSUING A FINAL NEGATIVE REPORT     Performed at [facility]   Report Status PENDING   Incomplete  URINE CULTURE     Status: None   Collection Time    12/20/13 12:25 PM      Result Value Ref Range Status   Specimen Description URINE, CLEAN CATCH   Final   Special Requests NONE   Final   Culture  Setup Time     Final   Value: 12/20/2013 15:16     Performed at [place]     Final   Value: NO GROWTH     Performed at [facility]   Culture     Final   Value: NO GROWTH     Performed at [facility]   Report Status 12/21/2013 FINAL   Final  URINE CULTURE     Status: None   Collection Time    12/20/13  3:53 PM      Result Value Ref Range Status   Specimen Description URINE, CLEAN CATCH   Final   Special Requests NONE   Final   Culture  Setup Time     Final   Value: 12/20/2013 22:30     Performed at [place]     Final   Value: NO GROWTH     Performed at [facility]   Culture     Final   Value: NO GROWTH     Performed at [facility]   Report Status 12/21/2013 FINAL   Final     Labs: Basic Metabolic Panel:  Recent Labs Lab 12/20/13 1050 12/21/13 0445  NA 136* 138  K 4.3 4.4  CL 97 101  CO2 24 24  GLUCOSE 165* 101*  BUN 25* 25*  CREATININE 0.93 0.99  CALCIUM 10.2 9.4   Liver Function Tests: No results found for this basename: AST, ALT, ALKPHOS, BILITOT, PROT, ALBUMIN,  in the last 168 hours No results found for this basename: LIPASE, AMYLASE,  in the last 168 hours No results found for this basename: AMMONIA,  in the  last 168 hours CBC:  Recent Labs Lab 12/20/13 1050 12/21/13 0445 12/22/13 1245  WBC 34.9* 18.1* 8.8  NEUTROABS 33.5*  --  5.8  HGB 13.3 11.2* 12.2*  HCT 39.9 33.6* 36.4*  MCV 97.3 98.0 97.3  PLT 349 286 337   Cardiac Enzymes:  Recent Labs Lab 12/20/13 1050  TROPONINI <0.30   BNP: BNP (last 3 results)  Recent Labs  12/09/13 0125 12/20/13 1050  PROBNP 374.6 216.4   CBG: No results found for this basename: GLUCAP,  in the last 168 hours     Signed:  Velvet Bathe  [facility] 12/22/2013, 2:39 PM

## 2013-12-22 NOTE — Progress Notes (Signed)
PT Cancellation Note  ___Treatment cancelled today due to medical issues with patient which prohibited therapy  ___ Treatment cancelled today due to patient receiving procedure or test   ___ Treatment cancelled today due to patient's refusal to participate   _X_ Treatment cancelled per family request.  Pt is amb self around room and in hallway.  No acute PT needs indicated.  Will update LPT.  Rica Koyanagi  PTA [facility]  Acute  Rehab Pager      [phone]

## 2013-12-25 NOTE — ED Provider Notes (Signed)
CSN: [id number]     Arrival date & time 12/20/13  1950 History   First MD Initiated Contact with Patient 12/20/13 1013     Chief Complaint  Patient presents with  . Cough  . Fatigue     (Consider location/radiation/quality/duration/timing/severity/associated sxs/prior Treatment) HPI  80 year old male with history of hypothyroidism, hyperlipidemia, diabetes,Prostate Cancer S/P Laser TURP with remote history of NHL 15 years ago now in remission who was recently discharged from the hospital on 12/11/13 for community-acquired pneumonia. History was obtained from the patient who reported he was doing fairly well after the discharge until yesterday when he went for swimming in [facility] center. He came home and in the middle of the night while sleeping he started having intractable coughing. He stated that it finally settled down and he went back to sleep, woke up this morning at 9:00 am and was having fevers and chills with productive coughing, significant generalized weakness. His temp was 100.3 and his son checked his O2 sats with a portable pulse ox and showed 89-90% on room air   Past Medical History  Diagnosis Date  . Hyperlipidemia   . BPH (benign prostatic hyperplasia)   . Lymphoma     15 years ago  . Arthritis   . History of blood transfusion   . Pneumonia   . Prostate cancer   . Hypothyroid    Past Surgical History  Procedure Laterality Date  . Appendectomy    . Athroscopic knee surgery    . Mohs surgery      x 6  . Dental implants    . Green light laser turp (transurethral resection of prostate  08/29/2012    Procedure: GREEN LIGHT LASER TURP (TRANSURETHRAL RESECTION OF PROSTATE;  Surgeon: [name], MD;  Location: [facility];  Service: Urology;  Laterality: N/A;   Family History  Problem Relation Age of Onset  . CAD Mother   . Stroke Brother      X 2   History  Substance Use Topics  . Smoking status: Former Research scientist (life sciences)  . Smokeless tobacco: Never Used  .  Alcohol Use: Yes     Comment: Rarely    Review of Systems  All systems reviewed and negative, other than as noted in HPI.   Allergies  Review of patient's allergies indicates no known allergies.  Home Medications   Prior to Admission medications   Medication Sig Start Date End Date Taking? Authorizing Provider  aspirin 81 MG tablet Take 81 mg by mouth daily.   Yes Historical Provider, MD  fish oil-omega-3 fatty acids 1000 MG capsule Take 2 g by mouth daily.   Yes Historical Provider, MD  glucosamine-chondroitin 500-400 MG tablet Take 1 tablet by mouth daily.   Yes Historical Provider, MD  levothyroxine (SYNTHROID, LEVOTHROID) 100 MCG tablet Take 100 mcg by mouth daily.   Yes Historical Provider, MD  metFORMIN (GLUCOPHAGE) 500 MG tablet Take 1 tablet (500 mg total) by mouth 2 (two) times daily with a meal. 12/11/13  Yes Annita Brod, MD  Methylsulfonylmethane (MSM) 500 MG CAPS Take 1 capsule by mouth daily.   Yes Historical Provider, MD  Multiple Vitamin (MULTIVITAMIN WITH MINERALS) TABS tablet Take 1 tablet by mouth daily.   Yes Historical Provider, MD  rOPINIRole (REQUIP) 3 MG tablet Take 3 mg by mouth at bedtime.   Yes Historical Provider, MD  simvastatin (ZOCOR) 40 MG tablet Take 40 mg by mouth daily before breakfast.    Yes Historical Provider, MD  benzonatate (TESSALON) 100 MG capsule Take 1 capsule (100 mg total) by mouth 3 (three) times daily. 12/22/13   Velvet Bathe, MD  levofloxacin (LEVAQUIN) 500 MG tablet Take 1 tablet (500 mg total) by mouth daily. 12/22/13   Velvet Bathe, MD   BP 144/56  Pulse 66  Temp(Src) 98 F (36.7 C) (Oral)  Resp 20  Ht 5' 9.29" (1.76 m)  Wt 210 lb (95.255 kg)  BMI 30.75 kg/m2  SpO2 96% Physical Exam  Nursing note and vitals reviewed. Constitutional: He appears well-developed and well-nourished. No distress.  HENT:  Head: Normocephalic and atraumatic.  Eyes: Conjunctivae are normal. Right eye exhibits no discharge. Left eye exhibits no  discharge.  Neck: Neck supple.  Cardiovascular: Regular rhythm and normal heart sounds.  Exam reveals no gallop and no friction rub.   No murmur heard. Mild tachycardia  Pulmonary/Chest: Effort normal and breath sounds normal. No respiratory distress.  Abdominal: Soft. He exhibits no distension. There is no tenderness.  Musculoskeletal: He exhibits no edema and no tenderness.  Lower extremities symmetric as compared to each other. No calf tenderness. Negative Homan's. No palpable cords.   Neurological: He is alert.  Skin: Skin is warm and dry.  Psychiatric: He has a normal mood and affect. His behavior is normal. Thought content normal.    ED Course  Procedures (including critical care time) Labs Review Labs Reviewed  CBC WITH DIFFERENTIAL - Abnormal; Notable for the following:    WBC 34.9 (*)    RBC 4.10 (*)    Neutrophils Relative % 96 (*)    Lymphocytes Relative 2 (*)    Monocytes Relative 2 (*)    Neutro Abs 33.5 (*)    All other components within normal limits  BASIC METABOLIC PANEL - Abnormal; Notable for the following:    Sodium 136 (*)    Glucose, Bld 165 (*)    BUN 25 (*)    GFR calc non Af Amer 74 (*)    GFR calc Af Amer 86 (*)    All other components within normal limits  URINALYSIS, ROUTINE W REFLEX MICROSCOPIC - Abnormal; Notable for the following:    Color, Urine AMBER (*)    APPearance CLOUDY (*)    Hgb urine dipstick SMALL (*)    Bilirubin Urine SMALL (*)    Ketones, ur 15 (*)    Protein, ur 30 (*)    Leukocytes, UA SMALL (*)    All other components within normal limits  URINE MICROSCOPIC-ADD ON - Abnormal; Notable for the following:    Bacteria, UA MANY (*)    All other components within normal limits  URINALYSIS, ROUTINE W REFLEX MICROSCOPIC - Abnormal; Notable for the following:    Specific Gravity, Urine >1.046 (*)    Hgb urine dipstick SMALL (*)    All other components within normal limits  BASIC METABOLIC PANEL - Abnormal; Notable for the  following:    Glucose, Bld 101 (*)    BUN 25 (*)    GFR calc non Af Amer 73 (*)    GFR calc Af Amer 84 (*)    All other components within normal limits  CBC - Abnormal; Notable for the following:    WBC 18.1 (*)    RBC 3.43 (*)    Hemoglobin 11.2 (*)    HCT 33.6 (*)    All other components within normal limits  URINE MICROSCOPIC-ADD ON - Abnormal; Notable for the following:    Bacteria, UA FEW (*)  All other components within normal limits  CBC WITH DIFFERENTIAL - Abnormal; Notable for the following:    RBC 3.74 (*)    Hemoglobin 12.2 (*)    HCT 36.4 (*)    Eosinophils Relative 8 (*)    All other components within normal limits  CULTURE, BLOOD (ROUTINE X 2)  CULTURE, BLOOD (ROUTINE X 2)  URINE CULTURE  URINE CULTURE  CULTURE, EXPECTORATED SPUTUM-ASSESSMENT  GRAM STAIN  TROPONIN I  PRO B NATRIURETIC PEPTIDE  LACTIC ACID, PLASMA  LEGIONELLA ANTIGEN, URINE  STREP PNEUMONIAE URINARY ANTIGEN    Imaging Review No results found.   EKG Interpretation   Date/Time:  Wednesday Dec 20 2013 10:07:00 EDT Ventricular Rate:  92 PR Interval:  211 QRS Duration: 156 QT Interval:  420 QTC Calculation: 520 R Axis:   -71 Text Interpretation:  Sinus rhythm  prolonged PR interval RBBB  Left  anterior fasicular block   Left ventricular hypertrophy Non-specific ST-t  changes Reconfirmed by Wilson Singer  MD, Yuleni Burich ([id number]) on 12/20/2013 10:14:22 AM      MDM   Final diagnoses:  HCAP (healthcare-associated pneumonia)  UTI (urinary tract infection)    80 year old male with a generalized fatigue. Appears to have a urinary tract infection & pneumonia has not completely resolved. Given hypoxia, and age will admit for further treatment.   Virgel Manifold, MD 12/25/13 2232

## 2013-12-26 LAB — CULTURE, BLOOD (ROUTINE X 2)
Culture: NO GROWTH
Culture: NO GROWTH

## 2014-01-23 ENCOUNTER — Ambulatory Visit (INDEPENDENT_AMBULATORY_CARE_PROVIDER_SITE_OTHER): Payer: Medicare Other

## 2014-01-23 ENCOUNTER — Ambulatory Visit (INDEPENDENT_AMBULATORY_CARE_PROVIDER_SITE_OTHER): Payer: Medicare Other | Admitting: Internal Medicine

## 2014-01-23 VITALS — BP 118/68 | HR 61 | Temp 98.8°F | Resp 16 | Ht 70.0 in | Wt 206.4 lb

## 2014-01-23 DIAGNOSIS — IMO0001 Reserved for inherently not codable concepts without codable children: Secondary | ICD-10-CM | POA: Diagnosis not present

## 2014-01-23 DIAGNOSIS — E1129 Type 2 diabetes mellitus with other diabetic kidney complication: Secondary | ICD-10-CM | POA: Diagnosis not present

## 2014-01-23 DIAGNOSIS — E1165 Type 2 diabetes mellitus with hyperglycemia: Secondary | ICD-10-CM

## 2014-01-23 DIAGNOSIS — E785 Hyperlipidemia, unspecified: Secondary | ICD-10-CM

## 2014-01-23 DIAGNOSIS — J189 Pneumonia, unspecified organism: Secondary | ICD-10-CM

## 2014-01-23 DIAGNOSIS — E039 Hypothyroidism, unspecified: Secondary | ICD-10-CM | POA: Diagnosis not present

## 2014-01-23 LAB — GLUCOSE, POCT (MANUAL RESULT ENTRY): POC Glucose: 94 mg/dl (ref 70–99)

## 2014-01-23 NOTE — Progress Notes (Addendum)
This chart was scribed for Tami Lin, MD by Eston Mould, ED Scribe. This patient was seen in room Room/bed 12 and the patient's care was started at 5:47 PM. Subjective:    Patient ID: Travis Bradford, male    DOB: 30-Sep-1925, 80 y.o.   MRN: [id number] Chief Complaint  Patient presents with   Follow-up    Dr Laney [name] and blood work   HPI Travis Bradford is a 80 y.o. male with hx of hypotension, severe pneumonia 40 years ago, CAP, DM, HL, septic shock and bradycardia who presents to the [facility] for F/U apt. Pt was diagnosed with HCAP recently and admitted to the hospital May 2015; was given medications and discharge. He was not given a strong enough dosage and was re-admitted to the hospital for a week. Reports having Pneumonia vaccine April 2015. Pt was borderline DM while in the hospital; A1C was done and pt was started on Metformin. Son suspects pt was had CAP due to aspiration or GERD. Pt does not have a PCP but generally comes to Dr. Laney [name] and [facility] hospital. Pt was recently started on Centroid x 6 weeks and still taking Metformin. Son states he has been c/o restless legs; hx of restless leg syndrome. Son is requesting pt to have labs. Pt is feeling well, has lost some weight and has a normal appetite but still isn't "peek to peek". Pt is still working out, swimming, and lifting weights about 3-4 days a week. 20 years ago, Dr. [name] did a cardiac work-up on pt; pt had high cholesterol and was placed on medications.   Patient Active Problem List   Diagnosis Date Noted   HCAP (healthcare-associated pneumonia) 12/20/2013   UTI (lower urinary tract infection) 12/20/2013   Nonspecific abnormal electrocardiogram (ECG) (EKG) 12/10/2013   Leukocytosis 12/09/2013   Sinus tachycardia 12/09/2013   Respiratory distress 12/09/2013   Hypotension 12/09/2013   Septic shock 12/09/2013   Unspecified hypothyroidism 12/09/2013   Restless leg syndrome 12/09/2013   Arthritis      CAP (community acquired pneumonia) 12/08/2013   Diabetes mellitus with renal manifestation 12/14/2012   Anemia 12/14/2012   Nonspecific abnormal unspecified cardiovascular function study 10/18/2012   Bradycardia 07/13/2012   Hyperlipidemia 07/13/2012   Current outpatient prescriptions:aspirin 81 MG tablet, Take 81 mg by mouth daily., Disp: , Rfl: ;  ergocalciferol (VITAMIN D2) 50000 UNITS capsule, Take 50,000 Units by mouth once a week., Disp: , Rfl: ;  fish oil-omega-3 fatty acids 1000 MG capsule, Take 2 g by mouth daily., Disp: , Rfl: ;  glucosamine-chondroitin 500-400 MG tablet, Take 1 tablet by mouth daily., Disp: , Rfl:  levothyroxine (SYNTHROID, LEVOTHROID) 100 MCG tablet, Take 100 mcg by mouth daily., Disp: , Rfl: ;  metFORMIN (GLUCOPHAGE) 500 MG tablet, Take 1 tablet (500 mg total) by mouth 2 (two) times daily with a meal., Disp: 60 tablet, Rfl: 1;  Methylsulfonylmethane (MSM) 500 MG CAPS, Take 1 capsule by mouth daily., Disp: , Rfl: ;  Multiple Vitamin (MULTIVITAMIN WITH MINERALS) TABS tablet, Take 1 tablet by mouth daily., Disp: , Rfl:  rOPINIRole (REQUIP) 3 MG tablet, Take 5 mg by mouth at bedtime. , Disp: , Rfl: ;  simvastatin (ZOCOR) 40 MG tablet, Take 40 mg by mouth daily before breakfast. , Disp: , Rfl:   Review of Systems  Constitutional: Negative for appetite change.   Objective:   Physical Exam  Nursing note and vitals reviewed. Constitutional: He is oriented to person, place, and time. He appears well-developed and well-nourished. No  distress.  HENT:  Head: Normocephalic.  Eyes: Conjunctivae and EOM are normal. Pupils are equal, round, and reactive to light.  Neck: Neck supple. No thyromegaly present.  Cardiovascular: Normal rate, regular rhythm, normal heart sounds and intact distal pulses.   No murmur heard. Pulmonary/Chest: Effort normal and breath sounds normal. No respiratory distress. He has no wheezes. He exhibits no tenderness.  Musculoskeletal: He  exhibits no edema.  Lymphadenopathy:    He has no cervical adenopathy.  Neurological: He is alert and oriented to person, place, and time. He has normal reflexes. No cranial nerve deficit.  Psychiatric: He has a normal mood and affect. His behavior is normal. Thought content normal.   Triage Vitals:BP 118/68   Pulse 61   Temp(Src) 98.8 F (37.1 C) (Oral)   Resp 16   Ht 5\' 10"  (1.778 m)   Wt 206 lb 6.4 oz (93.622 kg)   BMI 29.62 kg/m2   SpO2 97%  Results for orders placed in visit on 01/23/14  GLUCOSE, POCT (MANUAL RESULT ENTRY)      Result Value Ref Range   POC Glucose 94  70 - 99 mg/dl   [facility] preliminary x-ray report read by Dr. Tami Lin:  Clearing compared to Rays from hospital except the linear densities from the r hilum to RLL-?old scaring  Assessment & Plan:  HCAP (healthcare-associated pneumonia) - Plan: DG Chest 2 View  Unspecified hypothyroidism - Plan: TSH, T4, free  Type II or unspecified type diabetes mellitus without mention of complication, uncontrolled - Plan: POCT glucose (manual entry)  Type 2 diabetes mellitus with other diabetic kidney complication  Hyperlipidemia  meds at [facility]   I have completed the patient encounter in its entirety as documented by the scribe, with editing by me where necessary. Robert P. Laney [name], M.D.

## 2014-01-24 LAB — TSH: TSH: 4.299 u[IU]/mL (ref 0.350–4.500)

## 2014-01-24 LAB — T4, FREE: Free T4: 1.18 ng/dL (ref 0.80–1.80)

## 2014-02-23 ENCOUNTER — Ambulatory Visit (INDEPENDENT_AMBULATORY_CARE_PROVIDER_SITE_OTHER): Payer: Medicare Other | Admitting: Internal Medicine

## 2014-02-23 VITALS — BP 120/70 | HR 49 | Temp 97.3°F | Resp 16 | Ht 71.0 in | Wt 204.6 lb

## 2014-02-23 DIAGNOSIS — E1129 Type 2 diabetes mellitus with other diabetic kidney complication: Secondary | ICD-10-CM | POA: Diagnosis not present

## 2014-02-23 LAB — POCT GLYCOSYLATED HEMOGLOBIN (HGB A1C): Hemoglobin A1C: 5.6

## 2014-02-23 NOTE — Progress Notes (Signed)
He wonders if he needs to be on any medicines for diabetes His diagnosis was made during hospitalization for an acute respiratory infection At his visit 6 weeks ago or 8 weeks ago his metformin was reduced to one tablet a day At today's visit he would like testing to see what his diagnosis actually is His microalbumin was slightly elevated  Patient Active Problem List   Diagnosis Date Noted  . HCAP (healthcare-associated pneumonia) 12/20/2013  . UTI (lower urinary tract infection) 12/20/2013  . Nonspecific abnormal electrocardiogram (ECG) (EKG) 12/10/2013  . Leukocytosis 12/09/2013  . Sinus tachycardia 12/09/2013  . Respiratory distress 12/09/2013  . Hypotension 12/09/2013  . Septic shock 12/09/2013  . Unspecified hypothyroidism 12/09/2013  . Restless leg syndrome 12/09/2013  . Arthritis   . CAP (community acquired pneumonia) 12/08/2013  . Diabetes mellitus with renal manifestation 12/14/2012  . Anemia 12/14/2012  . Nonspecific abnormal unspecified cardiovascular function study 10/18/2012  . Bradycardia 07/13/2012  . Hyperlipidemia 07/13/2012   Prior to Admission medications   Medication Sig Start Date End Date Taking? Authorizing Provider  aspirin 81 MG tablet Take 81 mg by mouth daily.   Yes Historical Provider, MD  ergocalciferol (VITAMIN D2) 50000 UNITS capsule Take 50,000 Units by mouth once a week.   Yes Historical Provider, MD  fish oil-omega-3 fatty acids 1000 MG capsule Take 2 g by mouth daily.   Yes Historical Provider, MD  glucosamine-chondroitin 500-400 MG tablet Take 1 tablet by mouth daily.   Yes Historical Provider, MD  levothyroxine (SYNTHROID, LEVOTHROID) 100 MCG tablet Take 100 mcg by mouth daily.   Yes Historical Provider, MD  metFORMIN (GLUCOPHAGE) 500 MG tablet Take 1 tablet (500 mg total) by mouth 2 (two) times daily with a meal. 12/11/13  Yes Annita Brod, MD  Methylsulfonylmethane (MSM) 500 MG CAPS Take 1 capsule by mouth daily.   Yes Historical Provider,  MD  Multiple Vitamin (MULTIVITAMIN WITH MINERALS) TABS tablet Take 1 tablet by mouth daily.   Yes Historical Provider, MD  rOPINIRole (REQUIP) 3 MG tablet Take 5 mg by mouth at bedtime.    Yes Historical Provider, MD  simvastatin (ZOCOR) 40 MG tablet Take 40 mg by mouth daily before breakfast.    Yes Historical Provider, MD   Exam BP 120/70  Pulse 49  Temp(Src) 97.3 F (36.3 C) (Oral)  Resp 16  Ht 5\' 11"  (1.803 m)  Wt 204 lb 9.6 oz (92.806 kg)  BMI 28.55 kg/m2  SpO2 97% Physical exam unchanged  Type 2 diabetes mellitus with other diabetic kidney complication - Plan: POCT glycosylated hemoglobin (Hb A1C)  Hemoglobin A1c 5.6 History symbol to assume that he doesn't have diabetes at this point and stop his medicine with a followup A1c in 3-4 months  He has an excellent an excellent exercise plan although he is disappointed in his increasing fatigue as he ages We may refer him to a geriatrician if this does not improve within a consistent increase slowly in his level of total exercise

## 2014-04-19 DIAGNOSIS — L988 Other specified disorders of the skin and subcutaneous tissue: Secondary | ICD-10-CM | POA: Diagnosis not present

## 2014-04-19 DIAGNOSIS — L821 Other seborrheic keratosis: Secondary | ICD-10-CM | POA: Diagnosis not present

## 2014-04-19 DIAGNOSIS — Z85828 Personal history of other malignant neoplasm of skin: Secondary | ICD-10-CM | POA: Diagnosis not present

## 2014-04-19 DIAGNOSIS — L280 Lichen simplex chronicus: Secondary | ICD-10-CM | POA: Diagnosis not present

## 2014-04-19 DIAGNOSIS — L819 Disorder of pigmentation, unspecified: Secondary | ICD-10-CM | POA: Diagnosis not present

## 2014-05-06 DIAGNOSIS — Z23 Encounter for immunization: Secondary | ICD-10-CM | POA: Diagnosis not present

## 2014-08-28 ENCOUNTER — Ambulatory Visit (INDEPENDENT_AMBULATORY_CARE_PROVIDER_SITE_OTHER): Payer: Medicare Other

## 2014-08-28 ENCOUNTER — Ambulatory Visit (INDEPENDENT_AMBULATORY_CARE_PROVIDER_SITE_OTHER): Payer: Medicare Other | Admitting: Internal Medicine

## 2014-08-28 VITALS — BP 136/70 | HR 71 | Temp 97.8°F | Resp 16 | Ht 70.0 in | Wt 207.0 lb

## 2014-08-28 DIAGNOSIS — R05 Cough: Secondary | ICD-10-CM

## 2014-08-28 DIAGNOSIS — R059 Cough, unspecified: Secondary | ICD-10-CM

## 2014-08-28 MED ORDER — BENZONATATE 100 MG PO CAPS: 100.0000 mg | ORAL_CAPSULE | Freq: Two times a day (BID) | ORAL | Status: DC | PRN

## 2014-08-28 MED ORDER — FLUTICASONE PROPIONATE 50 MCG/ACT NA SUSP: 2.0000 | Freq: Every day | NASAL | Status: DC

## 2014-08-28 NOTE — Progress Notes (Addendum)
Subjective:  This chart was scribed for Travis Lin, MD by Molli Posey, Medical scribe. This patient was seen in ROOM 4 and the patient's care was started 5:33 PM.   Patient ID: Travis Bradford, male    DOB: February 02, 1926, 81 y.o.   MRN: [id number]   Chief Complaint  Patient presents with  . Cough    4 or 5 months, starts in the evening   HPI HPI Comments: Travis Bradford is a 81 y.o. male with a history of chronic illnesses as noted below, who presents to [facility] complaining of a persistent cough for the last 4 to 5 months. Pt states that his cough worsens in the evening and is gone when he wakes up. He reports occasional indigestion that he typically treats with antacids tablets maybe about twice a week. He does not have daily reflux. Pt says he went to an allergist years ago and was told he was allergic to cat dander and mold, but he denies daily sneezing or itching of the eyes or postnasal drip.[name] He denies SOB, fever, sneezing, weight loss and any trouble breathing through his nose. Pt reports no alleviating or modifying factors at this time. The cough is not productive. No history of asthma. He has not been on an ACE inhibitor even though he has diabetes.  Patient Active Problem List   Diagnosis Date Noted  . HCAP (healthcare-associated pneumonia) 12/20/2013  . UTI (lower urinary tract infection) 12/20/2013  . Nonspecific abnormal electrocardiogram (ECG) (EKG) 12/10/2013  . Leukocytosis 12/09/2013  . Sinus tachycardia 12/09/2013  . Respiratory distress 12/09/2013  . Hypotension 12/09/2013  . Septic shock 12/09/2013  . Unspecified hypothyroidism 12/09/2013  . Restless leg syndrome 12/09/2013  . Arthritis   . CAP (community acquired pneumonia) 12/08/2013  . Diabetes mellitus with renal manifestation 12/14/2012  . Anemia 12/14/2012  . Nonspecific abnormal unspecified cardiovascular function study 10/18/2012  . Bradycardia 07/13/2012  . Hyperlipidemia 07/13/2012   Past Medical  History  Diagnosis Date  . Hyperlipidemia   . BPH (benign prostatic hyperplasia)   . Lymphoma     15 years ago  . Arthritis   . History of blood transfusion   . Pneumonia   . Prostate cancer   . Hypothyroid    No Known Allergies   Current Outpatient Prescriptions on File Prior to Visit  Medication Sig Dispense Refill  . aspirin 81 MG tablet Take 81 mg by mouth daily.    . ergocalciferol (VITAMIN D2) 50000 UNITS capsule Take 50,000 Units by mouth once a week.    . fish oil-omega-3 fatty acids 1000 MG capsule Take 2 g by mouth daily.    [name] glucosamine-chondroitin 500-400 MG tablet Take 1 tablet by mouth daily.    [name] levothyroxine (SYNTHROID, LEVOTHROID) 100 MCG tablet Take 100 mcg by mouth daily.    . Methylsulfonylmethane (MSM) 500 MG CAPS Take 1 capsule by mouth daily.    [name] rOPINIRole (REQUIP) 3 MG tablet Take 5 mg by mouth at bedtime.     . metFORMIN (GLUCOPHAGE) 500 MG tablet Take 1 tablet (500 mg total) by mouth 2 (two) times daily with a meal. (Patient not taking: Reported on 08/28/2014) 60 tablet 1  . Multiple Vitamin (MULTIVITAMIN WITH MINERALS) TABS tablet Take 1 tablet by mouth daily.    . simvastatin (ZOCOR) 40 MG tablet Take 40 mg by mouth daily before breakfast.      No current facility-administered medications on file prior to visit.   Past Surgical History  Procedure  Laterality Date  . Appendectomy    . Athroscopic knee surgery    . Mohs surgery      x 6  . Dental implants    . Green light laser turp (transurethral resection of prostate  08/29/2012    Procedure: GREEN LIGHT LASER TURP (TRANSURETHRAL RESECTION OF PROSTATE;  Surgeon: [name], MD;  Location: [facility];  Service: Urology;  Laterality: N/A;   Family History  Problem Relation Age of Onset  . CAD Mother   . Stroke Brother      X 2    Review of Systems  Constitutional: Negative for fever and unexpected weight change.  Respiratory: Positive for cough. Negative for shortness of breath.    no  chills or night sweats No TB exposure Nonsmoker Negative chest x-ray 6 months ago No history of congestive failure     Objective:   Physical Exam  Constitutional: He is oriented to person, place, and time. He appears well-developed and well-nourished. No distress.  HENT:  Head: Normocephalic and atraumatic.  Nose is clear.   Eyes: Conjunctivae and EOM are normal. Pupils are equal, round, and reactive to light.  Neck: Neck supple. No thyromegaly present.  Cardiovascular: Normal rate, regular rhythm and normal heart sounds.   No murmur heard. Pulmonary/Chest: Effort normal.  Crackles at both bases. No wheezing with forced expiration.   Lymphadenopathy:    He has no cervical adenopathy.  Neurological: He is alert and oriented to person, place, and time. No cranial nerve deficit.  Psychiatric: He has a normal mood and affect. His behavior is normal.  Nursing note and vitals reviewed. BP 136/70 mmHg  Pulse 71  Temp(Src) 97.8 F (36.6 C) (Oral)  Resp 16  Ht 5\' 10"  (1.778 m)  Wt 207 lb (93.895 kg)  BMI 29.70 kg/m2  SpO2 97%  [facility] reading (PRIMARY) by  Dr. Kanton Kamel=No chg from last xray--no mass lesions      Assessment & Plan:  I have completed the patient encounter in its entirety as documented by the scribe, with editing by me where necessary. [name] P. Laney [name], M.D.   Persistent cough of uncertain etiology Meds ordered this encounter  Medications  . benzonatate (TESSALON) 100 MG capsule    Sig: Take 1 capsule (100 mg total) by mouth 2 (two) times daily as needed for cough.    Dispense:  60 capsule    Refill:  5  . fluticasone (FLONASE) 50 MCG/ACT nasal spray    Sig: Place 2 sprays into both nostrils daily.    Dispense:  16 g    Refill:  10   increase humidity especially in the bedroom Watch carefully for any reflux symptoms Recheck in one month with primary care provider

## 2014-08-29 ENCOUNTER — Encounter: Payer: Self-pay | Admitting: Internal Medicine

## 2014-10-18 DIAGNOSIS — D1801 Hemangioma of skin and subcutaneous tissue: Secondary | ICD-10-CM | POA: Diagnosis not present

## 2014-10-18 DIAGNOSIS — D225 Melanocytic nevi of trunk: Secondary | ICD-10-CM | POA: Diagnosis not present

## 2014-10-18 DIAGNOSIS — L570 Actinic keratosis: Secondary | ICD-10-CM | POA: Diagnosis not present

## 2014-10-18 DIAGNOSIS — B070 Plantar wart: Secondary | ICD-10-CM | POA: Diagnosis not present

## 2014-10-18 DIAGNOSIS — Z85828 Personal history of other malignant neoplasm of skin: Secondary | ICD-10-CM | POA: Diagnosis not present

## 2014-10-18 DIAGNOSIS — C44319 Basal cell carcinoma of skin of other parts of face: Secondary | ICD-10-CM | POA: Diagnosis not present

## 2014-10-18 DIAGNOSIS — L814 Other melanin hyperpigmentation: Secondary | ICD-10-CM | POA: Diagnosis not present

## 2014-11-07 DIAGNOSIS — C44319 Basal cell carcinoma of skin of other parts of face: Secondary | ICD-10-CM | POA: Diagnosis not present

## 2014-11-07 DIAGNOSIS — Z85828 Personal history of other malignant neoplasm of skin: Secondary | ICD-10-CM | POA: Diagnosis not present

## 2014-12-31 ENCOUNTER — Ambulatory Visit (INDEPENDENT_AMBULATORY_CARE_PROVIDER_SITE_OTHER): Payer: Medicare Other | Admitting: Family Medicine

## 2014-12-31 VITALS — BP 112/60 | HR 51 | Temp 98.5°F | Resp 18 | Ht 71.0 in | Wt 212.4 lb

## 2014-12-31 DIAGNOSIS — R5383 Other fatigue: Secondary | ICD-10-CM

## 2014-12-31 NOTE — Progress Notes (Signed)
Subjective:    Patient ID: Travis Bradford, male    DOB: 11/05/1925, 81 y.o.   MRN: [id number] This chart was scribed for Travis Knapp, MD by Martinique Peace, ED Scribe. The patient was seen in RM02. The patient's care was started at [place] PM.  Chief Complaint  Patient presents with  . Injections    B12     HPI B12 last checked 2 years ago and was high with reading of 1135. Last seen here at [facility] 4 months ago. Benign nocturnal cough at that time. No related problems to B12. Pt is also followed at [facility] hospital, as well here by Dr. Laney [name].   HPI Comments: Travis Bradford is a 81 y.o. male who presents to the [facility] seeking B12 injection which he explains is because the B12 pills he has currently been taking, are no longer having any effect on him. He complains that he is always feeling fatigued throughout the day even after getting 8 hrs or 9 hrs of sleep the night before. He explains that he wants to figure out what is going on. He notes he usually wakes up about 2x during the night to use the bathroom but otherwise, sleeps very well at night. Pt expresses that he feels his constant fatigue is related to his B12 levels. Pt adds that Dr. Laney [name] previously advised him that he should start exercising more, which he reports he now works out 5x a week. History of prostate surgery.     Past Medical History  Diagnosis Date  . Hyperlipidemia   . BPH (benign prostatic hyperplasia)   . Lymphoma     15 years ago  . Arthritis   . History of blood transfusion   . Pneumonia   . Prostate cancer   . Hypothyroid    Past Surgical History  Procedure Laterality Date  . Appendectomy    . Athroscopic knee surgery    . Mohs surgery      x 6  . Dental implants    . Green light laser turp (transurethral resection of prostate  08/29/2012    Procedure: GREEN LIGHT LASER TURP (TRANSURETHRAL RESECTION OF PROSTATE;  Surgeon: [name], MD;  Location: [facility];  Service: Urology;  Laterality: N/A;   No  Known Allergies  Current Outpatient Prescriptions on File Prior to Visit  Medication Sig Dispense Refill  . aspirin 81 MG tablet Take 81 mg by mouth daily.    . benzonatate (TESSALON) 100 MG capsule Take 1 capsule (100 mg total) by mouth 2 (two) times daily as needed for cough. 60 capsule 5  . ergocalciferol (VITAMIN D2) 50000 UNITS capsule Take 50,000 Units by mouth once a week.    . fish oil-omega-3 fatty acids 1000 MG capsule Take 2 g by mouth daily.    . fluticasone (FLONASE) 50 MCG/ACT nasal spray Place 2 sprays into both nostrils daily. 16 g 10  . glucosamine-chondroitin 500-400 MG tablet Take 1 tablet by mouth daily.    [name] levothyroxine (SYNTHROID, LEVOTHROID) 100 MCG tablet Take 100 mcg by mouth daily.    . metFORMIN (GLUCOPHAGE) 500 MG tablet Take 1 tablet (500 mg total) by mouth 2 (two) times daily with a meal. 60 tablet 1  . Methylsulfonylmethane (MSM) 500 MG CAPS Take 1 capsule by mouth daily.    . Multiple Vitamin (MULTIVITAMIN WITH MINERALS) TABS tablet Take 1 tablet by mouth daily.    [name] rOPINIRole (REQUIP) 3 MG tablet Take 5 mg by mouth at bedtime.     [name]  simvastatin (ZOCOR) 40 MG tablet Take 40 mg by mouth daily before breakfast.      No current facility-administered medications on file prior to visit.     Review of Systems  Constitutional: Positive for fatigue.  Psychiatric/Behavioral: Negative for sleep disturbance.       Objective:   Physical Exam  Constitutional: He is oriented to person, place, and time. He appears well-developed and well-nourished. No distress.  HENT:  Head: Normocephalic and atraumatic.  Eyes: Conjunctivae and EOM are normal.  Neck: Neck supple. No tracheal deviation present.  Cardiovascular: Normal rate.   Pulmonary/Chest: Effort normal. No respiratory distress.  Musculoskeletal: Normal range of motion.  Neurological: He is alert and oriented to person, place, and time.  Skin: Skin is warm and dry.  Psychiatric: He has a normal mood and  affect. His behavior is normal.  Nursing note and vitals reviewed.    Filed Vitals:   12/31/14 1552  BP: 112/60  Pulse: 51  Temp: 98.5 F (36.9 C)  Resp: 76    5:11 PM- Treatment plan was discussed with patient who verbalizes understanding and agrees.        Assessment & Plan:  Fatigue - pt advised that unlikely to be due to B12 deficiency as he is taking po and had level checked 2 yrs ago when it was mildly elevated at 1135 (high 900).  Trial of IM b12 unlikely to have any sig side effects but highly suspect that it will not help his sxs as well as his insurance may not pay for injection since no b12deficiency.  Pt reports he had full lab eval at the [place] [facility] mos prior and all was normal but he is not sure if his b12 level was checked so pt would have to sign ABN and will have to pay out of pocket as only covered 1x/yr.  Rec pt have fatigue w/u w/ tsh, B12, cbc, etc but he declines for now.  Advised pt that having fatigue upon awakening even after a 8-9 hr sleep is concerning for poor sleep quality but pt denies any h/o apnea sxs and thinks very unlikely and declines referral for PNSG.  Pt will f/u w/ his PCP for this.  I personally performed the services described in this documentation, which was scribed in my presence. The recorded information has been reviewed and considered, and addended by me as needed.  Delman Cheadle, MD MPH

## 2015-04-18 DIAGNOSIS — L570 Actinic keratosis: Secondary | ICD-10-CM | POA: Diagnosis not present

## 2015-04-18 DIAGNOSIS — D225 Melanocytic nevi of trunk: Secondary | ICD-10-CM | POA: Diagnosis not present

## 2015-04-18 DIAGNOSIS — L821 Other seborrheic keratosis: Secondary | ICD-10-CM | POA: Diagnosis not present

## 2015-04-18 DIAGNOSIS — C44319 Basal cell carcinoma of skin of other parts of face: Secondary | ICD-10-CM | POA: Diagnosis not present

## 2015-04-18 DIAGNOSIS — Z85828 Personal history of other malignant neoplasm of skin: Secondary | ICD-10-CM | POA: Diagnosis not present

## 2015-07-23 DIAGNOSIS — Z85828 Personal history of other malignant neoplasm of skin: Secondary | ICD-10-CM | POA: Diagnosis not present

## 2015-07-23 DIAGNOSIS — L821 Other seborrheic keratosis: Secondary | ICD-10-CM | POA: Diagnosis not present

## 2015-07-23 DIAGNOSIS — L570 Actinic keratosis: Secondary | ICD-10-CM | POA: Diagnosis not present

## 2015-08-06 ENCOUNTER — Ambulatory Visit (INDEPENDENT_AMBULATORY_CARE_PROVIDER_SITE_OTHER): Payer: Medicare Other | Admitting: Internal Medicine

## 2015-08-06 VITALS — BP 108/72 | HR 58 | Temp 98.3°F | Resp 16 | Ht 70.5 in | Wt 219.2 lb

## 2015-08-06 DIAGNOSIS — R590 Localized enlarged lymph nodes: Secondary | ICD-10-CM

## 2015-08-06 DIAGNOSIS — R599 Enlarged lymph nodes, unspecified: Secondary | ICD-10-CM | POA: Diagnosis not present

## 2015-08-06 DIAGNOSIS — R221 Localized swelling, mass and lump, neck: Secondary | ICD-10-CM | POA: Diagnosis not present

## 2015-08-06 DIAGNOSIS — Z859 Personal history of malignant neoplasm, unspecified: Secondary | ICD-10-CM

## 2015-08-06 DIAGNOSIS — Z0389 Encounter for observation for other suspected diseases and conditions ruled out: Secondary | ICD-10-CM

## 2015-08-06 DIAGNOSIS — Z8572 Personal history of non-Hodgkin lymphomas: Secondary | ICD-10-CM

## 2015-08-06 NOTE — Progress Notes (Signed)
Subjective:  By signing my name below, I, Travis Bradford, attest that this documentation has been prepared under the direction and in the presence of Tami Lin, MD.  Travis Bradford, Medical Scribe. 08/06/2015.  12:28 PM.   Patient ID: Travis Bradford, male    DOB: 1925-09-29, 82 y.o.   MRN: [id number]  Chief Complaint  Patient presents with  . Other    swollen lymph nodes, in his neck    HPI HPI Comments: Travis Bradford is a 82 y.o. male with a history of skin cancer, and lymphoma in the inguinal area (20 years ago) who presents to [facility] complaining of a recent onset of swollen lymph nodes in the neck.  Pt is concerned that his current symptoms could be a reoccurrence of that. He denies recent cuts in the affected area. Pt reports that he has recently gotten a cat, that is playful, and might have bit him or scratch him. Pt denies night sweats, fever, weight loss, fatigue different than baseline, SOB, cough.    Patient Active Problem List--partial   Diagnosis Date Noted  . Unspecified hypothyroidism 12/09/2013  . Restless leg syndrome 12/09/2013  . Arthritis   . Diabetes mellitus with renal manifestation ([place]) 12/14/2012  . Anemia 12/14/2012  . Hyperlipidemia 07/13/2012   Past Medical History--pertinent  Diagnosis Date  . BPH (benign prostatic hyperplasia)   . Lymphoma (HCC)--inguinal presentation resp to [facility] and radiation     15 years ago  . Prostate cancer [facility])    Past Surgical History  Procedure Laterality Date  . Appendectomy    . Athroscopic knee surgery    . Mohs surgery      x 6  . Dental implants    . Green light laser turp (transurethral resection of prostate  08/29/2012    Procedure: GREEN LIGHT LASER TURP (TRANSURETHRAL RESECTION OF PROSTATE;  Surgeon: [name], MD;  Location: [facility];  Service: Urology;  Laterality: N/A;   No Known Allergies Prior to Admission medications   Medication Sig Start Date End Date  Taking? Authorizing Provider  aspirin 81 MG tablet Take 81 mg by mouth daily.   Yes Historical Provider, MD  ergocalciferol (VITAMIN D2) 50000 UNITS capsule Take 50,000 Units by mouth once a week.   Yes Historical Provider, MD  fish oil-omega-3 fatty acids 1000 MG capsule Take 2 g by mouth daily.   Yes Historical Provider, MD  glucosamine-chondroitin 500-400 MG tablet Take 1 tablet by mouth daily.   Yes Historical Provider, MD  levothyroxine (SYNTHROID, LEVOTHROID) 100 MCG tablet Take 100 mcg by mouth daily.   Yes Historical Provider, MD  Methylsulfonylmethane (MSM) 500 MG CAPS Take 1 capsule by mouth daily.   Yes Historical Provider, MD  rOPINIRole (REQUIP) 3 MG tablet Take 5 mg by mouth at bedtime.    Yes Historical Provider, MD  benzonatate (TESSALON) 100 MG capsule Take 1 capsule (100 mg total) by mouth 2 (two) times daily as needed for cough. Patient not taking: Reported on 08/06/2015 08/28/14   Leandrew Koyanagi, MD  fluticasone [facility]) 50 MCG/ACT nasal spray Place 2 sprays into both nostrils daily. Patient not taking: Reported on 08/06/2015 08/28/14   Leandrew Koyanagi, MD  metFORMIN (GLUCOPHAGE) 500 MG tablet Take 1 tablet (500 mg total) by mouth 2 (two) times daily with a meal. Patient not taking: Reported on 08/06/2015 12/11/13   Annita Brod, MD  Multiple Vitamin (MULTIVITAMIN WITH MINERALS) TABS tablet Take 1 tablet  by mouth daily. Reported on 08/06/2015    Historical Provider, MD  simvastatin (ZOCOR) 40 MG tablet Take 40 mg by mouth daily before breakfast. Reported on 08/06/2015    Historical Provider, MD   Social History   Social History  . Marital Status: Widowed    Spouse Name: N/A  . Number of Children: 2  . Years of Education: N/A   Occupational History  . Not on file.   Social History Main Topics  . Smoking status: Former Research scientist (life sciences)  . Smokeless tobacco: Never Used  . Alcohol Use: Yes     Comment: Rarely  . Drug Use: Not on file  . Sexual Activity: Not on file    Other Topics Concern  . Not on file   Social History Narrative    Review of Systems  Constitutional: Negative for fever, diaphoresis, fatigue and unexpected weight change.  Respiratory: Negative for cough and shortness of breath.   Musculoskeletal: Positive for joint swelling.      Objective:   Physical Exam  Constitutional: He is oriented to person, place, and time. He appears well-developed and well-nourished. No distress.  HENT:  Head: Normocephalic and atraumatic.  Eyes: EOM are normal. Pupils are equal, round, and reactive to light.  Neck: Neck supple.  There are 4 very firm lymph nodes in the left posterior cervical chain. 1, at the angle of the jaw is largest, and the other 3 are lower. These are small, firm, and relatively fixed, and non tender.  No oral pathology or regional skin lesions --there is old scar near top node. No supraclav nodes or thyromeg/nodules  Cardiovascular: Normal rate.   Pulmonary/Chest: Effort normal.  Neurological: He is alert and oriented to person, place, and time. No cranial nerve deficit.  Skin: Skin is warm and dry.  Psychiatric: He has a normal mood and affect. His behavior is normal.  Nursing note and vitals reviewed.   BP 108/72 mmHg  Pulse 58  Temp(Src) 98.3 F (36.8 C) (Oral)  Resp 16  Ht 5' 10.5" (1.791 m)  Wt 219 lb 3.2 oz (99.428 kg)  BMI 31.00 kg/m2  SpO2 98%     Assessment & Plan:  Posterior cervical adenopathy concerning for malignancy. Posterior cervical adenopathy - Plan: Ambulatory referral to ENT  Observation for suspected cancer  Hx of malignant lymphoma  Neck mass - Plan: MR Neck Soft Tissue Only W Wo Contrast    Will ref to ENT for biopsy, and try to better characterize by mri  I have completed the patient encounter in its entirety as documented by the scribe, with editing by me where necessary. Jediah Horger P. Laney [name], M.D.

## 2015-08-08 DIAGNOSIS — R590 Localized enlarged lymph nodes: Secondary | ICD-10-CM | POA: Diagnosis not present

## 2015-08-12 ENCOUNTER — Other Ambulatory Visit: Payer: Self-pay | Admitting: Otolaryngology

## 2015-08-12 DIAGNOSIS — D72822 Plasmacytosis: Secondary | ICD-10-CM | POA: Diagnosis not present

## 2015-08-12 DIAGNOSIS — R590 Localized enlarged lymph nodes: Secondary | ICD-10-CM | POA: Diagnosis not present

## 2015-08-12 DIAGNOSIS — R599 Enlarged lymph nodes, unspecified: Secondary | ICD-10-CM | POA: Diagnosis not present

## 2015-08-14 ENCOUNTER — Other Ambulatory Visit: Payer: Self-pay | Admitting: Internal Medicine

## 2015-08-14 ENCOUNTER — Other Ambulatory Visit: Payer: Self-pay | Admitting: Otolaryngology

## 2015-08-14 DIAGNOSIS — R221 Localized swelling, mass and lump, neck: Secondary | ICD-10-CM

## 2015-08-15 ENCOUNTER — Ambulatory Visit
Admission: RE | Admit: 2015-08-15 | Discharge: 2015-08-15 | Disposition: A | Discharge status: Home / Self Care | Payer: Medicare Other | Source: Ambulatory Visit | Attending: Internal Medicine | Admitting: Internal Medicine

## 2015-08-15 ENCOUNTER — Inpatient Hospital Stay: Admission: RE | Admit: 2015-08-15 | Payer: Medicare Other | Source: Ambulatory Visit

## 2015-08-15 DIAGNOSIS — R221 Localized swelling, mass and lump, neck: Secondary | ICD-10-CM | POA: Diagnosis not present

## 2015-08-15 MED ORDER — IOPAMIDOL (ISOVUE-300) INJECTION 61%
75.0000 mL | Freq: Once | INTRAVENOUS | Status: AC | PRN
  Administered 2015-08-15: 75 mL via INTRAVENOUS

## 2015-08-20 DIAGNOSIS — R590 Localized enlarged lymph nodes: Secondary | ICD-10-CM | POA: Diagnosis not present

## 2015-08-21 ENCOUNTER — Telehealth: Payer: Self-pay

## 2015-08-21 NOTE — Telephone Encounter (Signed)
Dr Laney [name] had appt opening at 3:30 pm Friday 08/23/15, contacted Dr. Pollie Friar office to let them know. Dr. Lucia Gaskins was in with a patient so they will let him know in between patients and will let me know if they are going to contact patient or if I need to.

## 2015-08-21 NOTE — Telephone Encounter (Signed)
Dr Everlene Farrier discussed case with Dr. Lucia Gaskins ENT. Biopsy did not show a malignancy. Pt currently on Biaxin and Doxy. There is a new cat in the house. He is to f/u in the next 48-72 hrs if not improving. Repeat bx may be necessary if pt does not improve.

## 2015-08-21 NOTE — Telephone Encounter (Signed)
Dr. Lucia Gaskins called and stated to cancel appt with Dr. Laney [name] on Friday. He instructed patient to come in in 48-72 hours if no improvement. appt canceled

## 2015-08-23 ENCOUNTER — Ambulatory Visit: Payer: Medicare Other | Admitting: Internal Medicine

## 2015-08-26 DIAGNOSIS — R590 Localized enlarged lymph nodes: Secondary | ICD-10-CM | POA: Diagnosis not present

## 2015-09-03 DIAGNOSIS — R590 Localized enlarged lymph nodes: Secondary | ICD-10-CM | POA: Diagnosis not present

## 2015-09-03 DIAGNOSIS — G4733 Obstructive sleep apnea (adult) (pediatric): Secondary | ICD-10-CM | POA: Diagnosis not present

## 2015-10-17 DIAGNOSIS — C44519 Basal cell carcinoma of skin of other part of trunk: Secondary | ICD-10-CM | POA: Diagnosis not present

## 2015-10-17 DIAGNOSIS — L814 Other melanin hyperpigmentation: Secondary | ICD-10-CM | POA: Diagnosis not present

## 2015-10-17 DIAGNOSIS — D225 Melanocytic nevi of trunk: Secondary | ICD-10-CM | POA: Diagnosis not present

## 2015-10-17 DIAGNOSIS — L821 Other seborrheic keratosis: Secondary | ICD-10-CM | POA: Diagnosis not present

## 2015-10-17 DIAGNOSIS — Z85828 Personal history of other malignant neoplasm of skin: Secondary | ICD-10-CM | POA: Diagnosis not present

## 2015-12-18 DIAGNOSIS — Z85828 Personal history of other malignant neoplasm of skin: Secondary | ICD-10-CM | POA: Diagnosis not present

## 2015-12-18 DIAGNOSIS — L570 Actinic keratosis: Secondary | ICD-10-CM | POA: Diagnosis not present

## 2015-12-18 DIAGNOSIS — L820 Inflamed seborrheic keratosis: Secondary | ICD-10-CM | POA: Diagnosis not present

## 2016-01-29 ENCOUNTER — Telehealth: Payer: Self-pay | Admitting: Pulmonary Disease

## 2016-01-29 NOTE — Telephone Encounter (Signed)
Spoke with pt's son and scheduled appt for 02/06/16. Nothing further needed.

## 2016-01-29 NOTE — Telephone Encounter (Signed)
What does he want pt seen for? No notes in chart as to any pulm conditions.   LMTCB

## 2016-01-29 NOTE — Telephone Encounter (Signed)
Per verbal order from SN Okay to schedule consult with SN  LVM for pt's son to return call.

## 2016-01-29 NOTE — Telephone Encounter (Signed)
Pt son returning call.Stanley A Dalton ° °

## 2016-01-29 NOTE — Telephone Encounter (Signed)
Spoke with son and he states that pt was tested for OSA several years ago and was found to have RLS. He was placed on Ropinirole. Son believes he does have OSA due to changes in his sleeping habits as he now wants to sleep all day. He is not sure if pt still even needs to be on Ropinirole. He would like pt seen for evaluation of possible OSA and RLS.  SN - Please advise as caller is requesting pt to see you. States that he has chatted with you at [employer] [facility] several times about this pt.   Thanks!

## 2016-01-29 NOTE — Telephone Encounter (Signed)
Son calling back [phone] pt has never been seen here wants to be seen as a new patient

## 2016-01-30 ENCOUNTER — Institutional Professional Consult (permissible substitution): Payer: Medicare Other | Admitting: Pulmonary Disease

## 2016-02-06 ENCOUNTER — Encounter: Payer: Self-pay | Admitting: Pulmonary Disease

## 2016-02-06 ENCOUNTER — Ambulatory Visit (INDEPENDENT_AMBULATORY_CARE_PROVIDER_SITE_OTHER)
Admission: RE | Admit: 2016-02-06 | Discharge: 2016-02-06 | Disposition: A | Discharge status: Home / Self Care | Payer: Medicare Other | Source: Ambulatory Visit | Attending: Pulmonary Disease | Admitting: Pulmonary Disease

## 2016-02-06 ENCOUNTER — Other Ambulatory Visit (INDEPENDENT_AMBULATORY_CARE_PROVIDER_SITE_OTHER): Payer: Medicare Other

## 2016-02-06 ENCOUNTER — Ambulatory Visit (INDEPENDENT_AMBULATORY_CARE_PROVIDER_SITE_OTHER): Payer: Medicare Other | Admitting: Pulmonary Disease

## 2016-02-06 VITALS — BP 110/64 | HR 101 | Temp 97.9°F | Ht 70.5 in | Wt 217.5 lb

## 2016-02-06 DIAGNOSIS — G4719 Other hypersomnia: Secondary | ICD-10-CM | POA: Diagnosis not present

## 2016-02-06 DIAGNOSIS — G4761 Periodic limb movement disorder: Secondary | ICD-10-CM | POA: Diagnosis not present

## 2016-02-06 DIAGNOSIS — R0683 Snoring: Secondary | ICD-10-CM | POA: Diagnosis not present

## 2016-02-06 DIAGNOSIS — Z8701 Personal history of pneumonia (recurrent): Secondary | ICD-10-CM | POA: Diagnosis not present

## 2016-02-06 DIAGNOSIS — R0600 Dyspnea, unspecified: Secondary | ICD-10-CM

## 2016-02-06 DIAGNOSIS — I4891 Unspecified atrial fibrillation: Secondary | ICD-10-CM | POA: Diagnosis not present

## 2016-02-06 LAB — TSH: TSH: 3.85 u[IU]/mL (ref 0.35–4.50)

## 2016-02-06 LAB — COMPREHENSIVE METABOLIC PANEL
ALT: 14 U/L (ref 0–53)
AST: 19 U/L (ref 0–37)
Albumin: 4.2 g/dL (ref 3.5–5.2)
Alkaline Phosphatase: 64 U/L (ref 39–117)
BILIRUBIN TOTAL: 0.5 mg/dL (ref 0.2–1.2)
BUN: 21 mg/dL (ref 6–23)
CO2: 30 meq/L (ref 19–32)
CREATININE: 0.93 mg/dL (ref 0.40–1.50)
Calcium: 9.9 mg/dL (ref 8.4–10.5)
Chloride: 103 mEq/L (ref 96–112)
GFR: 81.57 mL/min (ref >60.00)
GLUCOSE: 100 mg/dL — AB (ref 70–99)
Potassium: 4.3 mEq/L (ref 3.5–5.1)
Sodium: 138 mEq/L (ref 135–145)
TOTAL PROTEIN: 7.4 g/dL (ref 6.0–8.3)

## 2016-02-06 LAB — CBC WITH DIFFERENTIAL/PLATELET
BASOS PCT: 0.3 % (ref 0.0–3.0)
Basophils Absolute: 0 10*3/uL (ref 0.0–0.1)
Eosinophils Absolute: 0.4 10*3/uL (ref 0.0–0.7)
Eosinophils Relative: 3.9 % (ref 0.0–5.0)
HEMATOCRIT: 39.4 % (ref 39.0–52.0)
HEMOGLOBIN: 13.2 g/dL (ref 13.0–17.0)
LYMPHS PCT: 17.7 % (ref 12.0–46.0)
Lymphs Abs: 1.8 10*3/uL (ref 0.7–4.0)
MCHC: 33.6 g/dL (ref 30.0–36.0)
MCV: 97.2 fl (ref 78.0–100.0)
MONOS PCT: 7.2 % (ref 3.0–12.0)
Monocytes Absolute: 0.7 10*3/uL (ref 0.1–1.0)
NEUTROS ABS: 7.2 10*3/uL (ref 1.4–7.7)
Neutrophils Relative %: 70.9 % (ref 43.0–77.0)
PLATELETS: 326 10*3/uL (ref 150.0–400.0)
RBC: 4.05 Mil/uL — ABNORMAL LOW (ref 4.22–5.81)
RDW: 13.7 % (ref 11.5–15.5)
WBC: 10.2 10*3/uL (ref 4.0–10.5)

## 2016-02-06 LAB — BRAIN NATRIURETIC PEPTIDE: Pro B Natriuretic peptide (BNP): 219 pg/mL — ABNORMAL HIGH (ref 0.0–100.0)

## 2016-02-06 LAB — SEDIMENTATION RATE: Sed Rate: 32 mm/hr — ABNORMAL HIGH (ref 0–20)

## 2016-02-06 NOTE — Progress Notes (Addendum)
Subjective:     Patient ID: Travis Bradford, male   DOB: 18-Sep-1925, 82 y.o.   MRN: [id number]  HPI ~  February 06, 2016:  Initial pulmonary evaluation w/ SN>  His PCP is [facility] on [address], and he is also followed at the [place]...     82 y/o WM self referred for a pulmonary/ sleep eval due to snoring, daytime fatigue, hx PLMD, and concern for OSA;  He is here w/ his son who gives most of the Hx- he had sleep study 12/2008 in [place], [place] (report scanned into EPIC)=> NEG w/ AHI=1.6/hr, lowest O2sat=90%, signif PLMD w/ index=112.8/hr but no arousals noted (he had an arousal index of 16.8/hr recorded as "spontaneous" ?RERAs, treated for the RLS w/ Requip '3mg'$  Qhs... Pts son feels that he is diff now & recorded his Dad sleeping, played the recoding for me & it was just snoring, some moaning, no apneic periods detected;  Pt goes to bed around 10-11, wakes 1-2 to urinate & able to get back to sleep, awakes around 4-5AM & can't get back to sleep; son notes he has 2-3 cups of coffee & then naps in the morning, awakes from the nap feeling better, pt says he doesn't nap every day; not sufficienly active during the day, gets sleepy watching TV, not driving=> we will need to repeat his PSG & this is what the son wanted...   Smoking Hx>  Started smoking cigarettes in teens, up to 1/2ppd max, quit in his 24s but smokes cigars/ pipes intermittently since then  Pulmonary Hx>  He's had several bouts of pneumonia, severe bout in [place] yrs ago, recent Hosp 11/2015-NOS; he denies asthma, never told of COPD, no hx Tb or known exposures...  Medical Hx>  ?[facility];  Hx HL, DM, Hypothy, BPH, Hx non-hodgkins lymphoma, mult skin cancers...  Family Hx>  Hx + for heart dis and strokes, no hx lung dis in family...  Occup Hx>  Neg for asbestos, silica dust etc...  Current Meds>  Flonase, OTC Antihist prn, ASA81, Simva40, Synthroid100, Requip3, MVI;  He is off prev Metformin rx...     EXAM shows Afeb, VSS, O2sat=96% on RA  at rest; Wt=218#;  HEENT- neg, mallampati2;  Chest- sl decr BS at bases, scat rales, no wheezing or rhonchi;  Heart- irreg irreg Gr1/6 SEM w/o r/g;  Abd- soft, nontender, neg;  Ext- neg w/o c/c/e...   CXR 02/06/16>  Borderline heart size, Ao atherosclerosis, some scarring esp left base (no change), NAD, DDD in Tspine...  EKG 02/06/16>  AFib w/ rate ~116, LAD, min NSSTTWA...  LABS 02/06/16>  Chems- wnl,  BNP= 219;  CBC- wnl,  Sed=32...  2DEcho>  Ordered and pending...  Sleep Study>  Ordered and pending...   He will need PFTs, ambulatory oximetry, etc at a later date...  Problem List>>    Allergic Rhinitis. Hx epistaxis>  Says he was told to be allergic to cat dander & molds yrs ago by an allergist; treated w/ OTC antihist & Flonase prn; had epistaxis- seen in ER, f/u Lucia Gaskins...     Ex-smoker>  Started smoking cigarettes in teens, up to 1/2ppd max, quit in his 62s but smokes cigars/ pipes intermittently since then...    Hx recurrent pneumonias, NOS>  He reports pneumonia x 2-3 in his life; severe pneumonia in [place] yrs ago & was Hosp x 33month  [place] prob HCAP- 102Temp, chills, chest discomfort, hypoxemic- required O2, WBC=26K &  CXR w/ multilobar pneumonia; subseq CT angio was neg for PE but showed atherosclerotic Ao & coronaries, underlying emphysematous changes, patchy GGO c/w inflamm, no adenopathy, mult gallstones in GB, fatty liver, DJD in Tspine;  Cultures neg blood , no sput sent, neg viral panel;  Treated w/ Rocephin/Zithromax => Levaquin & improved...     New Onset Atrial Fib 01/2016>  Noted to have an irreg irreg pulse on today's exam, EKG w/ AFib, rate116, LAD, min NSSTTWA;  Hx prev cardiac eval by DrCrenshaw, last seen 09/2012 w/ abn stress echo (1/14) prior to TURP; it showed freq PVCs and isolated couplet, +HK in mid-distal IW suggestive of ischemia, no ST changes and no CP;  Rx w/ ASA, Statin, not cathed;  Pt did not f/u w/ Cards;  Old EKGs in 2013-14 showed  SBrady, rate ~48 1st degree AVB & LAD;  In 2015 EKGs showed an intermittent RBBB + LAD .Marland KitchenMarland Kitchen He denies CP, palpit, dizzy, syncope, edema, etc => referred to Cards for eval + Echo, etc...     Hyperlipidemia>  On Simva40 + fish Oil    DM2>  On Metformin500Bid    Hypothyroid>  On Synthroid100     BPH w/ BOO>  Seen by DrTannenbam 2013- cysto showed prostatomegaly otherw neg & treated w/ "green light" laser resection    RLS>  On Requip 85m Qhs    Remote hx non-Hodgkin's Lymphoma ~1990s> no details avail; he had inguinal adenopathy, [place], no known recurrence;  Then in 07/2015 he developed posterior left neck adenopathy- eval [facility] PCP [facility]=> NEG for malignancy w/ lymphoid hyperplasia & plasmacytosis, no granuloma, no microrganisms, flow cytometry neg w/o monoclonal B-cells or abn T-cell phenotypes, no evid of a lymphoproliferative process; the nodes resolve w/ Rx (Antibiotics and Pred)- no recurrence to date...     Hx mult skin cancers> squamous cell lesions and basal cell lesions removed by Derm PLAN>>      We will arrange for a sleep lab PSG to evaluate him for OSA, check oxygenation, & recheck his PLMD;  His more urgent need however is for Cariology follow up in light of his new onset AFib & hx of intermittent RBBB/ LAD/ 1st degree AVB..Marland KitchenMarland KitchenIn the interim we will check CXR, Labs, 2DEcho and plan Spirometry & ambulatory oximitry on ret...   ADDENDUM 02/12/16>>  2DEcho showed norm LV size & wall thickness, mod decr LVF w/ EF=40-45% & HK of the basal-midinferolateral & inferior myocardium; mild MR, mild LA(470m & RA dil, mild RV dil w/ norm RVF...     Past Medical History  Diagnosis Date  . Hyperlipidemia   . BPH (benign prostatic hyperplasia)   . [place]    [place]  . Arthritis   . History of blood transfusion   . Pneumonia   . [place]  . Hypothyroid     Past Surgical History  Procedure Laterality Date   . Appendectomy    . Athroscopic knee surgery    . Mohs surgery      x 6  . Dental implants    . Green light laser turp (transurethral resection of prostate  08/29/2012    Procedure: GREEN LIGHT LASER TURP (TRANSURETHRAL RESECTION OF PROSTATE;  Surgeon: SiAilene RudMD;  Location: [facility];  Service: Urology;  Laterality: N/A;    Outpatient Encounter Prescriptions as of 02/06/2016  Medication Sig  . aspirin 81 MG  tablet Take 81 mg by mouth daily.  . ergocalciferol (VITAMIN D2) 50000 UNITS capsule Take 50,000 Units by mouth once a week.  . fish oil-omega-3 fatty acids 1000 MG capsule Take 2 g by mouth daily.  . fluticasone (FLONASE) 50 MCG/ACT nasal spray Place 2 sprays into both nostrils daily.  [name] glucosamine-chondroitin 500-400 MG tablet Take 1 tablet by mouth daily.  [name] levothyroxine (SYNTHROID, LEVOTHROID) 100 MCG tablet Take 100 mcg by mouth daily.  . Methylsulfonylmethane (MSM) 500 MG CAPS Take 1 capsule by mouth daily.  . Multiple Vitamin (MULTIVITAMIN WITH MINERALS) TABS tablet Take 1 tablet by mouth daily. Reported on 08/06/2015  . rOPINIRole (REQUIP) 3 MG tablet Take 5 mg by mouth at bedtime.   . benzonatate (TESSALON) 100 MG capsule Take 1 capsule (100 mg total) by mouth 2 (two) times daily as needed for cough. (Patient not taking: Reported on 08/06/2015)  . metFORMIN (GLUCOPHAGE) 500 MG tablet Take 1 tablet (500 mg total) by mouth 2 (two) times daily with a meal. (Patient not taking: Reported on 08/06/2015)  . simvastatin (ZOCOR) 40 MG tablet Take 40 mg by mouth daily before breakfast. Reported on 02/06/2016   No facility-administered encounter medications on file as of 02/06/2016.    No Known Allergies   Immunization History  Administered Date(s) Administered  . Influenza,inj,Quad PF,36+ Mos 05/09/2013  He prev lived in [place], [place], [place], and his PCP here in [place] is [facility] on [address]...    Family History  Problem Relation Age of Onset  . CAD Mother   . Stroke  Brother      X 2    Social History   Social History  . Marital Status: Widowed    Spouse Name: N/A  . Number of Children: 2  . Years of Education: N/A   Occupational History  . Not on file.   Social History Main Topics  . Smoking status: Former Research scientist (life sciences)  . Smokeless tobacco: Never Used  . Alcohol Use: Yes     Comment: Rarely  . Drug Use: Not on file  . Sexual Activity: Not on file   Other Topics Concern  . Not on file   Social History Narrative    Current Medications, Allergies, Past Medical History, Past Surgical History, Family History, and Social History were reviewed in [employer] record.   Review of Systems             All symptoms NEG except where BOLDED >>  Constitutional:  F/C/S, fatigue, anorexia, unexpected weight change. HEENT:  HA, visual changes, hearing loss, earache, nasal symptoms, sore throat, mouth sores, hoarseness. Resp:  cough, sputum, hemoptysis; SOB, tightness, wheezing. Cardio:  CP, palpit, DOE, orthopnea, edema. GI:  N/V/D/C, blood in stool; reflux, abd pain, distention, gas. GU:  dysuria, freq, urgency, hematuria, flank pain, voiding difficulty. MS:  joint pain, swelling, tenderness, decr ROM; neck pain, back pain, etc. Neuro:  HA, tremors, seizures, dizziness, syncope, weakness, numbness, gait abn. Skin:  suspicious lesions or skin rash. Heme:  adenopathy, bruising, bleeding. Psyche:  confusion, agitation, sleep disturbance, hallucinations, anxiety, depression suicidal.      NOTE> long hx of fatigue complaints- pt thought it was B12 defic but labs were wnl...   Objective:   Physical Exam       Vital Signs:  Reviewed...  General:  WD, overwt, 82 y/o WM in NAD; alert & oriented; pleasant & cooperative... HEENT:  [place]/AT; Conjunctiva- pink, Sclera- nonicteric, EOM-wnl, PERRLA, EACs-clear, TMs-wnl; NOSE-clear; THROAT-clear & wnl.  Neck:  Supple w/ fair ROM; no JVD; normal carotid impulses w/o bruits; no thyromegaly or  nodules palpated; no lymphadenopathy. Chest:  Decr BS bilat, clear to P & A x few scat rales, without wheezes/ rhonchi/ or signs of consolidation... Heart:  irreg irreg rhythm; gr1/6 SEM, without rubs or gallops detected. Abdomen:  Soft & nontender- no guarding or rebound; normal bowel sounds; no organomegaly or masses palpated. Ext:  Sl decr ROM; without deformities, +arthritic changes; no varicose veins, +venous insuffic, no edema;  Pulses intact w/o bruits. Neuro:  No focal neuro deficits, gait & balance OK. Derm:  No lesions noted; no rash etc. Lymph:  No cervical, supraclavicular, axillary, or inguinal adenopathy palpated.   Assessment:      Problem List>>    Allergic Rhinitis. Hx epistaxis>  Says he was told to be allergic to cat dander & molds yrs ago by an allergist; treated w/ OTC antihist & Flonase prn; had epistaxis- seen in ER, f/u Lucia Gaskins...     Ex-smoker>  Started smoking cigarettes in teens, up to 1/2ppd max, quit in his 67s but smokes cigars/ pipes intermittently since then...    Hx recurrent pneumonias, NOS>  He reports pneumonia x 2-3 in his life; severe pneumonia in [place] yrs ago & was Hosp x 65month  [place] HCAP- 102Temp, chills, chest discomfort, hypoxemic- required O2, WBC=26K & CXR w/ multilobar pneumonia; subseq CT angio was neg for PE but showed atherosclerotic Ao & coronaries, underlying emphysematous changes, patchy GGO c/w inflamm, no adenopathy, mult gallstones in GB, fatty liver, DJD in Tspine;  Cultures neg blood , no sput sent, neg viral panel;  Treated w/ Rocephin/Zithromax => Levaquin & improved...     New Onset Atrial Fib 01/2016>  Noted to have an irreg irreg pulse on today's exam, EKG w/ AFib, rate116, LAD, min NSSTTWA;  Hx prev cardiac eval by DrCrenshaw, last seen 09/2012 w/ abn stress echo (1/14) prior to TURP; it showed freq PVCs and isolated couplet, +HK in mid-distal IW suggestive of ischemia, no ST changes and no CP;  Rx  w/ ASA, Statin, not cathed;  Pt did not f/u w/ Cards;  Old EKGs in 2013-14 showed SBrady, rate ~48 1st degree AVB & LAD;  In 2015 EKGs showed an intermittent RBBB + LAD ..Marland KitchenMarland KitchenHe denies CP, palpit, dizzy, syncope, edema, etc => referred to Cards for eval + Echo, etc...     Hyperlipidemia>  On Simva40 + fish Oil    DM2>  On Metformin500Bid    Hypothyroid>  On Synthroid100     BPH w/ BOO>  Seen by DrTannenbam 2013- cysto showed prostatomegaly otherw neg & treated w/ "green light" laser resection    RLS>  On Requip 385mQhs    Remote hx non-Hodgkin's Lymphoma ~1990s> no details avail; he had inguinal adenopathy, [place], [place];  Then in 07/2015 he developed posterior left neck adenopathy- [facility]CP [facility] Bx=> NEG for malignancy w/ lymphoid hyperplasia & plasmacytosis, no granuloma, no microrganisms, flow cytometry neg w/o monoclonal B-cells or abn T-cell phenotypes, no evid of a lymphoproliferative process; the nodes resolve w/ Rx (Antibiotics and Pred)- no recurrence to date...     Hx mult skin cancers> squamous cell lesions and basal cell lesions removed by Derm  PLAN>>      We will arrange for a sleep lab PSG to evaluate him for OSA,  check oxygenation, & recheck his PLMD;  His more urgent need however is for Cariology follow up in light of his new onset AFib & hx of intermittent RBBB/ LAD/ 1st degree AVB.Marland KitchenMarland Kitchen In the interim we will check CXR, Labs, 2DEcho and plan Spirometry & ambulatory oximitry on ret...      Plan:     Patient's Medications  New Prescriptions   No medications on file  Previous Medications   ASPIRIN 81 MG TABLET    Take 81 mg by mouth daily.   BENZONATATE (TESSALON) 100 MG CAPSULE    Take 1 capsule (100 mg total) by mouth 2 (two) times daily as needed for cough.   ERGOCALCIFEROL (VITAMIN D2) 50000 UNITS CAPSULE    Take 50,000 Units by mouth once a week.   FISH OIL-OMEGA-3 FATTY ACIDS 1000 MG CAPSULE     Take 2 g by mouth daily.   FLUTICASONE (FLONASE) 50 MCG/ACT NASAL SPRAY    Place 2 sprays into both nostrils daily.   GLUCOSAMINE-CHONDROITIN 500-400 MG TABLET    Take 1 tablet by mouth daily.   LEVOTHYROXINE (SYNTHROID, LEVOTHROID) 100 MCG TABLET    Take 100 mcg by mouth daily.   METFORMIN (GLUCOPHAGE) 500 MG TABLET    Take 1 tablet (500 mg total) by mouth 2 (two) times daily with a meal.   METHYLSULFONYLMETHANE (MSM) 500 MG CAPS    Take 1 capsule by mouth daily.   MULTIPLE VITAMIN (MULTIVITAMIN WITH MINERALS) TABS TABLET    Take 1 tablet by mouth daily. Reported on 08/06/2015   ROPINIROLE (REQUIP) 3 MG TABLET    Take 5 mg by mouth at bedtime.    SIMVASTATIN (ZOCOR) 40 MG TABLET    Take 40 mg by mouth daily before breakfast. Reported on 02/06/2016  Modified Medications   No medications on file  Discontinued Medications   No medications on file

## 2016-02-06 NOTE — Patient Instructions (Signed)
Travis Bradford -- it was a pleasure meeting you today...  We will proceed w/ your SLEEP/ snoring concerns by rechecking your SLEEP STUDY at our [facility] sleep lab...    Today's evaluation revealed an irregular heart rhythm -- ATRIAL FIBRILLATION...    We will set you up to see a cardiologist ASAP.Marland KitchenMarland Kitchen  In the meanwhile we will check your blood work & a CXR and call you w/ the results when they are avail...  Call for any questions or if I can be of service in any way...  Let's plan a follow up visit in about 6-8 weeks, sooner if needed for any problems.Marland KitchenMarland Kitchen

## 2016-02-07 ENCOUNTER — Other Ambulatory Visit: Payer: Self-pay | Admitting: *Deleted

## 2016-02-07 DIAGNOSIS — R9431 Abnormal electrocardiogram [ECG] [EKG]: Secondary | ICD-10-CM

## 2016-02-11 DIAGNOSIS — J029 Acute pharyngitis, unspecified: Secondary | ICD-10-CM | POA: Diagnosis not present

## 2016-02-12 ENCOUNTER — Other Ambulatory Visit: Payer: Self-pay

## 2016-02-12 ENCOUNTER — Ambulatory Visit (HOSPITAL_COMMUNITY): Payer: Medicare Other | Attending: Cardiovascular Disease

## 2016-02-12 DIAGNOSIS — E039 Hypothyroidism, unspecified: Secondary | ICD-10-CM | POA: Diagnosis not present

## 2016-02-12 DIAGNOSIS — I340 Nonrheumatic mitral (valve) insufficiency: Secondary | ICD-10-CM | POA: Diagnosis not present

## 2016-02-12 DIAGNOSIS — R9431 Abnormal electrocardiogram [ECG] [EKG]: Secondary | ICD-10-CM | POA: Diagnosis not present

## 2016-02-12 DIAGNOSIS — E785 Hyperlipidemia, unspecified: Secondary | ICD-10-CM | POA: Diagnosis not present

## 2016-02-12 DIAGNOSIS — Z87891 Personal history of nicotine dependence: Secondary | ICD-10-CM | POA: Insufficient documentation

## 2016-02-12 DIAGNOSIS — I517 Cardiomegaly: Secondary | ICD-10-CM | POA: Diagnosis not present

## 2016-02-12 DIAGNOSIS — E119 Type 2 diabetes mellitus without complications: Secondary | ICD-10-CM | POA: Diagnosis not present

## 2016-02-12 DIAGNOSIS — C8590 Non-Hodgkin lymphoma, unspecified, unspecified site: Secondary | ICD-10-CM | POA: Insufficient documentation

## 2016-02-13 ENCOUNTER — Encounter: Payer: Self-pay | Admitting: Physician Assistant

## 2016-02-18 ENCOUNTER — Encounter: Payer: Self-pay | Admitting: Physician Assistant

## 2016-02-18 NOTE — Progress Notes (Addendum)
Cardiology Office Note    Date:  02/19/2016  ID:  Travis Bradford, DOB 1926-04-27, MRN [id number] PCP:  No PCP Per Patient  Cardiologist:  Dr. [name] remotely - 09/2012 (>3 yrs)  Chief Complaint: evaluate afib  History of Present Illness:  Travis Bradford is a 82 y.o. male with history of DM, HLD, BPH, prostate CA, remote lymphoma 1990s, arthritis, sinus bradycardia (prior sinus HR 48-70s), intermittent RBBB, PVCs who presents for evaluation of atrial fib. He saw Dr. [name] in 2014 for pre-op eval prior to TURP. He had abnormal stress echo showing hypokinesis of the mid-distal inferior wall suggestive of ischemia; + frequent PVCs, no ST changes or CP. Dr. [name] had a long discussion with the patient about further w/u versus medical therapy and the patient elected medical therapy. He recently saw his Dr. Lenna Gilford for sleep eval who is planning repeat PSG. He was noted to be in new onset atrial fib with HR 116. Labs showed normal TSH, Hgb 13.2, ESR 32, BNP 219 (similar to prior), CMET normal - Cr 0.93. 2D echo 02/12/2016: EF 40-45%, hypokinesis of the basal-midinferolateral and inferior myocardium, mild MR, mildly dilated LA/RV/RA.  He comes in today to re-establish care. He has struggled with fatigue for many years - wakes frequently around 4am and can't fall back asleep. No particular cause identified, just insomnia. No orthopnea. He subsequently has had daytime fatigue. He drinks 3 cups of coffee daily to combat the fatigue. He denies any chest pain, dyspnea, LEE, PND, palpitations or any awareness of his elevated HR whatsoever. He has remained active. He used to swim but stopped because of the environment in the pool. He has been walking regularly without any functional limitation. No bleeding.   Past Medical History:  Diagnosis Date  . Abnormal stress test    a. 07/2012: abnormal stress echo showing hypokinesis of the mid-distal inferior wall suggestive of ischemia; + frequent PVCs, no ST  changes or CP. Dr. [name] had a long discussion with the patient about further w/u versus medical therapy and the patient elected medical therapy..  . Arthritis   . Atrial fibrillation ([place])    a. Dx 01/2016.  [name] BPH (benign prostatic hyperplasia)   . Diabetes mellitus, type 2 ([place])   . History of blood transfusion   . Hyperlipidemia   . Hypothyroid   . Lymphoma ([place])    15 years ago  . Pneumonia   . Prostate cancer ([place])   . PVC's (premature ventricular contractions)    a. 07/2012 - seen on stress echo.  [name] RBBB    a. h/o intermittent rbbb.  . Sinus bradycardia     Past Surgical History:  Procedure Laterality Date  . APPENDECTOMY    . Athroscopic knee surgery    . dental implants    . GREEN LIGHT LASER TURP (TRANSURETHRAL RESECTION OF PROSTATE  08/29/2012   Procedure: GREEN LIGHT LASER TURP (TRANSURETHRAL RESECTION OF PROSTATE;  Surgeon: [name], MD;  Location: [facility];  Service: Urology;  Laterality: N/A;  . MOHS SURGERY     x 6    Current Medications: Current Outpatient Prescriptions  Medication Sig Dispense Refill  . aspirin 81 MG tablet Take 81 mg by mouth daily.    . benzonatate (TESSALON) 100 MG capsule Take 1 capsule (100 mg total) by mouth 2 (two) times daily as needed for cough. 60 capsule 5  . ergocalciferol (VITAMIN D2) 50000 UNITS capsule Take 50,000 Units by mouth once a week.    [name]  fish oil-omega-3 fatty acids 1000 MG capsule Take 2 g by mouth daily.    . fluticasone (FLONASE) 50 MCG/ACT nasal spray Place 2 sprays into both nostrils daily. 16 g 10  . glucosamine-chondroitin 500-400 MG tablet Take 1 tablet by mouth daily.    . levothyroxine (SYNTHROID, LEVOTHROID) 100 MCG tablet Take 100 mcg by mouth daily.    . metFORMIN (GLUCOPHAGE) 500 MG tablet Take 1 tablet (500 mg total) by mouth 2 (two) times daily with a meal. 60 tablet 1  . Methylsulfonylmethane (MSM) 500 MG CAPS Take 1 capsule by mouth daily.    . Multiple Vitamin (MULTIVITAMIN WITH MINERALS)  TABS tablet Take 1 tablet by mouth daily. Reported on 08/06/2015    . rOPINIRole (REQUIP) 3 MG tablet Take 5 mg by mouth at bedtime.      No current facility-administered medications for this visit.      Allergies:   Review of patient's allergies indicates no known allergies.   Social History   Social History  . Marital status: Widowed    Spouse name: N/A  . Number of children: 2  . Years of education: N/A   Social History Main Topics  . Smoking status: Former Smoker  . Smokeless tobacco: Never Used  . Alcohol use Yes     Comment: Rarely  . Drug use: Unknown  . Sexual activity: Not Asked   Other Topics Concern  . None   Social History Narrative  . None     Family History:  The patient's family history includes CAD in his mother; Stroke in his brother.   ROS:   Please see the history of present illness.  All other systems are reviewed and otherwise negative.    PHYSICAL EXAM:   VS:  BP 110/62   Pulse (!) 122   Ht 5' 10.5" (1.791 m)   Wt 215 lb (97.5 kg)   SpO2 97%   BMI 30.41 kg/m   BMI: Body mass index is 30.41 kg/m. GEN: Well nourished, well developed WM, in no acute distress  HEENT: normocephalic, atraumatic Neck: no JVD, carotid bruits, or masses Cardiac: tachycardic, irregular, no murmurs, rubs, or gallops, no edema  Respiratory:  clear to auscultation bilaterally, normal work of breathing GI: soft, nontender, nondistended, + BS MS: no deformity or atrophy  Skin: warm and dry, no rash Neuro:  Alert and Oriented x 3, Strength and sensation are intact, follows commands Psych: euthymic mood, full affect  Wt Readings from Last 3 Encounters:  02/19/16 215 lb (97.5 kg)  02/06/16 217 lb 8 oz (98.7 kg)  08/06/15 219 lb 3.2 oz (99.4 kg)      Studies/Labs Reviewed:   EKG:  EKG was ordered today and personally reviewed by me and demonstrates atrial fib 122bpm with RVR, no acute ST-T changes left axis deviation.  Recent Labs: 02/06/2016: ALT 14; BUN 21;  Creatinine, Ser 0.93; Hemoglobin 13.2; Platelets 326.0; Potassium 4.3; Pro B Natriuretic peptide (BNP) 219.0; Sodium 138; TSH 3.85   Lipid Panel No results found for: CHOL, TRIG, HDL, CHOLHDL, VLDL, LDLCALC, LDLDIRECT  Additional studies/ records that were reviewed today include: Summarized above.    ASSESSMENT & PLAN:   The patient was seen and examined by myself and Dr. McAlhany since the patient is technically new (re-establishing care after >3 yrs).  1. New onset atrial fib - his fatigue precedes his afib, so it's unclear how symptomatic he is with this. D/w MD. Start metoprolol 12.5mg BID, keeping in mind prior sinus   HR of low 50s. CHADSVASC = at least 4 for CHF, age, DM. Start Eliquis 5mg BID. Risks/benefits discussed with the patient who will observe for any bleeding issues. Bring back in for recheck of HR in 7-10 days to the afib clinic. If still in afib at that time would recommend considering DCCV after 3 weeks of uninterrupted anticoagulation to give him a chance of restoration to NSR. I encouraged him to continue his sleep workup with Dr. Nadel as treatment of OSA if present would be beneficial in management of his afib. Also instructed to cut down caffeine. Stop aspirin since we are starting Eliquis.  2. H/o bradycardia - monitor HR closely with med adjustment. His son will follow his pulse at home and call if he becomes too slow or has symptoms of bradycardia. We discussed the possibility that some patients require pacemaker for tx of tachy/brady. 3. Acute systolic CHF - new LV dysfunction identified on echocardiogram. Fortunately he is asymptomatic. BNP recently was only minimally elevated. Cannot start ACEI/ARB due to softer BP. Start BB as above. Etiology not presently known - could be tachy-mediated, but also will need to consider ischemic workup once HR better controlled. We discussed CHF precautions - low sodium diet, 2L fluid restriction, daily weights. His son says he eats salt  like it's going out of style. 4. Abnormal stress test - he remains without anginal sx. His son describes him as the type that is not quick to seek out medical procedures. Will attempt to control AF first, then consider LHC for definitive eval of abnormal stress test & fatigue. 5. H/o PVCs - quiescent on today's EKG.  Disposition: F/u with APP 7-10 days - afib clinic if they have available.   Medication Adjustments/Labs and Tests Ordered: Current medicines are reviewed at length with the patient today.  Concerns regarding medicines are outlined above. Medication changes, Labs and Tests ordered today are summarized above and listed in the Patient Instructions accessible in Encounters.   Signed,   PA-C  02/19/2016 9:27 AM    [facility] [address], [place], [place]  [address] Phone: [phone]; Fax: [phone]  

## 2016-02-19 ENCOUNTER — Ambulatory Visit (INDEPENDENT_AMBULATORY_CARE_PROVIDER_SITE_OTHER): Payer: Medicare Other | Admitting: Physician Assistant

## 2016-02-19 ENCOUNTER — Encounter: Payer: Self-pay | Admitting: Physician Assistant

## 2016-02-19 VITALS — BP 110/62 | HR 122 | Ht 70.5 in | Wt 215.0 lb

## 2016-02-19 DIAGNOSIS — I4891 Unspecified atrial fibrillation: Secondary | ICD-10-CM

## 2016-02-19 DIAGNOSIS — R9439 Abnormal result of other cardiovascular function study: Secondary | ICD-10-CM

## 2016-02-19 DIAGNOSIS — I493 Ventricular premature depolarization: Secondary | ICD-10-CM

## 2016-02-19 DIAGNOSIS — I5021 Acute systolic (congestive) heart failure: Secondary | ICD-10-CM

## 2016-02-19 DIAGNOSIS — Z87898 Personal history of other specified conditions: Secondary | ICD-10-CM

## 2016-02-19 DIAGNOSIS — Z8679 Personal history of other diseases of the circulatory system: Secondary | ICD-10-CM | POA: Diagnosis not present

## 2016-02-19 MED ORDER — APIXABAN 5 MG PO TABS
5.0000 mg | ORAL_TABLET | Freq: Two times a day (BID) | ORAL | 5 refills | Status: DC
Start: 2016-02-19 — End: 2016-02-20

## 2016-02-19 MED ORDER — METOPROLOL TARTRATE 25 MG PO TABS: 12.5000 mg | ORAL_TABLET | Freq: Two times a day (BID) | ORAL | 6 refills | Status: DC

## 2016-02-19 NOTE — Patient Instructions (Addendum)
Medication Instructions:   STOP TAKING ASPIRIN 81 MG   START TAKING METOPROLOL 12.5 MG  TWICE   A DAY   START TAKING ELIQUIS 5 MG TWICE A DAY   If you need a refill on your cardiac medications before your next appointment, please call your pharmacy.  Labwork: NONE ORDER TODAY    Testing/Procedures: NONE ORDER TODAY    Follow-Up: IN 7 TO 10 DAYS WITH AN AVAILABLE APP   IN 3 MONTHS WITH CRENSHAW   Any Other Special Instructions Will Be Listed Below (If Applicable).  Cut down on your caffeine intake (1 cup a day or less).  Follow your heart rate at home and call if it is running less than 50, greater than 100, or if you have any new symptoms.  One of your heart tests showed weakness of the heart muscle this admission. This may make you more susceptible to weight gain from fluid retention, which can lead to symptoms that we call heart failure. Follow these special instructions:  1. Follow a low-salt diet and watch your fluid intake. In general, you should not be taking in more than 2 liters of fluid per day (no more than 8 glasses per day). Some patients are restricted to less than 1.5 liters of fluid per day (no more than 6 glasses per day). This includes sources of water in foods like soup, coffee, tea, milk, etc. 2. Weigh yourself on the same scale at same time of day and keep a log. 3. Call your doctor: (Anytime you feel any of the following symptoms)  - 3-4 pound weight gain in 1-2 days or 2 pounds overnight  - Shortness of breath, with or without a dry hacking cough  - Swelling in the hands, feet or stomach  - If you have to sleep on extra pillows at night in order to breathe   IT IS IMPORTANT TO LET YOUR DOCTOR KNOW EARLY ON IF YOU ARE HAVING SYMPTOMS SO WE CAN HELP YOU!

## 2016-02-20 ENCOUNTER — Telehealth: Payer: Self-pay | Admitting: Cardiology

## 2016-02-20 MED ORDER — APIXABAN 5 MG PO TABS: 5.0000 mg | ORAL_TABLET | Freq: Two times a day (BID) | ORAL | 3 refills | Status: DC

## 2016-02-20 NOTE — Telephone Encounter (Signed)
Returned call to pt's son, ok per DPR. His son said they needed to RX for Eliquis faxed to the [facility] in [place] so that the pt can utilize his VA benefits. The pt picked up a 30-day supply from local pharmacy but prefers to use the [facility]. Rx for Eliquis printed and faxed along with last office visit note to [facility] at fax # [phone]; phone # [phone].  He verbalized understanding.

## 2016-02-20 NOTE — Telephone Encounter (Signed)
New message   Pt son is calling because Sharrell Ku wrote a new prescription for Eliquis but pt son verbalized that [employer] is too expensive he would like a new prescription for the VA to take care of getting it filled

## 2016-02-25 ENCOUNTER — Telehealth: Payer: Self-pay | Admitting: Cardiology

## 2016-02-25 NOTE — Telephone Encounter (Signed)
Great assessment, Travis Bradford. I agree with continuing BB at low dose as his present HR is MUCH improved from before and he does not have any symptoms of low BP. He is on the lowest dose of metoprolol. He did not have any sx of CHF when we saw him in clinic and recent BNP was only marginally elevated, not really suggestive of fluid overload. I suspect his fatigue is a combo of the BB iniation and longstanding caffeine withdrawal as you suggested. This will probably take several days to improve. Needs to practice good sleep hygiene - regular bedtime routine, d/c all electronics 2 hours before bed, completely dark room, sleeping when tired.   Would recommend to continue to follow vital signs throughout the day - I see he sees Dr. Lenna Gilford for pulmonology/primary care. If pulse ox continues to remain low he should touch base with him to find out what further workup is necessary for low O2 sats. His PCP may want to do a CXR to rule out a PNA or other issues. (It is not unusual for other medical issues to drive afib.) Please make sure he is having no symptoms of bleeding.  F/u as planned. Thanks.  Kadeen Sroka PA-C

## 2016-02-25 NOTE — Telephone Encounter (Signed)
Spoke to son of patient. Patient recently seen for evaluation of new onset A Fib discovered in Dr. Jeannine Kitten office. He was seen by Melina Copa, PA on 7/26 - started on metoprolol and Eliquis.  Concerned over O2 level, was 90-91% when checked this AM. Pt not having any apparent weight gain/leg swelling, denies orthopnea, denies acute dyspnea. Son notes O2 sats typically mid 90s and he is concerned about this change. This was a 1 time reading and I encouraged him to continue to monitor.  Son notes concern that patient is "really fatigued", notes he has cut coffee intake (used to drink 3 cups of coffee before breakfast). He was also not aware of the potential fatigue related SEs of metoprolol and we discussed that this could be contributing to the patient's energy level. Reassured him this is common w/ initiation of BBs. Informed him this could continue for another week or so, but to discuss at next visit. He voiced understanding.  He mentions that the HR is now in 69s w patient being on metoprolol, down to 50s at times. BP ~100/78 where it was [id number] systolic before. Pt is not having any symptoms of dizziness/lightheadedness on standing. Advised that as long as he is asymptomatic, BP is OK - continue to monitor and call if further drops. Will seek advice on whether any adjustment needs to be made on BB, he is currently on 12.5mg  metoprolol BID so aware we may not be able to lower dose.  Caller aware I will route to provider for further recommendations. He understands to have patient follow up w Roderic Palau as scheduled.

## 2016-02-25 NOTE — Telephone Encounter (Signed)
Returned call to son of patient and gave recommendations - he voiced understanding and thanks, will follow up at A Fib clinic and call back sooner if new concerns. Plans to follow up w Dr. Lenna Gilford as currently scheduled and call them if remaining issues w O2 saturation.

## 2016-02-25 NOTE — Telephone Encounter (Signed)
New message       Son states that pt oxygen level is 90-91.  His pulse is 50-51 and bp 100/78.  He was recently seen by a PA and had medication changes,  He is extremely fatigued.  Son is worried about his low oxygen level.  Please call

## 2016-02-26 ENCOUNTER — Telehealth: Payer: Self-pay | Admitting: Pulmonary Disease

## 2016-02-26 NOTE — Telephone Encounter (Signed)
Per SN---  Seen by cards, Dr. Angelena Form and the PA on 7/26.    Pt was started on metoprolol 12.5 BID (this is a low dose) and this could contribute to the fatigue but usually in larger doses.  Dr. Lenna Gilford suggests the following:  1.  Check his heart rate (pulse) and let cardiology know about the increase in fatigue. 2. Remember no salt in his diet 3. Keep record of pulse and oxygen saturation 4. Keep his follow up with SN on 8/17.    Called and spoke with pts son and he is aware of SN recs.

## 2016-02-26 NOTE — Telephone Encounter (Signed)
Pt last OV 7/13 for sleep consult. Spoke with pt son who states pt seen cardiology on Thursday 7/31 and started betablocker on Friday 8/1 about 2 days after starting the betablocker he started having increased fatigued and being "foggy". Pt son is checking 02 sats and said his dad's 02 level is getting as low as 89 at rest, he is concerned that his dad isn't getting enough oxygen.  SN please advise. Thanks.

## 2016-02-28 ENCOUNTER — Encounter (HOSPITAL_COMMUNITY): Payer: Self-pay | Admitting: Nurse Practitioner

## 2016-02-28 ENCOUNTER — Ambulatory Visit (HOSPITAL_COMMUNITY)
Admission: RE | Admit: 2016-02-28 | Discharge: 2016-02-28 | Disposition: A | Discharge status: Home / Self Care | Payer: Medicare Other | Source: Ambulatory Visit | Attending: Nurse Practitioner | Admitting: Nurse Practitioner

## 2016-02-28 VITALS — BP 112/74 | HR 93 | Ht 70.5 in | Wt 220.8 lb

## 2016-02-28 DIAGNOSIS — Z79899 Other long term (current) drug therapy: Secondary | ICD-10-CM | POA: Diagnosis not present

## 2016-02-28 DIAGNOSIS — I4510 Unspecified right bundle-branch block: Secondary | ICD-10-CM | POA: Diagnosis not present

## 2016-02-28 DIAGNOSIS — E119 Type 2 diabetes mellitus without complications: Secondary | ICD-10-CM | POA: Diagnosis not present

## 2016-02-28 DIAGNOSIS — Z9889 Other specified postprocedural states: Secondary | ICD-10-CM | POA: Diagnosis not present

## 2016-02-28 DIAGNOSIS — E039 Hypothyroidism, unspecified: Secondary | ICD-10-CM | POA: Diagnosis not present

## 2016-02-28 DIAGNOSIS — N400 Enlarged prostate without lower urinary tract symptoms: Secondary | ICD-10-CM | POA: Insufficient documentation

## 2016-02-28 DIAGNOSIS — Z8249 Family history of ischemic heart disease and other diseases of the circulatory system: Secondary | ICD-10-CM | POA: Insufficient documentation

## 2016-02-28 DIAGNOSIS — M1990 Unspecified osteoarthritis, unspecified site: Secondary | ICD-10-CM | POA: Insufficient documentation

## 2016-02-28 DIAGNOSIS — I4891 Unspecified atrial fibrillation: Secondary | ICD-10-CM

## 2016-02-28 DIAGNOSIS — E785 Hyperlipidemia, unspecified: Secondary | ICD-10-CM | POA: Insufficient documentation

## 2016-02-28 DIAGNOSIS — I480 Paroxysmal atrial fibrillation: Secondary | ICD-10-CM | POA: Insufficient documentation

## 2016-02-28 DIAGNOSIS — Z87891 Personal history of nicotine dependence: Secondary | ICD-10-CM | POA: Diagnosis not present

## 2016-02-28 DIAGNOSIS — Z7901 Long term (current) use of anticoagulants: Secondary | ICD-10-CM | POA: Diagnosis not present

## 2016-02-28 DIAGNOSIS — Z8546 Personal history of malignant neoplasm of prostate: Secondary | ICD-10-CM | POA: Diagnosis not present

## 2016-03-02 ENCOUNTER — Encounter (HOSPITAL_COMMUNITY): Payer: Self-pay | Admitting: Nurse Practitioner

## 2016-03-02 NOTE — Progress Notes (Signed)
Patient ID: Travis Bradford, male   DOB: 1925-08-26, 82 y.o.   MRN: [id number]     Primary Care Physician: [facility] Referring Physician:Dana Dunn,PA   Travis Bradford is a 82 y.o. male with a h/o DM, HLD, BPH, prostate CA, remote lymphoma 1990s, arthritis, sinus bradycardia (prior sinus HR 48-70s), intermittent RBBB in afib clinic for eval of new onset aifb. This was present at an exam with his pulmonologist 7/26. Of note he does snore and is pending a sleep study, he has had fatigue for years. He was seen by Travis Bradford 7/26 and was started on low dose BB(brady in past) and Eliquis for chadsvasc score of 4.  EKG reveals afib at 93 bpm, improvement of v rate of 122 bpm on previous visit. Despite better rate control, his son believes that he is more fatigued since starting meds and he is sleeping more. He also feels that he is more winded with activities. He does not drink alcohol, 3 cups of coffee a day but he has switched to decaf, no tobacco. He does snore with witnessed apnea epiosdoes and sleep study is within the next two weeks.  Today, he denies symptoms of palpitations, chest pain, orthopnea, PND, lower extremity edema, dizziness, presyncope, syncope, or neurologic sequela. The patient is tolerating medications without difficulties and is otherwise without complaint today.   Past Medical History:  Diagnosis Date  . Abnormal stress test    a. 07/2012: abnormal stress echo showing hypokinesis of the mid-distal inferior wall suggestive of ischemia; + frequent PVCs, no ST changes or CP. Dr. [name] had a long discussion with the patient about further w/u versus medical therapy and the patient elected medical therapy..  . Arthritis   . Atrial fibrillation ([place])    a. Dx 01/2016.  [name] BPH (benign prostatic hyperplasia)   . Diabetes mellitus, type 2 ([place])   . History of blood transfusion   . Hyperlipidemia   . Hypothyroid   . Lymphoma ([place])    15 years ago  . Pneumonia   . Prostate cancer ([place])   .  PVC's (premature ventricular contractions)    a. 07/2012 - seen on stress echo.  [name] RBBB    a. h/o intermittent rbbb.  . Sinus bradycardia    Past Surgical History:  Procedure Laterality Date  . APPENDECTOMY    . Athroscopic knee surgery    . dental implants    . GREEN LIGHT LASER TURP (TRANSURETHRAL RESECTION OF PROSTATE  08/29/2012   Procedure: GREEN LIGHT LASER TURP (TRANSURETHRAL RESECTION OF PROSTATE;  Surgeon: [name], MD;  Location: [facility];  Service: Urology;  Laterality: N/A;  . MOHS SURGERY     x 6    Current Outpatient Prescriptions  Medication Sig Dispense Refill  . apixaban (ELIQUIS) 5 MG TABS tablet Take 1 tablet (5 mg total) by mouth 2 (two) times daily. 180 tablet 3  . ergocalciferol (VITAMIN D2) 50000 UNITS capsule Take 50,000 Units by mouth once a week.    . fish oil-omega-3 fatty acids 1000 MG capsule Take 2 g by mouth daily.    [name] glucosamine-chondroitin 500-400 MG tablet Take 1 tablet by mouth daily.    [name] levothyroxine (SYNTHROID, LEVOTHROID) 100 MCG tablet Take 100 mcg by mouth daily.    . Methylsulfonylmethane (MSM) 500 MG CAPS Take 1 capsule by mouth daily.    . metoprolol tartrate (LOPRESSOR) 25 MG tablet Take 0.5 tablets (12.5 mg total) by mouth 2 (two) times daily. 30 tablet 6  .  Multiple Vitamin (MULTIVITAMIN WITH MINERALS) TABS tablet Take 1 tablet by mouth daily. Reported on 08/06/2015    . rOPINIRole (REQUIP) 3 MG tablet Take 5 mg by mouth at bedtime.     . fluticasone (FLONASE) 50 MCG/ACT nasal spray Place 2 sprays into both nostrils daily. (Patient not taking: Reported on 02/28/2016) 16 g 10   No current facility-administered medications for this encounter.     No Known Allergies  Social History   Social History  . Marital status: Widowed    Spouse name: N/A  . Number of children: 2  . Years of education: N/A   Occupational History  . Not on file.   Social History Main Topics  . Smoking status: Former Research scientist (life sciences)  . Smokeless tobacco:  Never Used  . Alcohol use Yes     Comment: Rarely  . Drug use: Unknown  . Sexual activity: Not on file   Other Topics Concern  . Not on file   Social History Narrative  . No narrative on file    Family History  Problem Relation Age of Onset  . CAD Mother   . Stroke Brother      X 2    ROS- All systems are reviewed and negative except as per the HPI above  Physical Exam: Vitals:   02/28/16 1138  BP: 112/74  Pulse: 93  Weight: 220 lb 12.8 oz (100.2 kg)  Height: 5' 10.5" (1.791 m)    GEN- The patient is well appearing, alert and oriented x 3 today.   Head- normocephalic, atraumatic Eyes-  Sclera clear, conjunctiva pink Ears- hearing intact Oropharynx- clear Neck- supple, no JVP Lymph- no cervical lymphadenopathy Lungs- Clear to ausculation bilaterally, normal work of breathing Heart- irregular rate and rhythm, no murmurs, rubs or gallops, PMI not laterally displaced GI- soft, NT, ND, + BS Extremities- no clubbing, cyanosis, or edema MS- no significant deformity or atrophy Skin- no rash or lesion Psych- euthymic mood, full affect Neuro- strength and sensation are intact  EKG-afib at 93 bpm, LAD, qrs int 112 ms , qtc 445 ms   Echo- 7/17- - Left ventricle: The cavity size was normal. Wall thickness was   normal. Systolic function was mildly to moderately reduced. The   estimated ejection fraction was in the range of 40% to 45%.   Hypokinesis of the basal-midinferolateral and inferior   myocardium. - Mitral valve: Calcified annulus. There was mild regurgitation. - Left atrium: The atrium was mildly dilated. - Right ventricle: The cavity size was mildly dilated. - Right atrium: The atrium was mildly dilated.  Labs, creatinine 0.93, K+ 4.3, cbc unremarkable, TSH 3.85, BNP 2.19  Assessment and Plan: 1. New onset afib Reasonable rate control with low dose BB, but may be contributing to more fatigue, he may adjust better to drug with time. If not consider  metoprolol ER 25 mg 1/2 -1 tab at hs. I would consider CCB, but with reduced EF BB best approach. Very possible just afib itself is contributing to more fatigue and after on blood thinner for 3 weeks with consider DCCV to see if can restore SR and if see if pt feels improved in SR.  Continue eliquis, do not miss any doses, bleeding precautions discussed  2. Probable sleep apnea with witnessed snoring and apnea Sleep study pending, likely contributing to afib as well as fatigue  F/u in afib clinic in 3 weeks for review sleep study as well as to possibly schedule DCCV  Butch Penny C. Kayleen Memos, ANP-C  [facility] [address] [place], [place] [address] [phone]

## 2016-03-05 ENCOUNTER — Telehealth: Payer: Self-pay | Admitting: Cardiology

## 2016-03-05 NOTE — Telephone Encounter (Signed)
Would continue metoprolol for now; otherwise HR may increase; symptoms may be from A fib; would arrange PAOV to schedule TEE guided [place]

## 2016-03-05 NOTE — Telephone Encounter (Signed)
Pt.'s son called and is concerned since pt does not feel well since being diagnosed with afib and BB was added, as well as DOAC. He was seen by [facility] with labs, no remarkable findings. He has just been on new meds two weeks. Has appointment with me to get set up for cardioversion when on blood thinner x 3 weeks. Son is convinced it is the BB,possible so,but is probably more of combination afib and med. Son says he is rate controlled in the 80's at home. Will stop BB for a few days to see if energy improves and if he stays rate controlled may not need BB, other option if HR increases, is Cardizem. His father is convinced that something is bad wrong with his heart and wants to see Dr. [name] sooner than October. Will  Staff message and see if possible.Son will call me back first of the week and let me know results off BB.

## 2016-03-05 NOTE — Telephone Encounter (Signed)
New message     Pt son calling an earlier appt w/Dr. [name]. He is scheduled with Dr. On 05/08/16. Son does not want to see a APP. Son states that pt is not doing well on new medicine.     Pt c/o medication issue:  1. Name of Medication: eliquis 5mg ,   2. How are you currently taking this medication (dosage and times per day)? Twice a day   3. Are you having a reaction (difficulty breathing--STAT)? no   4. What is your medication issue? Extreme Fatique, foggyness, weakness.

## 2016-03-05 NOTE — Telephone Encounter (Signed)
Per note Butch Penny sent provider staff msg - will route to Dr. [name] for recommendations.

## 2016-03-05 NOTE — Telephone Encounter (Signed)
No answer when dialed - Luka Nabarro is pt's only listed contact and phone #. Left [name] a message requesting call back to discuss Dr. Jacalyn Lefevre recommendations.

## 2016-03-06 ENCOUNTER — Other Ambulatory Visit (HOSPITAL_COMMUNITY): Payer: Self-pay | Admitting: *Deleted

## 2016-03-06 MED ORDER — METOPROLOL SUCCINATE ER 25 MG PO TB24: 25.0000 mg | ORAL_TABLET | Freq: Every day | ORAL | 3 refills | Status: DC

## 2016-03-06 NOTE — Telephone Encounter (Signed)
Recommendations given to Travis Bradford - however, we had lengthy discussion regarding his concerns about continuing the metoprolol "without a game plan". I did advised that we would be looking at the option of a cardioversion and he acknowledged that this was discussed at last appt w Butch Penny.  Pt's HR was up to 118 this morning, thinks this was bc pt missed last night's metoprolol dose. I advised to resume and continue it based on Dr. Jacalyn Lefevre recommendation, and the patient did take the med this morning. He would like patient to be seen by an MD. Aware I do not have availability currently on Dr. Jacalyn Lefevre schedule. We discussed possibility of returning to [facility]. He notes that Butch Penny discussed w/ him the option of doing long acting form of metoprolol - might produce better outcomes.  Reminded him that based on Dr. Jacalyn Lefevre recommendation we should be looking at getting cardioversion set up, which will involve adherence to med regimen and likely require interim visit in [facility] or in [facility] office. Unsure of further options and if adjunt medications may help given pt's continuing symptoms - will route to [place] for advice.  Caller aware, though reluctant to the idea, that the ER may be appropriate should patient develop new or worsening symptoms and HR remains elevated.

## 2016-03-06 NOTE — Telephone Encounter (Signed)
Butch Penny talked with pts son. He would prefer to try metoprolol succinate and see if this helps his father's symptoms to avoid TEE if possible. He will call back if pt does not tolerate metoprolol succinate at bedtime. He will start out with 12.5mg  and increase to 25mg  as tolerated. If symptoms do not improve then will reconsider TEE guided cardioversion otherwise will keep current plan to cardiovert after 3 weeks of anticoagulation. Son felt better after conversation and will call with further issues or concerns otherwise will see 03/26/2016 as previously scheduled.

## 2016-03-06 NOTE — Telephone Encounter (Signed)
Follow up   Pt son is requesting call back from [facility]

## 2016-03-09 ENCOUNTER — Telehealth (HOSPITAL_COMMUNITY): Payer: Self-pay | Admitting: *Deleted

## 2016-03-09 NOTE — Telephone Encounter (Signed)
Patient's son called in stating pt was feeling much better since changing to Toprol. Will call back if needs to be seen sooner than scheduled appt.

## 2016-03-12 ENCOUNTER — Ambulatory Visit (INDEPENDENT_AMBULATORY_CARE_PROVIDER_SITE_OTHER): Payer: Medicare Other | Admitting: Pulmonary Disease

## 2016-03-12 ENCOUNTER — Encounter: Payer: Self-pay | Admitting: Pulmonary Disease

## 2016-03-12 VITALS — BP 104/54 | HR 63 | Temp 97.7°F | Ht 70.5 in | Wt 221.0 lb

## 2016-03-12 DIAGNOSIS — R0683 Snoring: Secondary | ICD-10-CM

## 2016-03-12 MED ORDER — DILTIAZEM HCL ER COATED BEADS 120 MG PO CP24: 120.0000 mg | ORAL_CAPSULE | Freq: Every day | ORAL | 6 refills | Status: DC

## 2016-03-12 NOTE — Progress Notes (Addendum)
Subjective:     Patient ID: Travis Bradford, male   DOB: [birth year], 82 y.o.   MRN: [id number]  HPI  ~  February 06, 2016:  Initial pulmonary evaluation w/ SN>  His PCP is [facility] on [address], and he is also followed at the [place]...     82 y/o WM self referred for a pulmonary/ sleep eval due to snoring, daytime fatigue, hx PLMD, and concern for OSA;  He is here w/ his son who gives most of the Hx- he had sleep study 12/2008 in [place], [place] (report scanned into EPIC)=> NEG w/ AHI=1.6/hr, lowest O2sat=90%, signif PLMD w/ index=112.8/hr but no arousals noted (he had an arousal index of 16.8/hr recorded as "spontaneous" ?RERAs, treated for the RLS w/ Requip 11m Qhs... Pts son feels that he is diff now & recorded his Dad sleeping, played the recoding for me & it was just snoring, some moaning, no apneic periods detected;  Pt goes to bed around 10-11, wakes 1-2 to urinate & able to get back to sleep, awakes around 4-5AM & can't get back to sleep; son notes he has 2-3 cups of coffee & then naps in the morning, awakes from the nap feeling better, pt says he doesn't nap every day; not sufficienly active during the day, gets sleepy watching TV, not driving=> we will need to repeat his PSG & this is what the son wanted...   Smoking Hx>  Started smoking cigarettes in teens, up to 1/2ppd max, quit in his 551sbut smokes cigars/ pipes intermittently since then  Pulmonary Hx>  [place]'s had [place]outs of [place] recent [place]osp 11/2015-NOS; he denies asthma, never told of COPD, no hx Tb or known exposures...  [facility] Hx>  [facility];  Hx HL, DM, Hypothy, BPH, Hx non-hodgkins lymphoma, mult skin cancers...  Family Hx>  Hx + for heart dis and strokes, no hx lung dis in family...  Occup Hx>  Neg for asbestos, silica dust etc...  Current Meds>  Flonase, OTC Antihist prn, ASA81, Simva40, Synthroid100, Requip3, MVI;  He is off prev Metformin rx...     EXAM shows Afeb, VSS, O2sat=96% on  RA at rest; Wt=218#;  HEENT- neg, mallampati2;  Chest- sl decr BS at bases, scat rales, no wheezing or rhonchi;  Heart- irreg irreg Gr1/6 SEM w/o r/g;  Abd- soft, nontender, neg;  Ext- neg w/o c/c/e...   CXR 02/06/16>  Borderline heart size, Ao atherosclerosis, some scarring esp left base (no change), NAD, DDD in Tspine...  EKG 02/06/16>  AFib w/ rate ~116, LAD, min NSSTTWA...  LABS 02/06/16>  Chems- wnl,  BNP= 219;  CBC- wnl,  Sed=32...  2DEcho>  Ordered and pending...  Sleep Study>  Ordered and pending...   He will need PFTs, ambulatory oximetry, etc at a later date...  Problem List>>    Allergic Rhinitis. Hx epistaxis>  Says he was told to be allergic to cat dander & molds yrs ago by an allergist; treated w/ OTC antihist & Flonase prn; had epistaxis- seen in ER, f/u NLucia Gaskins..     Ex-smoker>  Started smoking cigarettes in teens, up to 1/2ppd max, quit in his 580sbut smokes cigars/ pipes intermittently since then...    Hx recurrent pneumonias, NOS>  He reports pneumonia x 2-3 in his life; [place] prob HCAP- 102Temp, chills, chest discomfort, hypoxemic- required O2,  WBC=26K & CXR w/ multilobar pneumonia; subseq CT angio was neg for PE but showed atherosclerotic Ao & coronaries, underlying emphysematous changes, patchy GGO c/w inflamm, no adenopathy, mult gallstones in GB, fatty liver, DJD in Tspine;  Cultures neg blood , no sput sent, neg viral panel;  Treated w/ Rocephin/Zithromax => Levaquin & improved...     New Onset Atrial Fib 01/2016>  Noted to have an irreg irreg pulse on today's exam, EKG w/ AFib, rate116, LAD, min NSSTTWA;  Hx prev cardiac eval by DrCrenshaw, last seen 09/2012 w/ abn stress echo (1/14) prior to TURP; it showed freq PVCs and isolated couplet, +HK in mid-distal IW suggestive of ischemia, no ST changes and no CP;  Rx w/ ASA, Statin, not cathed;  Pt did not f/u w/ Cards;  Old EKGs in 2013-14  showed SBrady, rate ~48 1st degree AVB & LAD;  In 2015 EKGs showed an intermittent RBBB + LAD .Marland KitchenMarland Kitchen He denies CP, palpit, dizzy, syncope, edema, etc => referred to Cards for eval + Echo, etc...     Hyperlipidemia>  On Simva40 + fish Oil    DM2>  On Metformin500Bid    Hypothyroid>  On Synthroid100     BPH w/ BOO>  Seen by DrTannenbam 2013- cysto showed prostatomegaly otherw neg & treated w/ "green light" laser resection    RLS>  On Requip 60m Qhs    Remote hx non-Hodgkin's Lymphoma ~1990s> no details avail; he had inguinal adenopathy, [place], [place] recurrence;  Then in 07/2015 he developed posterior left neck adenopathy- [facility] NEG for malignancy w/ lymphoid hyperplasia & plasmacytosis, no granuloma, no microrganisms, flow cytometry neg w/o monoclonal B-cells or abn T-cell phenotypes, no evid of a lymphoproliferative process; the nodes resolve w/ Rx (Antibiotics and Pred)- no recurrence to date...     Hx mult skin cancers> squamous cell lesions and basal cell lesions removed by Derm PLAN>>      We will arrange for a sleep lab PSG to evaluate him for OSA, check oxygenation, & recheck his PLMD;  His more urgent need however is for Cariology follow up in light of his new onset AFib & hx of intermittent RBBB/ LAD/ 1st degree AVB..Marland KitchenMarland KitchenIn the interim we will check CXR, Labs, 2DEcho and plan Spirometry & ambulatory oximitry on ret...   ADDENDUM 02/12/16>>  2DEcho showed norm LV size & wall thickness, mod decr LVF w/ EF=40-45% & HK of the basal-midinferolateral & inferior myocardium; mild MR, mild LA(442m & RA dil, mild RV dil w/ norm RVF...   ~  March 12, 2016:  72m64moV & pulmonary follow up visit>  After our initial consult 02/06/16 MarScottys seen by [facility] for his AFib, RBBB, cardiomyopathy w/ EF 40-45% w/ some hypokinesis, & prev abn myoview in 2014;  He was started on Metoprolol12.5Bid, Eliquis5Bid, low sodium diet (prev  BNP=219 but held off on Lasix/ ACE/ARB due to soft BP & pt feeling weak);  Pt & son are convinced he has been more "foggy" since the BBlocker started, and not improved w/ change to ToprolXL25- 1/2 tab daily;  They really want off the BBlocker 7 we discussed poss switch to CardizemCD120 although rate control has not been an issue 7 he might better be served w/ a low dose ARB- will leave to [facility] to address in follow up...     We are awaiting his Sleep Study 03/23/16> recall that he had  a prev PSG in [place] 2010 & it was NEG, except snoring and RLS w/ 113/hr but no arousals noted; he remains on Requip Qhs but ?dose- he has not brought meds to office for [place] to review; I called his Pharm ([facility]) & he's never filled it there, presumably getting it from the [place], pt lists ?92m tab taking 541md? EXAM shows Afeb, VSS, O2sat=98% on RA at rest; Wt=221#;  HEENT- neg, mallampati2;  Chest- sl decr BS at bases, scat rales, no wheezing or rhonchi;  Heart- irreg irreg Gr1/6 SEM w/o r/g;  Abd- soft, nontender, neg;  Ext- neg w/o c/c/e... NOTE> Pt dosed off to sleep in exam room, no snoring noted, no central apneas noted...  We reviewed prev CXR, EKG, 2DEcho, Labs... IMP/PLAN>>  Sleep study is pending & sched for 8/28;  He has Afib, RBBB, cardiomyopathy w/ EF 40-45% w/ some hypokinesis, & prev abn myoview in 2014=> eval by [facility] and he's proved INTOL to BBlockers, they request off the ToprolXL & I will switch him to [place]qu[place] although Cards may prefer ACE/ARB since he has the chr sys CHF & no real issue w/ rate control (so far); we plan f/u after the sleep study is read...   ADDENDUM>>  Sleep Study done 03/23/16 & read by DrClyde/31>  ModOSA w/ AHI=15.4/H & 23.6/H while supine;  Min O2sat=89%, loud snoring noted, AFib w/ mean HR=77/min, PLMS index=51.5/H & assoc arousal index=16.4/H... REC to start CPAP & continue his therapy for RLS...    I called son AdQuita Skyeo discuss & offered 1) CPAP titration study, 2)  start CPAP w/ Auto setting, 3) refer to Sleep specialist; he clearly desires to proceed w/ treatment & we will request [facility] to provide:  ResMed-S10 air/auto w/ heated humidity, climate controlled tubing, auto set 5-15, enroll in airview, & mask of choice w/ ROV for download after 6wks on the apparatus...    Past Medical History:  Diagnosis Date  . Abnormal stress test    a. 07/2012: abnormal stress echo showing hypokinesis of the mid-distal inferior wall suggestive of ischemia; + frequent PVCs, no ST changes or CP. Dr. CrStanford Breedad a long discussion with the patient about further w/u versus medical therapy and the patient elected medical therapy..  . Arthritis   . Atrial fibrillation ([facility] [place]   a. Dx 01/2016.  . Marland KitchenPH (benign prostatic hyperplasia)   . Diabetes mellitus, type 2 ([facility]  . History of blood transfusion   . Hyperlipidemia   . Hypothyroid   . [facility]oma ([facility]   15 years ago  . Pneumonia   . Prostate cancer ([facility]  . PVC's (premature ventricular contractions)    a. 07/2012 - seen on stress echo.  . Marland KitchenBBB    a. h/o intermittent rbbb.  . Sinus bradycardia     Past Surgical History:  Procedure Laterality Date  . APPENDECTOMY    . Athroscopic knee surgery    . dental implants    . GREEN LIGHT LASER TURP (TRANSURETHRAL RESECTION OF PROSTATE  08/29/2012   Procedure: GREEN LIGHT LASER TURP (TRANSURETHRAL RESECTION OF PROSTATE;  Surgeon: SiAilene RudMD;  Location: [facility];  Service: Urology;  Laterality: N/A;  . MOHS SURGERY     x 6    Outpatient Encounter Prescriptions as of 03/12/2016  Medication Sig  . apixaban (ELIQUIS) 5 MG TABS tablet Take 1 tablet (5 mg total) by mouth 2 (two) times daily.  . ergocalciferol (VITAMIN D2) 50000 UNITS  capsule Take 50,000 Units by mouth once a week.  . fish oil-omega-3 fatty acids 1000 MG capsule Take 2 g by mouth daily.  [name] glucosamine-chondroitin 500-400 MG tablet Take 1 tablet by mouth daily.  [name] levothyroxine (SYNTHROID, LEVOTHROID)  100 MCG tablet Take 100 mcg by mouth daily.  . Methylsulfonylmethane (MSM) 500 MG CAPS Take 1 capsule by mouth daily.  . Multiple Vitamin (MULTIVITAMIN WITH MINERALS) TABS tablet Take 1 tablet by mouth daily. Reported on 08/06/2015  . rOPINIRole (REQUIP) 3 MG tablet Take 5 mg by mouth at bedtime.   . [DISCONTINUED] metoprolol succinate (TOPROL XL) 25 MG 24 hr tablet Take 1 tablet (25 mg total) by mouth at bedtime. May start out with 1/2 tablet (12.36m) and advance as tolerated to 1 tablet (213m  . diltiazem (CARDIZEM CD) 120 MG 24 hr capsule Take 1 capsule (120 mg total) by mouth daily.  . fluticasone (FLONASE) 50 MCG/ACT nasal spray Place 2 sprays into both nostrils daily. (Patient not taking: Reported on 02/28/2016)   No facility-administered encounter medications on file as of 03/12/2016.     No Known Allergies   Immunization History  Administered Date(s) Administered  . Influenza,inj,Quad PF,36+ Mos 05/09/2013  [place] [facility] [facility] [address]...    Current Medications, Allergies, Past Medical History, Past Surgical History, Family History, and Social History were reviewed in [employer].   Review of Systems             All symptoms NEG except where BOLDED >>  Constitutional:  F/C/S, fatigue, anorexia, unexpected weight change. HEENT:  HA, visual changes, hearing loss, earache, nasal symptoms, sore throat, mouth sores, hoarseness. Resp:  cough, sputum, hemoptysis; SOB, tightness, wheezing. Cardio:  CP, palpit, DOE, orthopnea, edema. GI:  N/V/D/C, blood in stool; reflux, abd pain, distention, gas. GU:  dysuria, freq, urgency, hematuria, flank pain, voiding difficulty. MS:  joint pain, swelling, tenderness, decr ROM; neck pain, back pain, etc. Neuro:  HA, tremors, seizures, dizziness, syncope, weakness, numbness, gait abn. Skin:  suspicious lesions or skin rash. Heme:  adenopathy,  bruising, bleeding. Psyche:  confusion, agitation, sleep disturbance, hallucinations, anxiety, depression suicidal.      NOTE> long hx of fatigue complaints- pt thought it was B12 defic but labs were wnl...   Objective:   Physical Exam       Vital Signs:  Reviewed...   General:  WD, overwt, 8752/o WM in NAD; alert & oriented; pleasant & cooperative... HEENT:  [place]/AT; Conjunctiva- pink, Sclera- nonicteric, EOM-wnl, PERRLA, EACs-clear, TMs-wnl; NOSE-clear; THROAT-clear & wnl.  Neck:  Supple w/ fair ROM; no JVD; normal carotid impulses w/o bruits; no thyromegaly or nodules palpated; no lymphadenopathy.  Chest:  Decr BS bilat, clear to P & A x few scat rales, without wheezes/ rhonchi/ or signs of consolidation... Heart:  irreg irreg rhythm; gr1/6 SEM, without rubs or gallops detected. Abdomen:  Soft & nontender- no guarding or rebound; normal bowel sounds; no organomegaly or masses palpated. Ext:  Sl decr ROM; without deformities, +arthritic changes; no varicose veins, +venous insuffic, no edema;  Pulses intact w/o bruits. Neuro:  No focal neuro deficits, gait & balance OK. Derm:  No lesions noted; no rash etc. Lymph:  No cervical, supraclavicular, axillary, or inguinal adenopathy palpated.   Assessment:      Problem List>>    Allergic Rhinitis. Hx epistaxis>  Says he was told to be allergic  to cat dander & molds yrs ago by an allergist; treated w/ OTC antihist & Flonase prn; had epistaxis- seen in ER, f/u Lucia Gaskins...     Ex-smoker>  Started smoking cigarettes in teens, up to 1/2ppd max, quit in his 30s but smokes cigars/ pipes intermittently since then...    Hx recurrent pneumonias, NOS>  He reports pneumonia x 2-3 in his life; severe pneumonia in [place] yrs ago & was Hosp x 26[place]  [place] prob HCAP- 102Temp, chills, chest discomfort, hypoxemic- required O2, WBC=26K & CXR w/ multilobar pneumonia; subseq CT angio was neg for PE but showed atherosclerotic Ao &  coronaries, underlying emphysematous changes, patchy GGO c/w inflamm, no adenopathy, mult gallstones in GB, fatty liver, DJD in Tspine;  Cultures neg blood , no sput sent, neg viral panel;  Treated w/ Rocephin/Zithromax => Levaquin & improved...     New Onset Atrial Fib 01/2016>  Noted to have an irreg irreg pulse on today's exam, EKG w/ AFib, rate116, LAD, min NSSTTWA;  Hx prev cardiac eval by DrCrenshaw, last seen 09/2012 w/ abn stress echo (1/14) prior to TURP; it showed freq PVCs and isolated couplet, +HK in mid-distal IW suggestive of ischemia, no ST changes and no CP;  Rx w/ ASA, Statin, not cathed;  Pt did not f/u w/ Cards;  Old EKGs in 2013-14 showed SBrady, rate ~48 1st degree AVB & LAD;  In 2015 EKGs showed an intermittent RBBB + LAD ..Marland KitchenMarland KitchenHe denies CP, palpit, dizzy, syncope, edema, etc => referred to Cards for eval + Echo, etc...     Hyperlipidemia>  On Simva40 + fish Oil    DM2>  On Metformin500Bid    Hypothyroid>  On Synthroid100     BPH w/ BOO>  Seen by DrTannenbam 2013- cysto showed prostatomegaly otherw neg & treated w/ "green light" laser resection    RLS>  On Requip 316mQhs    Remote hx non-Hodgkin's Lymphoma ~1990s> no details avail; he had inguinal adenopathy, [place], [place];  Then in 07/2015 he developed posterior left neck adenopathy- [facility]CP [facility]=> NEG for malignancy w/ lymphoid hyperplasia & plasmacytosis, no granuloma, no microrganisms, flow cytometry neg w/o monoclonal B-cells or abn T-cell phenotypes, no evid of a lymphoproliferative process; the nodes resolve w/ Rx (Antibiotics and Pred)- no recurrence to date...     Hx mult skin cancers> squamous cell lesions and basal cell lesions removed by Derm  PLAN>>   02/06/16>   We will arrange for a sleep lab PSG to evaluate him for OSA, check oxygenation, & recheck his PLMD;  His more urgent need however is for Cariology follow up in light of his new  onset AFib & hx of intermittent RBBB/ LAD/ 1st degree AVB...Marland KitchenMarland Kitchenn the interim we will check CXR, Labs, 2DEcho and plan Spirometry & ambulatory oximitry on ret...  03/12/16>   Sleep study is pending & sched for 8/28;  He has Afib, RBBB, cardiomyopathy w/ EF 40-45% w/ some hypokinesis, & prev abn myoview in 2014=> eval by [facility] and he's proved INTOL to BBlockers, they request off the ToprolXL & I will switch him to CaDedhamoday although Cards may prefer ACE/ARB since he has the chr sys CHF & no real issue w/ rate control (so far); we plan f/u after the sleep study is read...     Plan:     Patient's Medications  New Prescriptions   DILTIAZEM (CARDIZEM CD) 120 MG 24 HR CAPSULE    Take 1 capsule (120 mg total) by mouth daily.  Previous Medications   APIXABAN (ELIQUIS) 5 MG TABS TABLET    Take 1 tablet (5 mg total) by mouth 2 (two) times daily.   ERGOCALCIFEROL (VITAMIN D2) 50000 UNITS CAPSULE    Take 50,000 Units by mouth once a week.   FISH OIL-OMEGA-3 FATTY ACIDS 1000 MG CAPSULE    Take 2 g by mouth daily.   FLUTICASONE (FLONASE) 50 MCG/ACT NASAL SPRAY    Place 2 sprays into both nostrils daily.   GLUCOSAMINE-CHONDROITIN 500-400 MG TABLET    Take 1 tablet by mouth daily.   LEVOTHYROXINE (SYNTHROID, LEVOTHROID) 100 MCG TABLET    Take 100 mcg by mouth daily.   METHYLSULFONYLMETHANE (MSM) 500 MG CAPS    Take 1 capsule by mouth daily.   MULTIPLE VITAMIN (MULTIVITAMIN WITH MINERALS) TABS TABLET    Take 1 tablet by mouth daily. Reported on 08/06/2015   ROPINIROLE (REQUIP) 3 MG TABLET    Take 5 mg by mouth at bedtime.   Modified Medications   No medications on file  Discontinued Medications   METOPROLOL SUCCINATE (TOPROL XL) 25 MG 24 HR TABLET    Take 1 tablet (25 mg total) by mouth at bedtime. May start out with 1/2 tablet (12.83m) and advance as tolerated to 1 tablet (229m

## 2016-03-12 NOTE — Patient Instructions (Signed)
We decided to change the Beta Blocker to a calcium channel blocker for his "rate control"...    Stop the TOPROL    Start the new CARDIZEM-CD 120mg  one tab daily...  Keep the appt for the SLEEP STUDY 8/208/17...    I will call you w/ the result just as soon as I get the interpretation from our sleep specialists...  Remember:  NO SALT (sodium), no sweets, work on weight reduction, and increase EXERCISE...  Call for any questions...  Let's plan a follow up visit in 4-6weeks, sooner if needed for problems.Marland KitchenMarland Kitchen

## 2016-03-19 ENCOUNTER — Ambulatory Visit (HOSPITAL_COMMUNITY): Payer: Medicare Other | Admitting: Nurse Practitioner

## 2016-03-23 ENCOUNTER — Ambulatory Visit (HOSPITAL_BASED_OUTPATIENT_CLINIC_OR_DEPARTMENT_OTHER): Payer: Medicare Other | Attending: Pulmonary Disease | Admitting: Pulmonary Disease

## 2016-03-23 ENCOUNTER — Other Ambulatory Visit (HOSPITAL_COMMUNITY): Payer: Medicare Other

## 2016-03-23 VITALS — Ht 70.0 in | Wt 212.0 lb

## 2016-03-23 DIAGNOSIS — Z79899 Other long term (current) drug therapy: Secondary | ICD-10-CM | POA: Insufficient documentation

## 2016-03-23 DIAGNOSIS — R0683 Snoring: Secondary | ICD-10-CM | POA: Diagnosis not present

## 2016-03-23 DIAGNOSIS — G4733 Obstructive sleep apnea (adult) (pediatric): Secondary | ICD-10-CM

## 2016-03-23 DIAGNOSIS — G4761 Periodic limb movement disorder: Secondary | ICD-10-CM

## 2016-03-23 DIAGNOSIS — I4891 Unspecified atrial fibrillation: Secondary | ICD-10-CM | POA: Diagnosis not present

## 2016-03-26 ENCOUNTER — Encounter (HOSPITAL_COMMUNITY): Payer: Self-pay | Admitting: Nurse Practitioner

## 2016-03-26 ENCOUNTER — Ambulatory Visit (HOSPITAL_COMMUNITY)
Admission: RE | Admit: 2016-03-26 | Discharge: 2016-03-26 | Disposition: A | Discharge status: Home / Self Care | Payer: Medicare Other | Source: Ambulatory Visit | Attending: Nurse Practitioner | Admitting: Nurse Practitioner

## 2016-03-26 ENCOUNTER — Telehealth: Payer: Self-pay

## 2016-03-26 VITALS — BP 110/64 | HR 62 | Ht 70.0 in | Wt 218.8 lb

## 2016-03-26 DIAGNOSIS — Z79899 Other long term (current) drug therapy: Secondary | ICD-10-CM | POA: Insufficient documentation

## 2016-03-26 DIAGNOSIS — I4891 Unspecified atrial fibrillation: Secondary | ICD-10-CM | POA: Diagnosis not present

## 2016-03-26 DIAGNOSIS — G4733 Obstructive sleep apnea (adult) (pediatric): Secondary | ICD-10-CM | POA: Diagnosis not present

## 2016-03-26 DIAGNOSIS — E039 Hypothyroidism, unspecified: Secondary | ICD-10-CM | POA: Insufficient documentation

## 2016-03-26 DIAGNOSIS — E119 Type 2 diabetes mellitus without complications: Secondary | ICD-10-CM | POA: Diagnosis not present

## 2016-03-26 DIAGNOSIS — Z8546 Personal history of malignant neoplasm of prostate: Secondary | ICD-10-CM | POA: Diagnosis not present

## 2016-03-26 DIAGNOSIS — M1990 Unspecified osteoarthritis, unspecified site: Secondary | ICD-10-CM | POA: Diagnosis not present

## 2016-03-26 DIAGNOSIS — Z823 Family history of stroke: Secondary | ICD-10-CM | POA: Insufficient documentation

## 2016-03-26 DIAGNOSIS — Z87891 Personal history of nicotine dependence: Secondary | ICD-10-CM | POA: Insufficient documentation

## 2016-03-26 DIAGNOSIS — I480 Paroxysmal atrial fibrillation: Secondary | ICD-10-CM | POA: Diagnosis not present

## 2016-03-26 DIAGNOSIS — R0683 Snoring: Secondary | ICD-10-CM

## 2016-03-26 DIAGNOSIS — Z7901 Long term (current) use of anticoagulants: Secondary | ICD-10-CM | POA: Diagnosis not present

## 2016-03-26 DIAGNOSIS — E785 Hyperlipidemia, unspecified: Secondary | ICD-10-CM | POA: Insufficient documentation

## 2016-03-26 DIAGNOSIS — Z8249 Family history of ischemic heart disease and other diseases of the circulatory system: Secondary | ICD-10-CM | POA: Insufficient documentation

## 2016-03-26 DIAGNOSIS — Z8572 Personal history of non-Hodgkin lymphomas: Secondary | ICD-10-CM | POA: Insufficient documentation

## 2016-03-26 DIAGNOSIS — G4761 Periodic limb movement disorder: Secondary | ICD-10-CM | POA: Insufficient documentation

## 2016-03-26 NOTE — Telephone Encounter (Signed)
He has moderate sleep apnea.  He needs to have f/u with Dr. Lenna Gilford or NP to review results and discuss tx plan.

## 2016-03-26 NOTE — Telephone Encounter (Signed)
Spoke with pt's son again. He is not happy that he needs to schedule an appointment to go over sleep study results. He wants something done ASAP about this. [name] was not rude but was very matter of fact with me. States that his dad is not sleeping and is walking around like a zombie. He would like to speak to SN over the phone. States that if we can't do something soon he is going to go to another doctor who will.  SN - please advise. Thanks.

## 2016-03-26 NOTE — Telephone Encounter (Signed)
Per SN---  SN called and spoke with pts son.  They discussed referral to DME with good customer service and reputation.  Will send in the order to [facility]>    Will need ASAP> resmed S10 Air/auto With heated humidity and climate controlled tubing Set auto 5-15 Enroll in [employer] of choice, and will need carefull fitting since he is 89  Pt will need an appt with SN in 4-6 wks after starting on the cpap for follow up.

## 2016-03-26 NOTE — Telephone Encounter (Signed)
Spoke with pt's son, [name]. He would like the results from pt's sleep study that was on 03/23/2016.  VS - please advise. Thanks.

## 2016-03-26 NOTE — Telephone Encounter (Signed)
Spoke with pt's son, [name]. He is aware of results. Attempted to schedule an appointment with SN or TP. He states that he will call back to make this appointment.

## 2016-03-26 NOTE — Progress Notes (Signed)
Patient ID: Travis Bradford, male   DOB: [birth year], 82 y.o.   MRN: [id number]     Primary Care Physician: [place] Referring [place] Cardiologist: Dr. Magda Paganini [name] is a 82 y.o. male with a h/o DM, HLD, BPH, prostate CA, remote lymphoma 1990s, arthritis, sinus bradycardia (prior sinus HR 48-70s), intermittent RBBB in afib clinic for eval of new onset aifb. This was present at an exam with his pulmonologist 7/26. Of note he does snore and is pending a sleep study, he has had fatigue for years. He was seen by Sharrell Ku 7/26 and was started on low dose BB(brady in past) and Eliquis for chadsvasc score of 4.  EKG reveals afib at 93 bpm, improvement of v rate of 122 bpm on previous visit. Despite better rate control, his son believes that he is more fatigued since starting meds and he is sleeping more. He also feels that he is more winded with activities. He does not drink alcohol, 3 cups of coffee a day but he has switched to decaf, no tobacco. He does snore with witnessed apnea epiosdoes and sleep study is within the next two weeks.  For f/u 8/31,The son was concerned that he tended to be sleeping more since afib dx'ed and thought it was the BB. Lopressor was changed to metoprolol ER and he was told to take 1/2 half a tab at bedtime. This did help somewhat with the fatigue but pt is falling asleep in the office today, was found to have moderate obstructive sleep apnea, just a few nights ago, and is pending starting cpap. I believe this is a large part of his daytime somnolence. His Pulmonologist recently changed him to Cardizem, pt is tolerating about the same as BB. In fact he stopped all meds( including eliquis) for a week and did not feel that much different. Son states that his HR stayed controlled. Sleep study did mention that he was in SR part of the night. He will need to be on UNINTERRUPTED DOAC x 3 weeks for cardioversion and this was stressed to the pt/son today. It would be  best if pt could be using  cpap prior to cardioversion.  Today, he denies symptoms of palpitations, chest pain, orthopnea, PND, lower extremity edema, dizziness, presyncope, syncope, or neurologic sequela. Positive for fatigue, pt is falling asleep in the office today. The patient is tolerating medications without difficulties and is otherwise without complaint today.   Past Medical History:  Diagnosis Date  . Abnormal stress test    a. 07/2012: abnormal stress echo showing hypokinesis of the mid-distal inferior wall suggestive of ischemia; + frequent PVCs, no ST changes or CP. Dr. [name] had a long discussion with the patient about further w/u versus medical therapy and the patient elected medical therapy..  . Arthritis   . Atrial fibrillation ([place])    a. Dx 01/2016.  [name] BPH (benign prostatic hyperplasia)   . Diabetes mellitus, type 2 ([place])   . History of blood transfusion   . Hyperlipidemia   . Hypothyroid   . Lymphoma ([place])    15 years ago  . Pneumonia   . Prostate cancer ([place])   . PVC's (premature ventricular contractions)    a. 07/2012 - seen on stress echo.  [name] RBBB    a. h/o intermittent rbbb.  . Sinus bradycardia    Past Surgical History:  Procedure Laterality Date  . APPENDECTOMY    . Athroscopic knee surgery    . dental implants    .  GREEN LIGHT LASER TURP (TRANSURETHRAL RESECTION OF PROSTATE  08/29/2012   Procedure: GREEN LIGHT LASER TURP (TRANSURETHRAL RESECTION OF PROSTATE;  Surgeon: [name], MD;  Location: [facility];  Service: Urology;  Laterality: N/A;  . MOHS SURGERY     x 6    Current Outpatient Prescriptions  Medication Sig Dispense Refill  . apixaban (ELIQUIS) 5 MG TABS tablet Take 1 tablet (5 mg total) by mouth 2 (two) times daily. 180 tablet 3  . diltiazem (CARDIZEM CD) 120 MG 24 hr capsule Take 1 capsule (120 mg total) by mouth daily. 30 capsule 6  . ergocalciferol (VITAMIN D2) 50000 UNITS capsule Take 50,000 Units by mouth once a week.    . fish  oil-omega-3 fatty acids 1000 MG capsule Take 2 g by mouth daily.    . fluticasone (FLONASE) 50 MCG/ACT nasal spray Place 2 sprays into both nostrils daily. 16 g 10  . glucosamine-chondroitin 500-400 MG tablet Take 1 tablet by mouth daily.    [name] levothyroxine (SYNTHROID, LEVOTHROID) 100 MCG tablet Take 100 mcg by mouth daily.    . Methylsulfonylmethane (MSM) 500 MG CAPS Take 1 capsule by mouth daily.    . Multiple Vitamin (MULTIVITAMIN WITH MINERALS) TABS tablet Take 1 tablet by mouth daily. Reported on 08/06/2015    . rOPINIRole (REQUIP) 3 MG tablet Take 5 mg by mouth at bedtime.      No current facility-administered medications for this encounter.     No Known Allergies  Social History   Social History  . Marital status: Widowed    Spouse name: N/A  . Number of children: 2  . Years of education: N/A   Occupational History  . Not on file.   Social History Main Topics  . Smoking status: Former Research scientist (life sciences)  . Smokeless tobacco: Never Used  . Alcohol use Yes     Comment: Rarely  . Drug use: Unknown  . Sexual activity: Not on file   Other Topics Concern  . Not on file   Social History Narrative  . No narrative on file    Family History  Problem Relation Age of Onset  . CAD Mother   . Stroke Brother      X 2    ROS- All systems are reviewed and negative except as per the HPI above  Physical Exam: Vitals:   03/26/16 1135  BP: 110/64  Pulse: 62  Weight: 218 lb 12.8 oz (99.2 kg)  Height: 5\' 10"  (1.778 m)    GEN- The patient is well appearing, alert and oriented x 3 today.   Head- normocephalic, atraumatic Eyes-  Sclera clear, conjunctiva pink Ears- hearing intact Oropharynx- clear Neck- supple, no JVP Lymph- no cervical lymphadenopathy Lungs- Clear to ausculation bilaterally, normal work of breathing Heart- irregular rate and rhythm, no murmurs, rubs or gallops, PMI not laterally displaced GI- soft, NT, ND, + BS Extremities- no clubbing, cyanosis, or edema MS- no  significant deformity or atrophy Skin- no rash or lesion Psych- euthymic mood, full affect Neuro- strength and sensation are intact  EKG-afib at 62 bpm, LAD, qrs int 104 ms, qtc 422 ms   Echo- 7/17- - Left ventricle: The cavity size was normal. Wall thickness was   normal. Systolic function was mildly to moderately reduced. The   estimated ejection fraction was in the range of 40% to 45%.   Hypokinesis of the basal-midinferolateral and inferior   myocardium. - Mitral valve: Calcified annulus. There was mild regurgitation. - Left atrium: The  atrium was mildly dilated. - Right ventricle: The cavity size was mildly dilated. - Right atrium: The atrium was mildly dilated.  Labs, creatinine 0.93, K+ 4.3, cbc unremarkable, TSH 3.85, BNP 2.19  Assessment and Plan: 1. New onset afib Rate controlled with cardizem Continue eliquis, do not miss any doses, bleeding precautions discussed(pt recently stopped x one week)  2. Probable sleep apnea with witnessed snoring and apnea Positive for moderate obstructive sleep apnea Son to find out plans for cpap Would like to have sleep apnea treated prior to cardioversion  F/u in afib clinic in 4 weeks for review sleep study as well as to possibly schedule DCCV  Butch Penny C. Tadeusz Stahl, [facility] [address] [place], [place] [address] [phone]

## 2016-03-26 NOTE — Procedures (Signed)
    Patient Name: Travis Bradford, Travis Bradford Date: 03/23/2016 Gender: Male D.O.B: 07/10/1926 Age (years): 89 Referring Provider: Teressa Lower Height (inches): 70 Interpreting Physician: Chesley Mires MD, ABSM Weight (lbs): 212 RPSGT: Earney Hamburg BMI: 30 MRN: [id number] Neck Size: 17.50  CLINICAL INFORMATION Sleep Study Type: NPSG Indication for sleep study: Snoring Epworth Sleepiness Score: 6  SLEEP STUDY TECHNIQUE As per the AASM Manual for the Scoring of Sleep and Associated Events v2.3 (April 2016) with a hypopnea requiring 4% desaturations. The channels recorded and monitored were frontal, central and occipital EEG, electrooculogram (EOG), submentalis EMG (chin), nasal and oral airflow, thoracic and abdominal wall motion, anterior tibialis EMG, snore microphone, electrocardiogram, and pulse oximetry.  MEDICATIONS Patient's medications include: REQUIP. Medications self-administered by patient during sleep study : No sleep medicine administered.  SLEEP ARCHITECTURE The study was initiated at 10:20:43 PM and ended at 4:31:20 AM. Sleep onset time was 0.0 minutes and the sleep efficiency was 66.3%. The total sleep time was 245.9 minutes. Stage REM latency was 232.3 minutes. The patient spent 8.14% of the night in stage N1 sleep, 81.49% in stage N2 sleep, 0.00% in stage N3 and 10.37% in REM. Alpha intrusion was absent. Supine sleep was 18.58%.  RESPIRATORY PARAMETERS The overall apnea/hypopnea index (AHI) was 15.4 per hour. There were 27 total apneas, including 19 obstructive, 7 central and 1 mixed apneas. There were 36 hypopneas and 15 RERAs. The AHI during Stage REM sleep was 0.0 per hour. AHI while supine was 23.6 per hour. The mean oxygen saturation was 93.17%. The minimum SpO2 during sleep was 89.0%. Loud snoring was noted during this study.  CARDIAC DATA The 2 lead EKG demonstrated sinus rhythm. The mean heart rate was 77.21 beats per minute. Other EKG findings  include: Atrial Fibrillation.  LEG MOVEMENT DATA The total PLMS were 211 with a resulting PLMS index of 51.49. Associated arousal with leg movement index was 16.4 .  IMPRESSIONS - This study shows moderate obstructive sleep apnea with an AHI of 15.4 and SpO2 low of  89%. - Rhythm showed episodes of atrial fibrillation.  He has history of atrial fibrillation. - Increase in periodic limb movement index.  He has history of restless leg syndrome and is on therapy for this.  DIAGNOSIS - Obstructive Sleep Apnea (327.23 [G47.33 ICD-10]) - Periodic Limb Movements of Sleep (327.51 [G47.61 ICD 10])  RECOMMENDATIONS - Additional therapies for sleep apnea include weight loss, CPAP, oral appliance, or surgical assessment. - He should continue his therapy for restless leg syndrome.  [Electronically signed] 03/26/2016 11:05 AM  Chesley Mires MD, ABSM Diplomate, American Board of Sleep Medicine   NPI: [id number]

## 2016-03-31 ENCOUNTER — Other Ambulatory Visit: Payer: Self-pay

## 2016-03-31 NOTE — Telephone Encounter (Signed)
Referral for cpap has already been placed.  No office visit is yet scheduled.   Spoke with pt's son, aware of recs.  Will call to schedule ROV after receiving cpap.  Nothing further needed.

## 2016-04-17 ENCOUNTER — Telehealth: Payer: Self-pay | Admitting: Pulmonary Disease

## 2016-04-17 NOTE — Telephone Encounter (Signed)
Noted by triage Did try to call son to verify that everything was taken care of > left detailed message that if everything was NOT taken care of and pt's CPAP has not been set up, to please call the office.  Will sign off

## 2016-04-17 NOTE — Telephone Encounter (Signed)
Patient son called and is wanting help to get set up on cpap - [facility] has not contacted him about his dad's cpap. Son [name] can be reached at [phone]

## 2016-04-17 NOTE — Telephone Encounter (Signed)
Called spoke with Maudie Mercury w/ [facility].  Reports the info they received pt lived in [place].  Then she spoke with pt son and was told pt did in fact live in [place]. Maudie Mercury reports they only do set ups on Tuesday/fridays. She is going to call son to offer pt set up of CPAP for next week.  LMTCB for pt son at # provided below.

## 2016-04-17 NOTE — Telephone Encounter (Signed)
Patient son called and states that he now has everything set up for his dad. Nothing else is needed -pr

## 2016-04-20 DIAGNOSIS — L821 Other seborrheic keratosis: Secondary | ICD-10-CM | POA: Diagnosis not present

## 2016-04-20 DIAGNOSIS — L570 Actinic keratosis: Secondary | ICD-10-CM | POA: Diagnosis not present

## 2016-04-20 DIAGNOSIS — Z85828 Personal history of other malignant neoplasm of skin: Secondary | ICD-10-CM | POA: Diagnosis not present

## 2016-04-20 DIAGNOSIS — L814 Other melanin hyperpigmentation: Secondary | ICD-10-CM | POA: Diagnosis not present

## 2016-04-20 DIAGNOSIS — C4441 Basal cell carcinoma of skin of scalp and neck: Secondary | ICD-10-CM | POA: Diagnosis not present

## 2016-04-20 DIAGNOSIS — D225 Melanocytic nevi of trunk: Secondary | ICD-10-CM | POA: Diagnosis not present

## 2016-04-23 ENCOUNTER — Ambulatory Visit (HOSPITAL_COMMUNITY)
Admission: RE | Admit: 2016-04-23 | Discharge: 2016-04-23 | Disposition: A | Discharge status: Home / Self Care | Payer: Medicare Other | Source: Ambulatory Visit | Attending: Nurse Practitioner | Admitting: Nurse Practitioner

## 2016-04-23 ENCOUNTER — Encounter (HOSPITAL_COMMUNITY): Payer: Self-pay | Admitting: Nurse Practitioner

## 2016-04-23 VITALS — BP 120/64 | HR 79 | Ht 70.0 in | Wt 220.0 lb

## 2016-04-23 DIAGNOSIS — I4819 Other persistent atrial fibrillation: Secondary | ICD-10-CM

## 2016-04-23 DIAGNOSIS — I4891 Unspecified atrial fibrillation: Secondary | ICD-10-CM | POA: Diagnosis not present

## 2016-04-23 DIAGNOSIS — I481 Persistent atrial fibrillation: Secondary | ICD-10-CM

## 2016-04-23 NOTE — Progress Notes (Signed)
Patient ID: Travis Bradford, male   DOB: [birth year], 82 y.o.   MRN: [id number]     Primary Care Physician: [place] Referring [place] Cardiologist: Dr. Magda Paganini Bradford is a 82 y.o. male, seen in [place] clinic 8/4, with a h/o DM, HLD, BPH, prostate CA, remote lymphoma 1990s, arthritis, sinus bradycardia (prior sinus HR 48-70s), intermittent RBBB in afib clinic for eval of new onset aifb. This was present at an exam with his pulmonologist 7/26. Of note he does snore and is pending a sleep study, he has had fatigue for years. He was seen by Sharrell Ku 7/26 and was started on low dose BB(brady in past) and Eliquis for chadsvasc score of 4.  EKG reveals afib at 93 bpm, improvement of v rate of 122 bpm on previous visit. Despite better rate control, his son believes that he is more fatigued since starting meds and he is sleeping more. He also feels that he is more winded with activities. He does not drink alcohol, 3 cups of coffee a day but he has switched to decaf, no tobacco. He does snore with witnessed apnea epiosdoes and sleep study is within the next two weeks.  For f/u 8/31,The son was concerned that he tended to be sleeping more since afib dx'ed and thought it was the BB. Lopressor was changed to metoprolol ER and he was told to take 1/2 half a tab at bedtime. This did help somewhat with the fatigue but pt is falling asleep in the office today, was found to have moderate obstructive sleep apnea, just a few nights ago, and is pending starting cpap. I believe this is a large part of his daytime somnolence. His Pulmonologist recently changed him to Cardizem, pt is tolerating about the same as BB. In fact he stopped all meds( including eliquis) for a week and did not feel that much different. Son states that his HR stayed controlled. Sleep study did mention that he was in SR part of the night. He will need to be on UNINTERRUPTED DOAC x 3 weeks for cardioversion and this was stressed to the  pt/son today. It would be best if pt could be using  cpap prior to cardioversion.  F/u 9/28, son reports that his dad got cpap wore in x 20 mins and threw it back in the box and does not want to try further. He was encouraged to try to use for a while longer to see if he can get accustomed to it. He is rate controlled and has taken eliquis on a regular basis now for one month. Cardioversion offered but he has upcoming appointment with Dr. [name] is pending  10/13 and they would like to postpone until they talk to him.  Today, he denies symptoms of palpitations, chest pain, orthopnea, PND, lower extremity edema, dizziness, presyncope, syncope, or neurologic sequela. Positive for fatigue, pt is falling asleep in the office today. The patient is tolerating medications without difficulties and is otherwise without complaint today.   Past Medical History:  Diagnosis Date  . Abnormal stress test    a. 07/2012: abnormal stress echo showing hypokinesis of the mid-distal inferior wall suggestive of ischemia; + frequent PVCs, no ST changes or CP. Dr. [name] had a long discussion with the patient about further w/u versus medical therapy and the patient elected medical therapy..  . Arthritis   . Atrial fibrillation ([place])    a. Dx 01/2016.  Travis Bradford BPH (benign prostatic hyperplasia)   . Diabetes mellitus, type  2 (Travis Bradford)   . History of blood transfusion   . Hyperlipidemia   . Hypothyroid   . Lymphoma (Travis Bradford)    15 years ago  . Pneumonia   . Prostate cancer (Travis Bradford)   . PVC's (premature ventricular contractions)    a. 07/2012 - seen on stress echo.  Travis Bradford RBBB    a. h/o intermittent rbbb.  . Sinus bradycardia    Past Surgical History:  Procedure Laterality Date  . APPENDECTOMY    . Athroscopic knee surgery    . dental implants    . GREEN LIGHT LASER TURP (TRANSURETHRAL RESECTION OF PROSTATE  08/29/2012   Procedure: GREEN LIGHT LASER TURP (TRANSURETHRAL RESECTION OF PROSTATE;  Surgeon: [name], MD;   Location: [facility];  Service: Urology;  Laterality: N/A;  . MOHS SURGERY     x 6    Current Outpatient Prescriptions  Medication Sig Dispense Refill  . apixaban (ELIQUIS) 5 MG TABS tablet Take 1 tablet (5 mg total) by mouth 2 (two) times daily. 180 tablet 3  . diltiazem (CARDIZEM CD) 120 MG 24 hr capsule Take 1 capsule (120 mg total) by mouth daily. 30 capsule 6  . ergocalciferol (VITAMIN D2) 50000 UNITS capsule Take 50,000 Units by mouth once a week.    . fish oil-omega-3 fatty acids 1000 MG capsule Take 2 g by mouth daily.    . fluticasone (FLONASE) 50 MCG/ACT nasal spray Place 2 sprays into both nostrils daily. 16 g 10  . glucosamine-chondroitin 500-400 MG tablet Take 1 tablet by mouth daily.    Travis Bradford levothyroxine (SYNTHROID, LEVOTHROID) 100 MCG tablet Take 100 mcg by mouth daily.    . Methylsulfonylmethane (MSM) 500 MG CAPS Take 1 capsule by mouth daily.    . Multiple Vitamin (MULTIVITAMIN WITH MINERALS) TABS tablet Take 1 tablet by mouth daily. Reported on 08/06/2015    . rOPINIRole (REQUIP) 3 MG tablet Take 5 mg by mouth at bedtime.     . simvastatin (ZOCOR) 40 MG tablet Take 40 mg by mouth daily.     No current facility-administered medications for this encounter.     No Known Allergies  Social History   Social History  . Marital status: Widowed    Spouse name: N/A  . Number of children: 2  . Years of education: N/A   Occupational History  . Not on file.   Social History Main Topics  . Smoking status: Former Research scientist (life sciences)  . Smokeless tobacco: Never Used  . Alcohol use Yes     Comment: Rarely  . Drug use: Unknown  . Sexual activity: Not on file   Other Topics Concern  . Not on file   Social History Narrative  . No narrative on file    Family History  Problem Relation Age of Onset  . CAD Mother   . Stroke Brother      X 2    ROS- All systems are reviewed and negative except as per the HPI above  Physical Exam: Vitals:   04/23/16 1140  BP: 120/64  Pulse: 79    Weight: 220 lb (99.8 kg)  Height: 5\' 10"  (1.778 m)    GEN- The patient is well appearing, alert and oriented x 3 today.   Head- normocephalic, atraumatic Eyes-  Sclera clear, conjunctiva pink Ears- hearing intact Oropharynx- clear Neck- supple, no JVP Lymph- no cervical lymphadenopathy Lungs- Clear to ausculation bilaterally, normal work of breathing Heart- irregular rate and rhythm, no murmurs, rubs or gallops, PMI not  laterally displaced GI- soft, NT, ND, + BS Extremities- no clubbing, cyanosis, or edema MS- no significant deformity or atrophy Skin- no rash or lesion Psych- euthymic mood, full affect Neuro- strength and sensation are intact  EKG-afib at 79 bpm, LAD, qrs int 100 ms, qtc 451 ms   Echo- 7/17- - Left ventricle: The cavity size was normal. Wall thickness was   normal. Systolic function was mildly to moderately reduced. The   estimated ejection fraction was in the range of 40% to 45%.   Hypokinesis of the basal-midinferolateral and inferior   myocardium. - Mitral valve: Calcified annulus. There was mild regurgitation. - Left atrium: The atrium was mildly dilated. - Right ventricle: The cavity size was mildly dilated. - Right atrium: The atrium was mildly dilated.  Labs, creatinine 0.93, K+ 4.3, cbc unremarkable, TSH 3.85, BNP 2.19  Assessment and Plan: 1. Persistent afib Rate controlled with cardizem Continue eliquis, do not miss any doses with pending cardioversion  2. OSA with witnessed snoring and apnea Positive for moderate obstructive sleep apnea Has cpap, tried for a few minutes, states can't tolerate Encouraged to try to use several more days before deciding he can't use it   Would help  to keep in rhythm if  sleep apnea treated   F/u with Dr. [name] 10/13 afib as needed  Geroge Baseman. Elvert Cumpton, [facility] [address] [place], [place] [address] [phone]

## 2016-05-06 NOTE — Progress Notes (Signed)
HPI: Follow-up atrial fibrillation. Stress echocardiogram January 2014 showed hypokinesis of the mid and distal inferior wall suggestive of ischemia. We treated medically. Patient had a CT of his neck in January 2017 that showed calcium in both the carotids in coronaries. Echocardiogram July 2017 showed ejection fraction 40-45% with hypokinesis of the basal and mid inferior lateral and inferior myocardium. Mild biatrial enlargement, mild right ventricular enlargement and mild mitral regurgitation. TSH July 2017 3.85. Patient seen for new onset atrial fibrillation in July 2017. Patient placed on metoprolol and anticoagulation. Metoprolol subsequent change to Cardizem because of fatigue. Plan was to proceed with cardioversion once anticoagulated for 3 consecutive weeks. Since last seen he denies dyspnea or chest pain. No palpitations or bleeding.  Current Outpatient Prescriptions  Medication Sig Dispense Refill  . apixaban (ELIQUIS) 5 MG TABS tablet Take 1 tablet (5 mg total) by mouth 2 (two) times daily. 180 tablet 3  . diltiazem (CARDIZEM CD) 120 MG 24 hr capsule Take 1 capsule (120 mg total) by mouth daily. 30 capsule 6  . ergocalciferol (VITAMIN D2) 50000 UNITS capsule Take 50,000 Units by mouth once a week.    . fish oil-omega-3 fatty acids 1000 MG capsule Take 2 g by mouth daily.    . fluticasone (FLONASE) 50 MCG/ACT nasal spray Place 2 sprays into both nostrils daily. 16 g 10  . glucosamine-chondroitin 500-400 MG tablet Take 1 tablet by mouth daily.    [name] levothyroxine (SYNTHROID, LEVOTHROID) 100 MCG tablet Take 100 mcg by mouth daily.    . Methylsulfonylmethane (MSM) 500 MG CAPS Take 1 capsule by mouth daily.    . Multiple Vitamin (MULTIVITAMIN WITH MINERALS) TABS tablet Take 1 tablet by mouth daily. Reported on 08/06/2015    . rOPINIRole (REQUIP) 3 MG tablet Take 5 mg by mouth at bedtime.      No current facility-administered medications for this visit.      Past Medical History:    Diagnosis Date  . Abnormal stress test    a. 07/2012: abnormal stress echo showing hypokinesis of the mid-distal inferior wall suggestive of ischemia; + frequent PVCs, no ST changes or CP. Dr. [name] had a long discussion with the patient about further w/u versus medical therapy and the patient elected medical therapy..  . Arthritis   . Atrial fibrillation ([place])    a. Dx 01/2016.  [name] BPH (benign prostatic hyperplasia)   . Diabetes mellitus, type 2 ([place])   . History of blood transfusion   . Hyperlipidemia   . Hypothyroid   . Lymphoma ([place])    15 years ago  . Pneumonia   . Prostate cancer ([place])   . PVC's (premature ventricular contractions)    a. 07/2012 - seen on stress echo.  [name] RBBB    a. h/o intermittent rbbb.  . Sinus bradycardia     Past Surgical History:  Procedure Laterality Date  . APPENDECTOMY    . Athroscopic knee surgery    . dental implants    . GREEN LIGHT LASER TURP (TRANSURETHRAL RESECTION OF PROSTATE  08/29/2012   Procedure: GREEN LIGHT LASER TURP (TRANSURETHRAL RESECTION OF PROSTATE;  Surgeon: [name], MD;  Location: [facility];  Service: Urology;  Laterality: N/A;  . MOHS SURGERY     x 6    Social History   Social History  . Marital status: Widowed    Spouse name: N/A  . Number of children: 2  . Years of education: N/A   Occupational  History  . Not on file.   Social History Main Topics  . Smoking status: Former Research scientist (life sciences)  . Smokeless tobacco: Never Used  . Alcohol use Yes     Comment: Rarely  . Drug use: Unknown  . Sexual activity: Not on file   Other Topics Concern  . Not on file   Social History Narrative  . No narrative on file    Family History  Problem Relation Age of Onset  . CAD Mother   . Stroke Brother      X 2    ROS: no fevers or chills, productive cough, hemoptysis, dysphasia, odynophagia, melena, hematochezia, dysuria, hematuria, rash, seizure activity, orthopnea, PND, pedal edema, claudication. Remaining systems are  negative.  Physical Exam: Well-developed well-nourished in no acute distress.  Skin is warm and dry.  HEENT is normal.  Neck is supple.  Chest is clear to auscultation with normal expansion.  Cardiovascular exam is irregular Abdominal exam nontender or distended. No masses palpated. Extremities show no edema. neuro grossly intact  ECG-atrial fibrillation at a rate of 84. Left axis deviation. Cannot rule out prior septal infarct. Left ventricular hypertrophy.  A/P  1 hyperlipidemia-continue statin.  2 paroxysmal atrial fibrillation-patient remains in atrial fibrillation today. Laboratories show creatinine 1.04 and hemoglobin 12.4. Continue apixaban. Continue Cardizem for rate control. Long discussion today concerning rhythm control versus rate control. He has some fatigue but this has been a slow progressive process over the last several years. I'm not convinced atrial fibrillation is contributing although it could be. We have elected to continue with rate control for now. I will see him back in 3 months and we will consider cardioversion at that point if he is symptomatically worse.  3 cardiomyopathy-LV function mild to moderately decreased. Question tachycardia mediated. We will plan to repeat echocardiogram in the future. He did not tolerate beta blockade because of increased fatigue.  4 previous abnormal stress test-no chest pain. Plan medical therapy for now.  5 history of bradycardia-we will need to follow closely if we cardioverted the patient as he would be at risk for tachybradycardia syndrome.  [name], MD

## 2016-05-08 ENCOUNTER — Ambulatory Visit (INDEPENDENT_AMBULATORY_CARE_PROVIDER_SITE_OTHER): Payer: Medicare Other | Admitting: Cardiology

## 2016-05-08 ENCOUNTER — Encounter: Payer: Self-pay | Admitting: Cardiology

## 2016-05-08 VITALS — BP 114/62 | HR 84 | Ht 70.0 in | Wt 221.0 lb

## 2016-05-08 DIAGNOSIS — I4891 Unspecified atrial fibrillation: Secondary | ICD-10-CM

## 2016-05-08 DIAGNOSIS — I480 Paroxysmal atrial fibrillation: Secondary | ICD-10-CM

## 2016-05-08 NOTE — Patient Instructions (Signed)
Your physician recommends that you schedule a follow-up appointment in: 3 MONTHS WITH DR CRENSHAW  

## 2016-07-08 DIAGNOSIS — H35033 Hypertensive retinopathy, bilateral: Secondary | ICD-10-CM | POA: Diagnosis not present

## 2016-07-08 DIAGNOSIS — H353111 Nonexudative age-related macular degeneration, right eye, early dry stage: Secondary | ICD-10-CM | POA: Diagnosis not present

## 2016-07-08 DIAGNOSIS — I708 Atherosclerosis of other arteries: Secondary | ICD-10-CM | POA: Diagnosis not present

## 2016-07-08 DIAGNOSIS — H353121 Nonexudative age-related macular degeneration, left eye, early dry stage: Secondary | ICD-10-CM | POA: Diagnosis not present

## 2016-07-24 ENCOUNTER — Encounter: Payer: Self-pay | Admitting: Pulmonary Disease

## 2016-07-28 NOTE — Progress Notes (Signed)
HPI: Follow-up atrial fibrillation. Stress echocardiogram January 2014 showed hypokinesis of the mid and distal inferior wall suggestive of ischemia. We treated medically. Patient had a CT of his neck in January 2017 that showed calcium in both the carotids in coronaries. Echocardiogram July 2017 showed ejection fraction 40-45% with hypokinesis of the basal and mid inferior lateral and inferior myocardium. Mild biatrial enlargement, mild right ventricular enlargement and mild mitral regurgitation. TSH July 2017 3.85. Patient seen for new onset atrial fibrillation in July 2017. Patient placed on metoprolol and anticoagulation. Metoprolol subsequent change to Cardizem because of fatigue. Since last seen the patient has dyspnea with more extreme activities but not with routine activities. It is relieved with rest. It is not associated with chest pain. There is no orthopnea, PND or pedal edema. There is no syncope or palpitations. There is no exertional chest pain.   Current Outpatient Prescriptions  Medication Sig Dispense Refill  . apixaban (ELIQUIS) 5 MG TABS tablet Take 1 tablet (5 mg total) by mouth 2 (two) times daily. 180 tablet 3  . diltiazem (CARDIZEM CD) 120 MG 24 hr capsule Take 1 capsule (120 mg total) by mouth daily. 30 capsule 6  . ergocalciferol (VITAMIN D2) 50000 UNITS capsule Take 50,000 Units by mouth once a week.    . fish oil-omega-3 fatty acids 1000 MG capsule Take 2 g by mouth daily.    . fluticasone (FLONASE) 50 MCG/ACT nasal spray Place 2 sprays into both nostrils daily. 16 g 10  . glucosamine-chondroitin 500-400 MG tablet Take 1 tablet by mouth daily.    [name] levothyroxine (SYNTHROID, LEVOTHROID) 100 MCG tablet Take 100 mcg by mouth daily.    . Methylsulfonylmethane (MSM) 500 MG CAPS Take 1 capsule by mouth daily.    . Multiple Vitamin (MULTIVITAMIN WITH MINERALS) TABS tablet Take 1 tablet by mouth daily. Reported on 08/06/2015    . rOPINIRole (REQUIP) 3 MG tablet Take 5 mg  by mouth at bedtime.      No current facility-administered medications for this visit.      Past Medical History:  Diagnosis Date  . Abnormal stress test    a. 07/2012: abnormal stress echo showing hypokinesis of the mid-distal inferior wall suggestive of ischemia; + frequent PVCs, no ST changes or CP. Dr. [name] had a long discussion with the patient about further w/u versus medical therapy and the patient elected medical therapy..  . Arthritis   . Atrial fibrillation ([place])    a. Dx 01/2016.  [name] BPH (benign prostatic hyperplasia)   . Diabetes mellitus, type 2 ([place])   . History of blood transfusion   . Hyperlipidemia   . Hypothyroid   . Lymphoma ([place])    15 years ago  . Pneumonia   . Prostate cancer ([place])   . PVC's (premature ventricular contractions)    a. 07/2012 - seen on stress echo.  [name] RBBB    a. h/o intermittent rbbb.  . Sinus bradycardia     Past Surgical History:  Procedure Laterality Date  . APPENDECTOMY    . Athroscopic knee surgery    . dental implants    . GREEN LIGHT LASER TURP (TRANSURETHRAL RESECTION OF PROSTATE  08/29/2012   Procedure: GREEN LIGHT LASER TURP (TRANSURETHRAL RESECTION OF PROSTATE;  Surgeon: [name], MD;  Location: [facility];  Service: Urology;  Laterality: N/A;  . MOHS SURGERY     x 6    Social History   Social History  . Marital  status: Widowed    Spouse name: N/A  . Number of children: 2  . Years of education: N/A   Occupational History  . Not on file.   Social History Main Topics  . Smoking status: Former Research scientist (life sciences)  . Smokeless tobacco: Never Used  . Alcohol use Yes     Comment: Rarely  . Drug use: Unknown  . Sexual activity: Not on file   Other Topics Concern  . Not on file   Social History Narrative  . No narrative on file    Family History  Problem Relation Age of Onset  . CAD Mother   . Stroke Brother      X 2    ROS: no fevers or chills, productive cough, hemoptysis, dysphasia, odynophagia, melena,  hematochezia, dysuria, hematuria, rash, seizure activity, orthopnea, PND, pedal edema, claudication. Remaining systems are negative.  Physical Exam: Well-developed well-nourished in no acute distress.  Skin is warm and dry.  HEENT is normal.  Neck is supple.  Chest is clear to auscultation with normal expansion.  Cardiovascular exam is irregular Abdominal exam nontender or distended. No masses palpated. Extremities show no edema. neuro grossly intact  ECG-atrial fibrillation at a rate of 58. Left anterior fascicular block. Right bundle branch block.  A/P  1 Atrial fibrillation-patient remains in atrial fibrillation. Continue anticoagulation. Continue Cardizem for rate control. I would favor rate control and anticoagulation as I do not think he is symptomatic.  2 hyperlipidemia-management per primary care.  3 history of mild cardiomyopathy-question tachycardia mediated. We will plan follow-up study in the future. He did not tolerate beta blockers in the past because of fatigue.  4 previous abnormal functional study-no chest pain. Will not pursue further evaluation at this point.  [name], MD

## 2016-07-30 DIAGNOSIS — Z85828 Personal history of other malignant neoplasm of skin: Secondary | ICD-10-CM | POA: Diagnosis not present

## 2016-07-30 DIAGNOSIS — L308 Other specified dermatitis: Secondary | ICD-10-CM | POA: Diagnosis not present

## 2016-08-04 ENCOUNTER — Ambulatory Visit (INDEPENDENT_AMBULATORY_CARE_PROVIDER_SITE_OTHER): Payer: Medicare Other | Admitting: Cardiology

## 2016-08-04 ENCOUNTER — Encounter: Payer: Self-pay | Admitting: Cardiology

## 2016-08-04 VITALS — BP 126/72 | HR 58 | Ht 70.0 in | Wt 209.0 lb

## 2016-08-04 DIAGNOSIS — R9439 Abnormal result of other cardiovascular function study: Secondary | ICD-10-CM | POA: Diagnosis not present

## 2016-08-04 DIAGNOSIS — E7800 Pure hypercholesterolemia, unspecified: Secondary | ICD-10-CM

## 2016-08-04 DIAGNOSIS — I4891 Unspecified atrial fibrillation: Secondary | ICD-10-CM

## 2016-08-04 NOTE — Patient Instructions (Signed)
Your physician wants you to follow-up in: 6 MONTHS WITH DR CRENSHAW You will receive a reminder letter in the mail two months in advance. If you don't receive a letter, please call our office to schedule the follow-up appointment.   If you need a refill on your cardiac medications before your next appointment, please call your pharmacy.  

## 2016-08-20 ENCOUNTER — Other Ambulatory Visit: Payer: Self-pay | Admitting: Orthopedic Surgery

## 2016-08-20 ENCOUNTER — Telehealth: Payer: Self-pay | Admitting: Cardiology

## 2016-08-20 ENCOUNTER — Ambulatory Visit
Admission: RE | Admit: 2016-08-20 | Discharge: 2016-08-20 | Disposition: A | Discharge status: Home / Self Care | Payer: Medicare Other | Source: Ambulatory Visit | Attending: Orthopedic Surgery | Admitting: Orthopedic Surgery

## 2016-08-20 DIAGNOSIS — M25571 Pain in right ankle and joints of right foot: Secondary | ICD-10-CM | POA: Diagnosis not present

## 2016-08-20 DIAGNOSIS — M25561 Pain in right knee: Secondary | ICD-10-CM | POA: Diagnosis not present

## 2016-08-20 DIAGNOSIS — M25551 Pain in right hip: Secondary | ICD-10-CM | POA: Diagnosis not present

## 2016-08-20 DIAGNOSIS — M898X5 Other specified disorders of bone, thigh: Secondary | ICD-10-CM

## 2016-08-20 DIAGNOSIS — T148XXA Other injury of unspecified body region, initial encounter: Secondary | ICD-10-CM | POA: Diagnosis not present

## 2016-08-20 NOTE — Telephone Encounter (Signed)
Dr [name] spoke with dr Berenice Primas, the patient has fallen and has a large hematoma. Okay given for patient to stop eliquis for one week and restart once dr graves feels it is safe.

## 2016-08-24 DIAGNOSIS — T148XXA Other injury of unspecified body region, initial encounter: Secondary | ICD-10-CM | POA: Diagnosis not present

## 2016-08-24 DIAGNOSIS — M25561 Pain in right knee: Secondary | ICD-10-CM | POA: Diagnosis not present

## 2016-08-24 DIAGNOSIS — M25551 Pain in right hip: Secondary | ICD-10-CM | POA: Diagnosis not present

## 2016-08-24 DIAGNOSIS — M25571 Pain in right ankle and joints of right foot: Secondary | ICD-10-CM | POA: Diagnosis not present

## 2016-09-03 ENCOUNTER — Telehealth: Payer: Self-pay | Admitting: Pulmonary Disease

## 2016-09-03 NOTE — Telephone Encounter (Signed)
Spoke with pt's son, [name]. He wanted to give un update on the pt. Pt tried and failed CPAP and was referred to Dr. Ron Parker for an oral appliance. He tried and failed oral appliance too. Now his OSA is going untreated. Pt recently fell, he didn't breaking anything but is bruised from hip to ankle. [name] does not want to make an appointment with SN right now, he wants to wait until the pt is feeling better. Nothing further was needed at this time.

## 2016-09-21 DIAGNOSIS — I872 Venous insufficiency (chronic) (peripheral): Secondary | ICD-10-CM | POA: Diagnosis not present

## 2016-09-21 DIAGNOSIS — I8312 Varicose veins of left lower extremity with inflammation: Secondary | ICD-10-CM | POA: Diagnosis not present

## 2016-09-21 DIAGNOSIS — Z85828 Personal history of other malignant neoplasm of skin: Secondary | ICD-10-CM | POA: Diagnosis not present

## 2016-09-21 DIAGNOSIS — I8311 Varicose veins of right lower extremity with inflammation: Secondary | ICD-10-CM | POA: Diagnosis not present

## 2016-09-30 DIAGNOSIS — K4090 Unilateral inguinal hernia, without obstruction or gangrene, not specified as recurrent: Secondary | ICD-10-CM | POA: Diagnosis not present

## 2016-10-19 DIAGNOSIS — L738 Other specified follicular disorders: Secondary | ICD-10-CM | POA: Diagnosis not present

## 2016-10-19 DIAGNOSIS — D225 Melanocytic nevi of trunk: Secondary | ICD-10-CM | POA: Diagnosis not present

## 2016-10-19 DIAGNOSIS — L281 Prurigo nodularis: Secondary | ICD-10-CM | POA: Diagnosis not present

## 2016-10-19 DIAGNOSIS — Z85828 Personal history of other malignant neoplasm of skin: Secondary | ICD-10-CM | POA: Diagnosis not present

## 2016-10-19 DIAGNOSIS — L821 Other seborrheic keratosis: Secondary | ICD-10-CM | POA: Diagnosis not present

## 2016-10-19 DIAGNOSIS — C44619 Basal cell carcinoma of skin of left upper limb, including shoulder: Secondary | ICD-10-CM | POA: Diagnosis not present

## 2016-10-19 DIAGNOSIS — L814 Other melanin hyperpigmentation: Secondary | ICD-10-CM | POA: Diagnosis not present

## 2016-10-19 DIAGNOSIS — D1801 Hemangioma of skin and subcutaneous tissue: Secondary | ICD-10-CM | POA: Diagnosis not present

## 2016-11-25 DIAGNOSIS — M9901 Segmental and somatic dysfunction of cervical region: Secondary | ICD-10-CM | POA: Diagnosis not present

## 2016-11-25 DIAGNOSIS — M545 Low back pain: Secondary | ICD-10-CM | POA: Diagnosis not present

## 2016-11-25 DIAGNOSIS — M9903 Segmental and somatic dysfunction of lumbar region: Secondary | ICD-10-CM | POA: Diagnosis not present

## 2016-11-25 DIAGNOSIS — M9902 Segmental and somatic dysfunction of thoracic region: Secondary | ICD-10-CM | POA: Diagnosis not present

## 2016-11-30 DIAGNOSIS — M9903 Segmental and somatic dysfunction of lumbar region: Secondary | ICD-10-CM | POA: Diagnosis not present

## 2016-11-30 DIAGNOSIS — M545 Low back pain: Secondary | ICD-10-CM | POA: Diagnosis not present

## 2016-11-30 DIAGNOSIS — M9901 Segmental and somatic dysfunction of cervical region: Secondary | ICD-10-CM | POA: Diagnosis not present

## 2016-11-30 DIAGNOSIS — M9902 Segmental and somatic dysfunction of thoracic region: Secondary | ICD-10-CM | POA: Diagnosis not present

## 2017-01-06 ENCOUNTER — Encounter (HOSPITAL_COMMUNITY): Payer: Self-pay | Admitting: Emergency Medicine

## 2017-01-06 ENCOUNTER — Ambulatory Visit: Payer: Medicare Other | Admitting: Physician Assistant

## 2017-01-06 ENCOUNTER — Ambulatory Visit (HOSPITAL_COMMUNITY)
Admission: EM | Admit: 2017-01-06 | Discharge: 2017-01-06 | Disposition: A | Discharge status: Home / Self Care | Payer: Medicare Other | Attending: Family Medicine | Admitting: Family Medicine

## 2017-01-06 DIAGNOSIS — S80811A Abrasion, right lower leg, initial encounter: Secondary | ICD-10-CM

## 2017-01-06 DIAGNOSIS — L03115 Cellulitis of right lower limb: Secondary | ICD-10-CM | POA: Diagnosis not present

## 2017-01-06 DIAGNOSIS — S8011XA Contusion of right lower leg, initial encounter: Secondary | ICD-10-CM

## 2017-01-06 MED ORDER — CEFTRIAXONE SODIUM 1 G IJ SOLR
INTRAMUSCULAR | Status: AC
  Filled 2017-01-06: qty 10

## 2017-01-06 MED ORDER — CEFTRIAXONE SODIUM 250 MG IJ SOLR
500.0000 mg | Freq: Once | INTRAMUSCULAR | Status: AC
  Administered 2017-01-06: 500 mg via INTRAMUSCULAR

## 2017-01-06 MED ORDER — CEPHALEXIN 500 MG PO CAPS: 500.0000 mg | ORAL_CAPSULE | Freq: Two times a day (BID) | ORAL | 0 refills | Status: DC

## 2017-01-06 NOTE — ED Triage Notes (Signed)
The patient presented to the [facility] with a complaint of pain, selling and redness to the lower right leg that occurred 5 days ago after hitting it.

## 2017-01-06 NOTE — Discharge Instructions (Signed)
Ice pack 10-15 min three times a day. If no improvement noted x 3 days return to clinic of re evaluation. Earlier if Sx worsens.

## 2017-01-06 NOTE — ED Provider Notes (Signed)
CSN: [id number]     Arrival date & time 01/06/17  1632 History   None    Chief Complaint  Patient presents with  . Wound Infection   (Consider location/radiation/quality/duration/timing/severity/associated sxs/prior Treatment) The history is provided by the patient.  83 y/o Male pt with PMH of DM, A Fib,  presented to UC clinic with CC of right lower leg redness and swelling x 4 days. Pt reports accidentally hit with wooden log and redness and pain gradually worsening. Linear Scabbing with superficial abrasion noted. Swelling, redness extending above and below the injury. Mild tenderness to palpation. No drainage noted. Pt was offered Tetanus shot, pt refused. Risks of Tetanus discussed with patient. Pt still refused to get Tetanus inj. Verbalizes understanding of tetanus and not being vaccinated. Pt afebrile.  Past Medical History:  Diagnosis Date  . Abnormal stress test    a. 07/2012: abnormal stress echo showing hypokinesis of the mid-distal inferior wall suggestive of ischemia; + frequent PVCs, no ST changes or CP. Dr. [name] had a long discussion with the patient about further w/u versus medical therapy and the patient elected medical therapy..  . Arthritis   . Atrial fibrillation ([place])    a. Dx 01/2016.  [name] BPH (benign prostatic hyperplasia)   . Diabetes mellitus, type 2 ([place])   . History of blood transfusion   . Hyperlipidemia   . Hypothyroid   . Lymphoma ([place])    15 years ago  . Pneumonia   . Prostate cancer ([place])   . PVC's (premature ventricular contractions)    a. 07/2012 - seen on stress echo.  [name] RBBB    a. h/o intermittent rbbb.  . Sinus bradycardia    Past Surgical History:  Procedure Laterality Date  . APPENDECTOMY    . Athroscopic knee surgery    . dental implants    . GREEN LIGHT LASER TURP (TRANSURETHRAL RESECTION OF PROSTATE  08/29/2012   Procedure: GREEN LIGHT LASER TURP (TRANSURETHRAL RESECTION OF PROSTATE;  Surgeon: [name], MD;  Location: [facility];   Service: Urology;  Laterality: N/A;  . MOHS SURGERY     x 6   Family History  Problem Relation Age of Onset  . CAD Mother   . Stroke Brother         X 2   Social History  Substance Use Topics  . Smoking status: Former Research scientist (life sciences)  . Smokeless tobacco: Never Used  . Alcohol use Yes     Comment: Rarely    Review of Systems  Constitutional: Negative.   HENT: Negative.   Respiratory: Negative.   Cardiovascular: Negative.   Skin: Positive for color change (Redness to right lwoer leg) and wound (Linear scabed wound to anterior aspect of right lower leg. ).  Neurological: Negative.   Hematological: Negative.   Psychiatric/Behavioral: Negative.     Allergies  Patient has no known allergies.  Home Medications   Prior to Admission medications   Medication Sig Start Date End Date Taking? Authorizing Provider  apixaban (ELIQUIS) 5 MG TABS tablet Take 1 tablet (5 mg total) by mouth 2 (two) times daily. 02/20/16   Lelon Perla, MD  diltiazem (CARDIZEM CD) 120 MG 24 hr capsule Take 1 capsule (120 mg total) by mouth daily. 03/12/16   Noralee Space, MD  ergocalciferol (VITAMIN D2) 50000 UNITS capsule Take 50,000 Units by mouth once a week.    [provider]  fish oil-omega-3 fatty acids 1000 MG capsule Take 2 g by mouth  daily.    [provider]  fluticasone (FLONASE) 50 MCG/ACT nasal spray Place 2 sprays into both nostrils daily. 08/28/14   Leandrew Koyanagi, MD  glucosamine-chondroitin 500-400 MG tablet Take 1 tablet by mouth daily.    [provider]  levothyroxine (SYNTHROID, LEVOTHROID) 100 MCG tablet Take 100 mcg by mouth daily.    [provider]  Methylsulfonylmethane (MSM) 500 MG CAPS Take 1 capsule by mouth daily.    [provider]  Multiple Vitamin (MULTIVITAMIN WITH MINERALS) TABS tablet Take 1 tablet by mouth daily. Reported on 08/06/2015    [provider]  rOPINIRole (REQUIP) 3 MG tablet Take 5 mg by mouth at bedtime.      [provider]   Meds Ordered and Administered this Visit   Medications  cefTRIAXone (ROCEPHIN) injection 500 mg (not administered)    BP 131/60 (BP Location: Right Arm)   Pulse 64   Temp 98.3 F (36.8 C) (Oral)   Resp 18   SpO2 96%  No data found.   Physical Exam  Constitutional: He appears well-developed and well-nourished. No distress.  HENT:  Head: Normocephalic.  Eyes: Pupils are equal, round, and reactive to light.  Neck: Normal range of motion.  Cardiovascular: Normal rate.   Pulmonary/Chest: Effort normal and breath sounds normal.  Musculoskeletal: Normal range of motion. He exhibits edema (Mild swelling to right lower leg, around the contusion site.). Tenderness: Mild tenderness to palpation at contusion and scabed wound site.  Neurological: He is alert.  Skin: Skin is warm. Capillary refill takes less than 2 seconds. There is erythema (Erythematous, swollen and warn to touch right lower leg to medial aspect of right lowerleg. Tenderness to palpation.  No drainage or fluctuance noted. ).    Urgent Care Course     Procedures (including critical care time)  Labs Review Labs Reviewed - No data to display  Imaging Review No results found.   Visual Acuity Review  Right Eye Distance:   Left Eye Distance:   Bilateral Distance:    Right Eye Near:   Left Eye Near:    Bilateral Near:         MDM   1. Cellulitis of right lower extremity   2. Contusion of right lower extremity, initial encounter   3. Abrasion of right lower extremity, initial encounter   Ice pack 10-15 min three times a day. If no improvement noted x 3 days return to clinic of re evaluation. Earlier if Sx worsens. Pt refused to get Tetanus shot. Risks and consequences for not being UTD with Tetanus discussed with patient. Pt still refused to get Td    Megean Fabio, NP 01/06/17 1753    Niko Jakel, NP 01/06/17 1756

## 2017-01-15 ENCOUNTER — Encounter (HOSPITAL_COMMUNITY): Payer: Self-pay | Admitting: *Deleted

## 2017-01-15 ENCOUNTER — Ambulatory Visit (HOSPITAL_COMMUNITY)
Admission: EM | Admit: 2017-01-15 | Discharge: 2017-01-15 | Disposition: A | Discharge status: Home / Self Care | Payer: Medicare Other | Attending: Family Medicine | Admitting: Family Medicine

## 2017-01-15 DIAGNOSIS — L03115 Cellulitis of right lower limb: Secondary | ICD-10-CM

## 2017-01-15 MED ORDER — CLINDAMYCIN HCL 150 MG PO CAPS: 150.0000 mg | ORAL_CAPSULE | Freq: Three times a day (TID) | ORAL | 0 refills | Status: DC

## 2017-01-15 NOTE — ED Provider Notes (Signed)
[place]    CSN: [id number] Arrival date & time: 01/15/17  1123     History   Chief Complaint Chief Complaint  Patient presents with  . Leg Swelling    HPI Travis Bradford is a 83 y.o. male.   This is an 83 year old man with right leg erythema and heat that was seen a week ago and prescribed Keflex along with Rocephin but failed to improve.      Past Medical History:  Diagnosis Date  . Abnormal stress test    a. 07/2012: abnormal stress echo showing hypokinesis of the mid-distal inferior wall suggestive of ischemia; + frequent PVCs, no ST changes or CP. Dr. [name] had a long discussion with the patient about further w/u versus medical therapy and the patient elected medical therapy..  . Arthritis   . Atrial fibrillation ([place])    a. Dx 01/2016.  [name] BPH (benign prostatic hyperplasia)   . Diabetes mellitus, type 2 ([place])   . History of blood transfusion   . Hyperlipidemia   . Hypothyroid   . Lymphoma ([place])    15 years ago  . Pneumonia   . Prostate cancer ([place])   . PVC's (premature ventricular contractions)    a. 07/2012 - seen on stress echo.  [name] RBBB    a. h/o intermittent rbbb.  . Sinus bradycardia     Patient Active Problem List   Diagnosis Date Noted  . Periodic limb movement sleep disorder 03/26/2016  . Snoring 02/06/2016  . Daytime hypersomnolence 02/06/2016  . Periodic limb movement disorder (PLMD) 02/06/2016  . Hx of recurrent pneumonia 02/06/2016  . HCAP (healthcare-associated pneumonia) 12/20/2013  . UTI (lower urinary tract infection) 12/20/2013  . Nonspecific abnormal electrocardiogram (ECG) (EKG) 12/10/2013  . Leukocytosis 12/09/2013  . Sinus tachycardia 12/09/2013  . Respiratory distress 12/09/2013  . Hypotension 12/09/2013  . Septic shock ([place]) 12/09/2013  . Unspecified hypothyroidism 12/09/2013  . Restless leg syndrome 12/09/2013  . Arthritis   . CAP (community acquired pneumonia) 12/08/2013  . Diabetes mellitus with renal  manifestation ([place]) 12/14/2012  . Anemia 12/14/2012  . Nonspecific abnormal unspecified cardiovascular function study 10/18/2012  . Bradycardia 07/13/2012  . Hyperlipidemia 07/13/2012    Past Surgical History:  Procedure Laterality Date  . APPENDECTOMY    . Athroscopic knee surgery    . dental implants    . GREEN LIGHT LASER TURP (TRANSURETHRAL RESECTION OF PROSTATE  08/29/2012   Procedure: GREEN LIGHT LASER TURP (TRANSURETHRAL RESECTION OF PROSTATE;  Surgeon: [name], MD;  Location: [facility];  Service: Urology;  Laterality: N/A;  . MOHS SURGERY     x 6       Home Medications    Prior to Admission medications   Medication Sig Start Date End Date Taking? Authorizing Provider  apixaban (ELIQUIS) 5 MG TABS tablet Take 1 tablet (5 mg total) by mouth 2 (two) times daily. 02/20/16   Lelon Perla, MD  cephALEXin (KEFLEX) 500 MG capsule Take 1 capsule (500 mg total) by mouth 2 (two) times daily. 01/06/17   Multani, Bhupinder, NP  clindamycin (CLEOCIN) 150 MG capsule Take 1 capsule (150 mg total) by mouth 3 (three) times daily. 01/15/17   Robyn Haber, MD  diltiazem (CARDIZEM CD) 120 MG 24 hr capsule Take 1 capsule (120 mg total) by mouth daily. 03/12/16   Noralee Space, MD  ergocalciferol (VITAMIN D2) 50000 UNITS capsule Take 50,000 Units by mouth once a week.    [provider]  fish oil-omega-3 fatty acids 1000 MG capsule Take 2 g by mouth daily.    [provider]  fluticasone (FLONASE) 50 MCG/ACT nasal spray Place 2 sprays into both nostrils daily. 08/28/14   Leandrew Koyanagi, MD  glucosamine-chondroitin 500-400 MG tablet Take 1 tablet by mouth daily.    [provider]  levothyroxine (SYNTHROID, LEVOTHROID) 100 MCG tablet Take 100 mcg by mouth daily.    [provider]  Methylsulfonylmethane (MSM) 500 MG CAPS Take 1 capsule by mouth daily.    [provider]  Multiple Vitamin (MULTIVITAMIN WITH MINERALS) TABS tablet Take 1  tablet by mouth daily. Reported on 08/06/2015    [provider]  rOPINIRole (REQUIP) 3 MG tablet Take 5 mg by mouth at bedtime.     [provider]    Family History Family History  Problem Relation Age of Onset  . CAD Mother   . Stroke Brother         X 2    Social History Social History  Substance Use Topics  . Smoking status: Former Research scientist (life sciences)  . Smokeless tobacco: Never Used  . Alcohol use Yes     Comment: Rarely     Allergies   Patient has no known allergies.   Review of Systems Review of Systems   Physical Exam Triage Vital Signs ED Triage Vitals  Enc Vitals Group     BP 01/15/17 1229 125/67     Pulse Rate 01/15/17 1229 66     Resp 01/15/17 1229 18     Temp 01/15/17 1229 98.6 F (37 C)     Temp Source 01/15/17 1229 Oral     SpO2 01/15/17 1229 100 %     Weight --      Height --      Head Circumference --      Peak Flow --      Pain Score 01/15/17 1233 5     Pain Loc --      Pain Edu? --      Excl. in [place]? --    No data found.   Updated Vital Signs BP 125/67 (BP Location: Right Arm)   Pulse 66   Temp 98.6 F (37 C) (Oral)   Resp 18   SpO2 100%   Visual Acuity  Physical Exam  Constitutional: He is oriented to person, place, and time. He appears well-developed and well-nourished.  HENT:  Right Ear: External ear normal.  Left Ear: External ear normal.  Musculoskeletal: Normal range of motion.  Neurological: He is alert and oriented to person, place, and time.  Skin: There is erythema.  Right lower extremity has erythema anteriorly extending from just below the knee down to just above the ankle and involving the lateral aspects. There is no tenderness in the calf and there is no edema  Nursing note and vitals reviewed.    UC Treatments / Results  Labs (all labs ordered are listed, but only abnormal results are displayed) Labs Reviewed - No data to display  EKG  EKG Interpretation None       Radiology No results  found.  Procedures Procedures (including critical care time)  Medications Ordered in UC Medications - No data to display   Initial Impression / Assessment and Plan / UC Course  I have reviewed the triage vital signs and the nursing notes.  Pertinent labs & imaging results that were available during my care of the patient were reviewed by me and considered in  my medical decision making (see chart for details).     Final Clinical Impressions(s) / UC Diagnoses   Final diagnoses:  Cellulitis of leg, right    New Prescriptions New Prescriptions   CLINDAMYCIN (CLEOCIN) 150 MG CAPSULE    Take 1 capsule (150 mg total) by mouth 3 (three) times daily.     Robyn Haber, MD 01/15/17 1240

## 2017-01-15 NOTE — ED Triage Notes (Signed)
Pt  Reports    Pain  And   Swelling   r  Lower      Leg       With   Redness    And  Tenderness        Seen    Er  6/13    And  Was  Given  Anti  Biotics      Not  Getting  Better

## 2017-02-15 ENCOUNTER — Encounter (HOSPITAL_COMMUNITY): Payer: Self-pay | Admitting: Emergency Medicine

## 2017-02-15 ENCOUNTER — Ambulatory Visit (HOSPITAL_COMMUNITY)
Admission: EM | Admit: 2017-02-15 | Discharge: 2017-02-15 | Disposition: A | Discharge status: Home / Self Care | Payer: Medicare Other | Attending: Internal Medicine | Admitting: Internal Medicine

## 2017-02-15 DIAGNOSIS — M25552 Pain in left hip: Secondary | ICD-10-CM

## 2017-02-15 MED ORDER — METHYLPREDNISOLONE SODIUM SUCC 125 MG IJ SOLR
80.0000 mg | Freq: Once | INTRAMUSCULAR | Status: AC
  Administered 2017-02-15: 15:00:00 via INTRAMUSCULAR

## 2017-02-15 MED ORDER — METHYLPREDNISOLONE SODIUM SUCC 125 MG IJ SOLR
INTRAMUSCULAR | Status: AC
  Filled 2017-02-15: qty 2

## 2017-02-15 NOTE — ED Triage Notes (Signed)
Pt here for left hip pain onset 1 week.... sts pain will radiate down leg.   Pain increases w/activity... When sitting and lying he is fine  A&O x4... NAD... Ambulatory... Son is at bedside.

## 2017-02-15 NOTE — Discharge Instructions (Signed)
You were given a steroid shot for inflammation, monitor your blood sugars closely. Do not take ibuprofen/motrin, other NSAID for pain given your Eliquis use. Take Tylenol for pain as needed. Follow up with orthopedics for further evaluation of your hip pain.

## 2017-02-15 NOTE — ED Provider Notes (Signed)
CSN: [id number]     Arrival date & time 02/15/17  1307 History   None    Chief Complaint  Patient presents with  . Hip Pain   (Consider location/radiation/quality/duration/timing/severity/associated sxs/prior Treatment) 83 year old male with history of back pain, diabetes, A. fib on Eliquis, comes in for one-week history of left hip pain. No known  injury. Experiences pain with standing and movement, pain resolves with sitting and laying down. Denies numbness, tingling. Has been taking Advil for the pain with some relief. States sometimes he thinks the pain is coming from his back. Denies urinary symptoms such as frequency, dysuria, hematuria.       Past Medical History:  Diagnosis Date  . Abnormal stress test    a. 07/2012: abnormal stress echo showing hypokinesis of the mid-distal inferior wall suggestive of ischemia; + frequent PVCs, no ST changes or CP. Dr. [name] had a long discussion with the patient about further w/u versus medical therapy and the patient elected medical therapy..  . Arthritis   . Atrial fibrillation ([place])    a. Dx 01/2016.  [name] BPH (benign prostatic hyperplasia)   . Diabetes mellitus, type 2 ([place])   . History of blood transfusion   . Hyperlipidemia   . Hypothyroid   . Lymphoma ([place])    15 years ago  . Pneumonia   . Prostate cancer ([place])   . PVC's (premature ventricular contractions)    a. 07/2012 - seen on stress echo.  [name] RBBB    a. h/o intermittent rbbb.  . Sinus bradycardia    Past Surgical History:  Procedure Laterality Date  . APPENDECTOMY    . Athroscopic knee surgery    . dental implants    . GREEN LIGHT LASER TURP (TRANSURETHRAL RESECTION OF PROSTATE  08/29/2012   Procedure: GREEN LIGHT LASER TURP (TRANSURETHRAL RESECTION OF PROSTATE;  Surgeon: [name], MD;  Location: [facility];  Service: Urology;  Laterality: N/A;  . MOHS SURGERY     x 6   Family History  Problem Relation Age of Onset  . CAD Mother   . Stroke Brother         X 2    Social History  Substance Use Topics  . Smoking status: Former Research scientist (life sciences)  . Smokeless tobacco: Never Used  . Alcohol use Yes     Comment: Rarely    Review of Systems  Reason unable to perform ROS: See history of present illness as above.    Allergies  Patient has no known allergies.  Home Medications   Prior to Admission medications   Medication Sig Start Date End Date Taking? Authorizing Provider  diltiazem (CARDIZEM CD) 120 MG 24 hr capsule Take 1 capsule (120 mg total) by mouth daily. 03/12/16  Yes Noralee Space, MD  ergocalciferol (VITAMIN D2) 50000 UNITS capsule Take 50,000 Units by mouth once a week.   Yes [provider]  fish oil-omega-3 fatty acids 1000 MG capsule Take 2 g by mouth daily.   Yes [provider]  glucosamine-chondroitin 500-400 MG tablet Take 1 tablet by mouth daily.   Yes [provider]  levothyroxine (SYNTHROID, LEVOTHROID) 100 MCG tablet Take 100 mcg by mouth daily.   Yes [provider]  Multiple Vitamin (MULTIVITAMIN WITH MINERALS) TABS tablet Take 1 tablet by mouth daily. Reported on 08/06/2015   Yes [provider]  rOPINIRole (REQUIP) 3 MG tablet Take 5 mg by mouth at bedtime.    Yes [provider]  apixaban Arne [place])  5 MG TABS tablet Take 1 tablet (5 mg total) by mouth 2 (two) times daily. 02/20/16   Lelon Perla, MD  cephALEXin (KEFLEX) 500 MG capsule Take 1 capsule (500 mg total) by mouth 2 (two) times daily. 01/06/17   Multani, Bhupinder, NP  clindamycin (CLEOCIN) 150 MG capsule Take 1 capsule (150 mg total) by mouth 3 (three) times daily. 01/15/17   Robyn Haber, MD  fluticasone (FLONASE) 50 MCG/ACT nasal spray Place 2 sprays into both nostrils daily. 08/28/14   Leandrew Koyanagi, MD  Methylsulfonylmethane (MSM) 500 MG CAPS Take 1 capsule by mouth daily.    [provider]   Meds Ordered and Administered this Visit   Medications  methylPREDNISolone sodium succinate  (SOLU-MEDROL) 125 mg/2 mL injection 80 mg (not administered)    BP 129/69 (BP Location: Left Arm)   Pulse 67   Temp 98.6 F (37 C) (Oral)   Resp 20   SpO2 98%  No data found.   Physical Exam  Constitutional: He is oriented to person, place, and time. He appears well-developed and well-nourished. No distress.  HENT:  Head: Normocephalic and atraumatic.  Cardiovascular: Normal rate, regular rhythm and normal heart sounds.  Exam reveals no gallop and no friction rub.   No murmur heard. Pulmonary/Chest: Effort normal and breath sounds normal. He has no wheezes. He has no rales.  Musculoskeletal:  No tenderness on palpation of the midline and back. Full range of motion. No tenderness of palpation of the hips, thigh. Fall range of motion. Strength normal and equal bilaterally. Sensation intact. Negative straight leg raise.  Painful when walking, but normal gait.  Neurological: He is alert and oriented to person, place, and time.  Skin: Skin is warm and dry.  Psychiatric: He has a normal mood and affect. His behavior is normal. Judgment normal.    Urgent Care Course     Procedures (including critical care time)  Labs Review Labs Reviewed - No data to display  Imaging Review No results found.      MDM   1. Left hip pain   ` Discussed with patient and family member possible causes of hip pain, including arthritis, muscle strain, radiating from back pain. Given patient is a Eliquis, will refrain from NSAID use. Discussed with patient possible hyperglycemia from steroid use, patient states he was borderline diabetic, has not needed medication, and would like steroid for inflammation. Solumedrol injection in office. Discussed with patient to stop NSAID use given Xarelto. Patient to take Tylenol for pain. Follow up with orthopedics for further evaluation. Discussed the importance of primary care, encourage patient to establish with PCP. Patient expresses understanding and agrees to  plan.   Ok Edwards, PA-C 02/15/17 1511

## 2017-02-17 DIAGNOSIS — M25552 Pain in left hip: Secondary | ICD-10-CM | POA: Diagnosis not present

## 2017-03-02 DIAGNOSIS — M25552 Pain in left hip: Secondary | ICD-10-CM | POA: Diagnosis not present

## 2017-03-09 DIAGNOSIS — M25552 Pain in left hip: Secondary | ICD-10-CM | POA: Diagnosis not present

## 2017-03-19 DIAGNOSIS — M25552 Pain in left hip: Secondary | ICD-10-CM | POA: Diagnosis not present

## 2017-03-19 DIAGNOSIS — M1612 Unilateral primary osteoarthritis, left hip: Secondary | ICD-10-CM | POA: Diagnosis not present

## 2017-03-30 NOTE — Progress Notes (Signed)
HPI: Follow-up atrial fibrillation. Stress echocardiogram January 2014 showed hypokinesis of the mid and distal inferior wall suggestive of ischemia. We treated medically. Patient had a CT of his neck in January 2017 that showed calcium in both the carotids and coronaries. Echocardiogram July 2017 showed ejection fraction 40-45%with hypokinesis of the basal and mid inferior lateral and inferior myocardium. Mild biatrial enlargement, mild right ventricular enlargement and mild mitral regurgitation. TSHJuly 2017 3.85. Patient seen for new onset atrial fibrillation in July 2017.Patient placed on metoprolol and anticoagulation. Metoprolol subsequent change to Cardizem because of fatigue. Since last seen patient denies dyspnea, chest pain, palpitations, syncope or bleeding. Occasional mild pedal edema.  Current Outpatient Prescriptions  Medication Sig Dispense Refill  . apixaban (ELIQUIS) 5 MG TABS tablet Take 1 tablet (5 mg total) by mouth 2 (two) times daily. 180 tablet 3  . cephALEXin (KEFLEX) 500 MG capsule Take 1 capsule (500 mg total) by mouth 2 (two) times daily. 20 capsule 0  . clindamycin (CLEOCIN) 150 MG capsule Take 1 capsule (150 mg total) by mouth 3 (three) times daily. 30 capsule 0  . diltiazem (CARDIZEM CD) 120 MG 24 hr capsule Take 1 capsule (120 mg total) by mouth daily. 30 capsule 6  . ergocalciferol (VITAMIN D2) 50000 UNITS capsule Take 50,000 Units by mouth once a week.    . fish oil-omega-3 fatty acids 1000 MG capsule Take 2 g by mouth daily.    . fluticasone (FLONASE) 50 MCG/ACT nasal spray Place 2 sprays into both nostrils daily. 16 g 10  . glucosamine-chondroitin 500-400 MG tablet Take 1 tablet by mouth daily.    [name] levothyroxine (SYNTHROID, LEVOTHROID) 100 MCG tablet Take 100 mcg by mouth daily.    . Methylsulfonylmethane (MSM) 500 MG CAPS Take 1 capsule by mouth daily.    . Multiple Vitamin (MULTIVITAMIN WITH MINERALS) TABS tablet Take 1 tablet by mouth daily. Reported  on 08/06/2015    . rOPINIRole (REQUIP) 3 MG tablet Take 5 mg by mouth at bedtime.      No current facility-administered medications for this visit.      Past Medical History:  Diagnosis Date  . Abnormal stress test    a. 07/2012: abnormal stress echo showing hypokinesis of the mid-distal inferior wall suggestive of ischemia; + frequent PVCs, no ST changes or CP. Dr. [name] had a long discussion with the patient about further w/u versus medical therapy and the patient elected medical therapy..  . Arthritis   . Atrial fibrillation ([place])    a. Dx 01/2016.  [name] BPH (benign prostatic hyperplasia)   . Diabetes mellitus, type 2 ([place])   . History of blood transfusion   . Hyperlipidemia   . Hypothyroid   . Lymphoma ([place])    15 years ago  . Pneumonia   . Prostate cancer ([place])   . PVC's (premature ventricular contractions)    a. 07/2012 - seen on stress echo.  [name] RBBB    a. h/o intermittent rbbb.  . Sinus bradycardia     Past Surgical History:  Procedure Laterality Date  . APPENDECTOMY    . Athroscopic knee surgery    . dental implants    . GREEN LIGHT LASER TURP (TRANSURETHRAL RESECTION OF PROSTATE  08/29/2012   Procedure: GREEN LIGHT LASER TURP (TRANSURETHRAL RESECTION OF PROSTATE;  Surgeon: [name], MD;  Location: [facility];  Service: Urology;  Laterality: N/A;  . MOHS SURGERY     x 6    Social  History   Social History  . Marital status: Widowed    Spouse name: N/A  . Number of children: 2  . Years of education: N/A   Occupational History  . Not on file.   Social History Main Topics  . Smoking status: Former Research scientist (life sciences)  . Smokeless tobacco: Never Used  . Alcohol use Yes     Comment: Rarely  . Drug use: Unknown  . Sexual activity: Not on file   Other Topics Concern  . Not on file   Social History Narrative  . No narrative on file    Family History  Problem Relation Age of Onset  . CAD Mother   . Stroke Brother         X 2    ROS: Arthralgias but no  fevers or chills, productive cough, hemoptysis, dysphasia, odynophagia, melena, hematochezia, dysuria, hematuria, rash, seizure activity, orthopnea, PND, claudication. Remaining systems are negative.  Physical Exam: Well-developed well-nourished in no acute distress.  Skin is warm and dry.  HEENT is normal.  Neck is supple.  Chest is clear to auscultation with normal expansion.  Cardiovascular exam is irregular Abdominal exam nontender or distended. No masses palpated. Extremities show trace edema. neuro grossly intact  ECG- atrial fibrillation at a rate of 65. Left anterior fascicular block. Right bundle branch block. personally reviewed  A/P  1 permanent atrial fibrillation-patient remains in atrial fibrillation today. Continue Cardizem for rate control. Continue anticoagulation. We have elected to treat with rate control and anticoagulation.  2 history of mild cardiomyopathy-question whether this was tachycardia mediated. We will plan to repeat his echocardiogram. He has not tolerated beta blockers in the past because of fatigue.   3 history of abnormal functional study-we are treating conservatively as he has not had symptoms.  4 hyperlipidemia-management per primary care.     [name], MD

## 2017-04-01 DIAGNOSIS — M1612 Unilateral primary osteoarthritis, left hip: Secondary | ICD-10-CM | POA: Diagnosis not present

## 2017-04-05 ENCOUNTER — Encounter: Payer: Self-pay | Admitting: Cardiology

## 2017-04-05 ENCOUNTER — Ambulatory Visit (INDEPENDENT_AMBULATORY_CARE_PROVIDER_SITE_OTHER): Payer: Medicare Other | Admitting: Cardiology

## 2017-04-05 VITALS — BP 118/62 | HR 65 | Ht 70.0 in | Wt 218.0 lb

## 2017-04-05 DIAGNOSIS — E7800 Pure hypercholesterolemia, unspecified: Secondary | ICD-10-CM

## 2017-04-05 DIAGNOSIS — I4819 Other persistent atrial fibrillation: Secondary | ICD-10-CM

## 2017-04-05 DIAGNOSIS — R9439 Abnormal result of other cardiovascular function study: Secondary | ICD-10-CM

## 2017-04-05 DIAGNOSIS — I481 Persistent atrial fibrillation: Secondary | ICD-10-CM

## 2017-04-05 NOTE — Patient Instructions (Signed)

## 2017-04-06 DIAGNOSIS — M1612 Unilateral primary osteoarthritis, left hip: Secondary | ICD-10-CM | POA: Diagnosis not present

## 2017-04-06 LAB — BASIC METABOLIC PANEL
BUN/Creatinine Ratio: 23 (ref 10–24)
BUN: 22 mg/dL (ref 8–27)
CO2: 24 mmol/L (ref 20–29)
Calcium: 9.8 mg/dL (ref 8.6–10.2)
Chloride: 102 mmol/L (ref 96–106)
Creatinine, Ser: 0.97 mg/dL (ref 0.76–1.27)
GFR calc Af Amer: 80 mL/min/{1.73_m2} (ref >59)
GFR calc non Af Amer: 69 mL/min/{1.73_m2} (ref >59)
GLUCOSE: 131 mg/dL — AB (ref 65–99)
POTASSIUM: 4.6 mmol/L (ref 3.5–5.2)
SODIUM: 139 mmol/L (ref 134–144)

## 2017-04-06 LAB — CBC
Hematocrit: 39.8 % (ref 37.5–51.0)
Hemoglobin: 13.1 g/dL (ref 13.0–17.7)
MCH: 33.2 pg — ABNORMAL HIGH (ref 26.6–33.0)
MCHC: 32.9 g/dL (ref 31.5–35.7)
MCV: 101 fL — ABNORMAL HIGH (ref 79–97)
Platelets: 270 10*3/uL (ref 150–379)
RBC: 3.94 x10E6/uL — ABNORMAL LOW (ref 4.14–5.80)
RDW: 13.2 % (ref 12.3–15.4)
WBC: 9.4 10*3/uL (ref 3.4–10.8)

## 2017-04-07 ENCOUNTER — Telehealth: Payer: Self-pay | Admitting: Cardiology

## 2017-04-07 NOTE — Telephone Encounter (Signed)
New Message   pt son verbalized that he is returning call for rn   For lab results

## 2017-04-07 NOTE — Telephone Encounter (Signed)
Patient had BMET/CBC   Notes recorded by Lelon Perla, MD on 04/06/2017 at 7:25 AM EDT No change in meds [name] _____________________________  Returned call and spoke to son [name] - DPR Notified of results

## 2017-04-09 DIAGNOSIS — M1612 Unilateral primary osteoarthritis, left hip: Secondary | ICD-10-CM | POA: Diagnosis not present

## 2017-04-13 DIAGNOSIS — M1612 Unilateral primary osteoarthritis, left hip: Secondary | ICD-10-CM | POA: Diagnosis not present

## 2017-04-15 DIAGNOSIS — M1612 Unilateral primary osteoarthritis, left hip: Secondary | ICD-10-CM | POA: Diagnosis not present

## 2017-04-19 ENCOUNTER — Other Ambulatory Visit: Payer: Self-pay

## 2017-04-19 ENCOUNTER — Ambulatory Visit (HOSPITAL_COMMUNITY): Payer: Medicare Other | Attending: Cardiology

## 2017-04-19 DIAGNOSIS — E119 Type 2 diabetes mellitus without complications: Secondary | ICD-10-CM | POA: Insufficient documentation

## 2017-04-19 DIAGNOSIS — Z87891 Personal history of nicotine dependence: Secondary | ICD-10-CM | POA: Insufficient documentation

## 2017-04-19 DIAGNOSIS — E785 Hyperlipidemia, unspecified: Secondary | ICD-10-CM | POA: Diagnosis not present

## 2017-04-19 DIAGNOSIS — I481 Persistent atrial fibrillation: Secondary | ICD-10-CM

## 2017-04-19 DIAGNOSIS — I4819 Other persistent atrial fibrillation: Secondary | ICD-10-CM

## 2017-04-19 DIAGNOSIS — I071 Rheumatic tricuspid insufficiency: Secondary | ICD-10-CM | POA: Insufficient documentation

## 2017-04-21 DIAGNOSIS — C44612 Basal cell carcinoma of skin of right upper limb, including shoulder: Secondary | ICD-10-CM | POA: Diagnosis not present

## 2017-04-21 DIAGNOSIS — L309 Dermatitis, unspecified: Secondary | ICD-10-CM | POA: Diagnosis not present

## 2017-04-21 DIAGNOSIS — L821 Other seborrheic keratosis: Secondary | ICD-10-CM | POA: Diagnosis not present

## 2017-04-21 DIAGNOSIS — L570 Actinic keratosis: Secondary | ICD-10-CM | POA: Diagnosis not present

## 2017-04-21 DIAGNOSIS — D1801 Hemangioma of skin and subcutaneous tissue: Secondary | ICD-10-CM | POA: Diagnosis not present

## 2017-04-21 DIAGNOSIS — L281 Prurigo nodularis: Secondary | ICD-10-CM | POA: Diagnosis not present

## 2017-04-21 DIAGNOSIS — C44712 Basal cell carcinoma of skin of right lower limb, including hip: Secondary | ICD-10-CM | POA: Diagnosis not present

## 2017-04-21 DIAGNOSIS — L814 Other melanin hyperpigmentation: Secondary | ICD-10-CM | POA: Diagnosis not present

## 2017-04-21 DIAGNOSIS — Z85828 Personal history of other malignant neoplasm of skin: Secondary | ICD-10-CM | POA: Diagnosis not present

## 2017-04-21 DIAGNOSIS — L820 Inflamed seborrheic keratosis: Secondary | ICD-10-CM | POA: Diagnosis not present

## 2017-04-22 DIAGNOSIS — M25552 Pain in left hip: Secondary | ICD-10-CM | POA: Diagnosis not present

## 2017-04-22 DIAGNOSIS — M1612 Unilateral primary osteoarthritis, left hip: Secondary | ICD-10-CM | POA: Diagnosis not present

## 2017-04-28 DIAGNOSIS — M1612 Unilateral primary osteoarthritis, left hip: Secondary | ICD-10-CM | POA: Diagnosis not present

## 2017-05-04 DIAGNOSIS — M1612 Unilateral primary osteoarthritis, left hip: Secondary | ICD-10-CM | POA: Diagnosis not present

## 2017-05-06 DIAGNOSIS — M1612 Unilateral primary osteoarthritis, left hip: Secondary | ICD-10-CM | POA: Diagnosis not present

## 2017-05-11 DIAGNOSIS — Z23 Encounter for immunization: Secondary | ICD-10-CM | POA: Diagnosis not present

## 2017-05-12 DIAGNOSIS — M1612 Unilateral primary osteoarthritis, left hip: Secondary | ICD-10-CM | POA: Diagnosis not present

## 2017-05-14 DIAGNOSIS — M1612 Unilateral primary osteoarthritis, left hip: Secondary | ICD-10-CM | POA: Diagnosis not present

## 2017-05-17 DIAGNOSIS — M1612 Unilateral primary osteoarthritis, left hip: Secondary | ICD-10-CM | POA: Diagnosis not present

## 2017-05-27 DIAGNOSIS — M1612 Unilateral primary osteoarthritis, left hip: Secondary | ICD-10-CM | POA: Diagnosis not present

## 2017-06-02 DIAGNOSIS — M1612 Unilateral primary osteoarthritis, left hip: Secondary | ICD-10-CM | POA: Diagnosis not present

## 2017-06-02 DIAGNOSIS — M25552 Pain in left hip: Secondary | ICD-10-CM | POA: Diagnosis not present

## 2017-06-07 ENCOUNTER — Telehealth: Payer: Self-pay | Admitting: *Deleted

## 2017-06-07 NOTE — Telephone Encounter (Signed)
Spoke with pt son, he was calling with questions regarding eliquis and aleve. His father has been having trouble with his hip and getas relief from the [facility] and has talked about stopping the eliquis to take aleve. Explained to the son a short course of aleve can be done, 2-3 days but extended use will increase his risk of bleeding should he fall or get hurt. Also discussed the risk of stroke if eliquis is stopped due to his persistent atrial fib. Son voiced understanding and will discuss with his father and call back with any other questions.

## 2017-10-13 DIAGNOSIS — D485 Neoplasm of uncertain behavior of skin: Secondary | ICD-10-CM | POA: Diagnosis not present

## 2017-10-13 DIAGNOSIS — L814 Other melanin hyperpigmentation: Secondary | ICD-10-CM | POA: Diagnosis not present

## 2017-10-13 DIAGNOSIS — L570 Actinic keratosis: Secondary | ICD-10-CM | POA: Diagnosis not present

## 2017-10-13 DIAGNOSIS — L281 Prurigo nodularis: Secondary | ICD-10-CM | POA: Diagnosis not present

## 2017-10-13 DIAGNOSIS — L84 Corns and callosities: Secondary | ICD-10-CM | POA: Diagnosis not present

## 2017-10-13 DIAGNOSIS — Z85828 Personal history of other malignant neoplasm of skin: Secondary | ICD-10-CM | POA: Diagnosis not present

## 2017-10-13 DIAGNOSIS — L821 Other seborrheic keratosis: Secondary | ICD-10-CM | POA: Diagnosis not present

## 2017-10-14 ENCOUNTER — Ambulatory Visit: Payer: Medicare Other | Admitting: Physician Assistant

## 2017-10-14 ENCOUNTER — Encounter: Payer: Self-pay | Admitting: Physician Assistant

## 2017-10-14 VITALS — BP 118/62 | HR 54 | Ht 70.0 in | Wt 225.4 lb

## 2017-10-14 DIAGNOSIS — R5383 Other fatigue: Secondary | ICD-10-CM | POA: Diagnosis not present

## 2017-10-14 DIAGNOSIS — Z79899 Other long term (current) drug therapy: Secondary | ICD-10-CM | POA: Diagnosis not present

## 2017-10-14 DIAGNOSIS — E039 Hypothyroidism, unspecified: Secondary | ICD-10-CM | POA: Diagnosis not present

## 2017-10-14 DIAGNOSIS — E785 Hyperlipidemia, unspecified: Secondary | ICD-10-CM | POA: Diagnosis not present

## 2017-10-14 DIAGNOSIS — E119 Type 2 diabetes mellitus without complications: Secondary | ICD-10-CM | POA: Diagnosis not present

## 2017-10-14 DIAGNOSIS — R001 Bradycardia, unspecified: Secondary | ICD-10-CM | POA: Diagnosis not present

## 2017-10-14 DIAGNOSIS — I482 Chronic atrial fibrillation, unspecified: Secondary | ICD-10-CM

## 2017-10-14 NOTE — Progress Notes (Signed)
Cardiology Office Note    Date:  10/15/2017   ID:  Travis Bradford, DOB March 08, 1926, MRN [id number]  PCP:  Patient, No Pcp Per  Cardiologist:  Dr. Stanford Bradford   Chief Complaint  Patient presents with  . Follow-up    Fatigue. Seen for Dr. Stanford Bradford    History of Present Illness:  Travis Bradford is a 84 y.o. male with PMH of chronic atrial fibrillation, hypothyroidism, hyperlipidemia, DM 2, and history of sinus bradycardia.  Stress echocardiogram in January 2014 showed hypokinesis of the mid and distal inferior wall suggestive of ischemia, this was treated medically.  CT of neck in 2017 that showed calcium in both carotid and coronary vessels.  Echocardiogram in July 2017 showed EF 40-45% with hypokinesis of the basal and mid inferolateral and inferior myocardium, mild biatrial enlargement, mild RV enlargement.  He was first diagnosed with atrial fibrillation in July 2017.  He was placed on metoprolol and Eliquis.  Metoprolol was later changed to Cardizem due to side effect of fatigue.  He was last seen by Dr. Stanford Bradford on 04/05/2017, at which time he was doing well.  Patient presents to cardiology office today along with his son.   Reviewing of the previous record on 01/31/2009, sleep study obtained at that time showed severe periodic limb movement.  Lowest desaturation was 90%.  Although there was no obvious significant obstructive sleep apnea at the time.  He was seen by Dr. Lenna Bradford in August 2017 for snoring and daytime fatigue and was tested again for OSA.  According to both the patient and his son, they have tried different CPAP therapy in the past without success.  He says he was unable to tolerate both the mouthguard and full facial mask.  He tried a nasal pillow as well.  He presents today with main complaint of fatigue.  He only sleeps about 5-6 hours at night, and tend to wake up very early.  He has daytime drowsiness.  He denies any chest pain or shortness of breath.  There is no lower extremity  edema, orthopnea or PND.  I will obtain lab work including CBC, basic metabolic panel and a TSH to rule out secondary causes.  But I think his daytime fatigue is a combination of his age and poor sleep hygiene.  Although his pulse is in the 50s while in atrial fibrillation, however there is no concrete evidence that this is contributing to his fatigue.   Past Medical History:  Diagnosis Date  . Abnormal stress test    a. 07/2012: abnormal stress echo showing hypokinesis of the mid-distal inferior wall suggestive of ischemia; + frequent PVCs, no ST changes or CP. Dr. Stanford Bradford had a long discussion with the patient about further w/u versus medical therapy and the patient elected medical therapy..  . Arthritis   . Atrial fibrillation (Travis Bradford)    a. Dx 01/2016.  [name] BPH (benign prostatic hyperplasia)   . Diabetes mellitus, type 2 ([place])   . History of blood transfusion   . Hyperlipidemia   . Hypothyroid   . Lymphoma ([place])    15 years ago  . Pneumonia   . Prostate cancer ([place])   . PVC's (premature ventricular contractions)    a. 07/2012 - seen on stress echo.  [name] RBBB    a. h/o intermittent rbbb.  . Sinus bradycardia     Past Surgical History:  Procedure Laterality Date  . APPENDECTOMY    . Athroscopic knee surgery    . dental implants    .  GREEN LIGHT LASER TURP (TRANSURETHRAL RESECTION OF PROSTATE  08/29/2012   Procedure: GREEN LIGHT LASER TURP (TRANSURETHRAL RESECTION OF PROSTATE;  Surgeon: Travis Rud, MD;  Location: [facility];  Service: Urology;  Laterality: N/A;  . MOHS SURGERY     x 6    Current Medications: Outpatient Medications Prior to Visit  Medication Sig Dispense Refill  . apixaban (ELIQUIS) 5 MG TABS tablet Take 1 tablet (5 mg total) by mouth 2 (two) times daily. 180 tablet 3  . cephALEXin (KEFLEX) 500 MG capsule Take 1 capsule (500 mg total) by mouth 2 (two) times daily. 20 capsule 0  . clindamycin (CLEOCIN) 150 MG capsule Take 1 capsule (150 mg total) by mouth 3  (three) times daily. 30 capsule 0  . diltiazem (CARDIZEM CD) 120 MG 24 hr capsule Take 1 capsule (120 mg total) by mouth daily. 30 capsule 6  . ergocalciferol (VITAMIN D2) 50000 UNITS capsule Take 50,000 Units by mouth once a week.    . fish oil-omega-3 fatty acids 1000 MG capsule Take 2 g by mouth daily.    . fluticasone (FLONASE) 50 MCG/ACT nasal spray Place 2 sprays into both nostrils daily. 16 g 10  . glucosamine-chondroitin 500-400 MG tablet Take 1 tablet by mouth daily.    [name] levothyroxine (SYNTHROID, LEVOTHROID) 100 MCG tablet Take 100 mcg by mouth daily.    . Methylsulfonylmethane (MSM) 500 MG CAPS Take 1 capsule by mouth daily.    . Multiple Vitamin (MULTIVITAMIN WITH MINERALS) TABS tablet Take 1 tablet by mouth daily. Reported on 08/06/2015    . rOPINIRole (REQUIP) 3 MG tablet Take 5 mg by mouth at bedtime.      No facility-administered medications prior to visit.      Allergies:   Patient has no known allergies.   Social History   Socioeconomic History  . Marital status: Widowed    Spouse name: Not on file  . Number of children: 2  . Years of education: Not on file  . Highest education level: Not on file  Occupational History  . Not on file  Social Needs  . Financial resource strain: Not on file  . Food insecurity:    Worry: Not on file    Inability: Not on file  . Transportation needs:    Medical: Not on file    Non-medical: Not on file  Tobacco Use  . Smoking status: Former Research scientist (life sciences)  . Smokeless tobacco: Never Used  Substance and Sexual Activity  . Alcohol use: Yes    Comment: Rarely  . Drug use: Not on file  . Sexual activity: Not on file  Lifestyle  . Physical activity:    Days per week: Not on file    Minutes per session: Not on file  . Stress: Not on file  Relationships  . Social connections:    Talks on phone: Not on file    Gets together: Not on file    Attends religious service: Not on file    Active member of club or organization: Not on file     Attends meetings of clubs or organizations: Not on file    Relationship status: Not on file  Other Topics Concern  . Not on file  Social History Narrative  . Not on file     Family History:  The patient's family history includes CAD in his mother; Stroke in his brother.   ROS:   Please see the history of present illness.    ROS All  other systems reviewed and are negative.   PHYSICAL EXAM:   VS:  BP 118/62   Pulse (!) 54   Ht 5\' 10"  (1.778 m)   Wt 225 lb 6.4 oz (102.2 kg)   BMI 32.34 kg/m    GEN: Well nourished, well developed, in no acute distress  HEENT: normal  Neck: no JVD, carotid bruits, or masses Cardiac: irregularly irregular; no murmurs, rubs, or gallops,no edema  Respiratory:  clear to auscultation bilaterally, normal work of breathing GI: soft, nontender, nondistended, + BS MS: no deformity or atrophy  Skin: warm and dry, no rash Neuro:  Alert and Oriented x 3, Strength and sensation are intact Psych: euthymic mood, full affect  Wt Readings from Last 3 Encounters:  10/14/17 225 lb 6.4 oz (102.2 kg)  04/05/17 218 lb (98.9 kg)  08/04/16 209 lb (94.8 kg)      Studies/Labs Reviewed:   EKG:  EKG is ordered today.  The ekg ordered today demonstrates atrial fibrillation with HR 54, singe prolonged pause 2.32 seconds  Recent Labs: 10/14/2017: BUN 23; Creatinine, Ser 1.06; Hemoglobin 13.5; Platelets 286; Potassium 4.5; Sodium 143; TSH 3.650   Lipid Panel No results found for: CHOL, TRIG, HDL, CHOLHDL, VLDL, LDLCALC, LDLDIRECT  Additional studies/ records that were reviewed today include:  Echo 04/19/2018 LV EF: 55% -   60%  Study Conclusions  - Left ventricle: The cavity size was normal. Systolic function was   normal. The estimated ejection fraction was in the range of 55%   to 60%. Wall motion was normal; there were no regional wall   motion abnormalities. The study is not technically sufficient to   allow evaluation of LV diastolic function. - Aorta:  Aortic root dimension: 41 mm (ED). - Ascending aorta: The ascending aorta was mildly dilated. - Mitral valve: Calcified annulus. - Left atrium: The atrium was mildly dilated. - Right ventricle: The cavity size was mildly dilated. Wall   thickness was normal. - Tricuspid valve: There was mild regurgitation.     ASSESSMENT:    1. Other fatigue   2. Medication management   3. Chronic atrial fibrillation ([place])   4. Hypothyroidism, unspecified type   5. Hyperlipidemia, unspecified hyperlipidemia type   6. Controlled type 2 diabetes mellitus without complication, without long-term current use of insulin ([place])   7. Bradycardia      PLAN:  In order of problems listed above:  1. Fatigue: will obtain CBC, basic metabolic panel and a TSH to rule out secondary causes.  Suspicion for cardiac cause is relatively low.  I think the main contributor to his fatigue is his age and poor sleep hygiene.  2. Chronic atrial fibrillation: Bradycardic in the 50s, however I do not think this is contributing to his fatigue.  He is on low-dose diltiazem and Eliquis.  3. Hyperlipidemia: On fish oil.  Will defer annual lipid panel to primary care provider  4. DM II: Managed by primary care provider.  5. Hypothyroidism: Managed by primary care provider.    Medication Adjustments/Labs and Tests Ordered: Current medicines are reviewed at length with the patient today.  Concerns regarding medicines are outlined above.  Medication changes, Labs and Tests ordered today are listed in the Patient Instructions below. Patient Instructions  Medication Instructions:  Continue current medications  If you need a refill on your cardiac medications before your next appointment, please call your pharmacy.  Labwork: CBC, BMP and TSH HERE IN OUR OFFICE AT [facility]  Take the provided lab  slips for you to take with you to the lab for you blood draw.   You will NOT need to fast   Testing/Procedures: None  Ordered  Follow-Up: Your physician wants you to follow-up in: 3-4 Months with Dr Travis Bradford.     Thank you for choosing [facility], [place], [place]  10/15/2017 2:15 PM    [facility] [address], [place], [place]  [address] Phone: [phone]; Fax: [phone]

## 2017-10-14 NOTE — Patient Instructions (Signed)
Medication Instructions:  Continue current medications  If you need a refill on your cardiac medications before your next appointment, please call your pharmacy.  Labwork: CBC, BMP and TSH HERE IN OUR OFFICE AT [facility]  Take the provided lab slips for you to take with you to the lab for you blood draw.   You will NOT need to fast   Testing/Procedures: None Ordered  Follow-Up: Your physician wants you to follow-up in: 3-4 Months with Dr [name].     Thank you for choosing [facility] at [facility]!!

## 2017-10-15 ENCOUNTER — Encounter: Payer: Self-pay | Admitting: Physician Assistant

## 2017-10-15 ENCOUNTER — Telehealth: Payer: Self-pay | Admitting: Physician Assistant

## 2017-10-15 LAB — BASIC METABOLIC PANEL
BUN / CREAT RATIO: 22 (ref 10–24)
BUN: 23 mg/dL (ref 8–27)
CHLORIDE: 104 mmol/L (ref 96–106)
CO2: 27 mmol/L (ref 20–29)
Calcium: 9.7 mg/dL (ref 8.6–10.2)
Creatinine, Ser: 1.06 mg/dL (ref 0.76–1.27)
GFR calc non Af Amer: 62 mL/min/{1.73_m2} (ref >59)
GFR, EST AFRICAN AMERICAN: 72 mL/min/{1.73_m2} (ref >59)
Glucose: 106 mg/dL — ABNORMAL HIGH (ref 65–99)
POTASSIUM: 4.5 mmol/L (ref 3.5–5.2)
SODIUM: 143 mmol/L (ref 134–144)

## 2017-10-15 LAB — CBC
Hematocrit: 41.1 % (ref 37.5–51.0)
Hemoglobin: 13.5 g/dL (ref 13.0–17.7)
MCH: 32.7 pg (ref 26.6–33.0)
MCHC: 32.8 g/dL (ref 31.5–35.7)
MCV: 100 fL — ABNORMAL HIGH (ref 79–97)
PLATELETS: 286 10*3/uL (ref 150–379)
RBC: 4.13 x10E6/uL — ABNORMAL LOW (ref 4.14–5.80)
RDW: 13.5 % (ref 12.3–15.4)
WBC: 8.4 10*3/uL (ref 3.4–10.8)

## 2017-10-15 LAB — TSH: TSH: 3.65 u[IU]/mL (ref 0.450–4.500)

## 2017-10-15 NOTE — Progress Notes (Signed)
Thyroid, electrolyte and red blood cell count stable. No obvious cause for his fatigue identified. I think it is still related to the combination of age and poor sleep

## 2017-10-15 NOTE — Telephone Encounter (Signed)
Spoke with Patients son in office and discussed lab results

## 2017-10-15 NOTE — Telephone Encounter (Signed)
Patient son Travis Bradford) returning call for lab results

## 2017-10-28 DIAGNOSIS — H04123 Dry eye syndrome of bilateral lacrimal glands: Secondary | ICD-10-CM | POA: Diagnosis not present

## 2018-01-04 DIAGNOSIS — Z6832 Body mass index (BMI) 32.0-32.9, adult: Secondary | ICD-10-CM | POA: Diagnosis not present

## 2018-01-04 DIAGNOSIS — E039 Hypothyroidism, unspecified: Secondary | ICD-10-CM | POA: Diagnosis not present

## 2018-01-04 DIAGNOSIS — E669 Obesity, unspecified: Secondary | ICD-10-CM | POA: Diagnosis not present

## 2018-01-04 DIAGNOSIS — I4891 Unspecified atrial fibrillation: Secondary | ICD-10-CM | POA: Diagnosis not present

## 2018-01-04 DIAGNOSIS — Z7901 Long term (current) use of anticoagulants: Secondary | ICD-10-CM | POA: Diagnosis not present

## 2018-01-04 DIAGNOSIS — K219 Gastro-esophageal reflux disease without esophagitis: Secondary | ICD-10-CM | POA: Diagnosis not present

## 2018-01-04 DIAGNOSIS — Z720 Tobacco use: Secondary | ICD-10-CM | POA: Diagnosis not present

## 2018-01-04 DIAGNOSIS — Z823 Family history of stroke: Secondary | ICD-10-CM | POA: Diagnosis not present

## 2018-01-04 DIAGNOSIS — G2581 Restless legs syndrome: Secondary | ICD-10-CM | POA: Diagnosis not present

## 2018-01-04 DIAGNOSIS — C8590 Non-Hodgkin lymphoma, unspecified, unspecified site: Secondary | ICD-10-CM | POA: Diagnosis not present

## 2018-01-17 NOTE — Progress Notes (Addendum)
HPI: Follow-up atrial fibrillation. Stress echocardiogram January 2014 showed hypokinesis of the mid and distal inferior wall suggestive of ischemia. We treated medically. Patient had a CT of his neck in January 2017 that showed calcium in both carotids and coronaries. Echocardiogram July 2017 showed ejection fraction 40-45%with hypokinesis of the basal and mid inferior lateral and inferior myocardium. Mild biatrial enlargement, mild right ventricular enlargement and mild mitral regurgitation. Patient seen for new onset atrial fibrillation in July 2017.Patient placed on metoprolol and anticoagulation. Metoprolol subsequent change to Cardizem because of fatigue.  Echocardiogram repeated September 2018 and showed normal LV function, mildly dilated aortic root and ascending aorta, mild left atrial enlargement and mild right ventricular enlargement.  Since last seen there is no dyspnea, chest pain, palpitations, syncope or bleeding.  He does complain of fatigue.   Current Outpatient Medications  Medication Sig Dispense Refill  . apixaban (ELIQUIS) 5 MG TABS tablet Take 1 tablet (5 mg total) by mouth 2 (two) times daily. 180 tablet 3  . diltiazem (CARDIZEM CD) 120 MG 24 hr capsule Take 1 capsule (120 mg total) by mouth daily. 30 capsule 6  . ergocalciferol (VITAMIN D2) 50000 UNITS capsule Take 50,000 Units by mouth once a week.    . fish oil-omega-3 fatty acids 1000 MG capsule Take 2 g by mouth daily.    [name] glucosamine-chondroitin 500-400 MG tablet Take 1 tablet by mouth daily.    [name] levothyroxine (SYNTHROID, LEVOTHROID) 100 MCG tablet Take 100 mcg by mouth daily.    . Methylsulfonylmethane (MSM) 500 MG CAPS Take 1 capsule by mouth daily.    . Multiple Vitamin (MULTIVITAMIN WITH MINERALS) TABS tablet Take 1 tablet by mouth daily. Reported on 08/06/2015    . rOPINIRole (REQUIP) 3 MG tablet Take 5 mg by mouth at bedtime.      No current facility-administered medications for this visit.      Past  Medical History:  Diagnosis Date  . Abnormal stress test    a. 07/2012: abnormal stress echo showing hypokinesis of the mid-distal inferior wall suggestive of ischemia; + frequent PVCs, no ST changes or CP. Dr. [name] had a long discussion with the patient about further w/u versus medical therapy and the patient elected medical therapy..  . Arthritis   . Atrial fibrillation ([place])    a. Dx 01/2016.  [name] BPH (benign prostatic hyperplasia)   . Diabetes mellitus, type 2 ([place])   . History of blood transfusion   . Hyperlipidemia   . Hypothyroid   . Lymphoma ([place])    15 years ago  . Pneumonia   . Prostate cancer ([place])   . PVC's (premature ventricular contractions)    a. 07/2012 - seen on stress echo.  [name] RBBB    a. h/o intermittent rbbb.  . Sinus bradycardia     Past Surgical History:  Procedure Laterality Date  . APPENDECTOMY    . Athroscopic knee surgery    . dental implants    . GREEN LIGHT LASER TURP (TRANSURETHRAL RESECTION OF PROSTATE  08/29/2012   Procedure: GREEN LIGHT LASER TURP (TRANSURETHRAL RESECTION OF PROSTATE;  Surgeon: [name], MD;  Location: [facility];  Service: Urology;  Laterality: N/A;  . MOHS SURGERY     x 6    Social History   Socioeconomic History  . Marital status: Widowed    Spouse name: Not on file  . Number of children: 2  . Years of education: Not on file  . Highest  education level: Not on file  Occupational History  . Not on file  Social Needs  . Financial resource strain: Not on file  . Food insecurity:    Worry: Not on file    Inability: Not on file  . Transportation needs:    Medical: Not on file    Non-medical: Not on file  Tobacco Use  . Smoking status: Former Research scientist (life sciences)  . Smokeless tobacco: Never Used  Substance and Sexual Activity  . Alcohol use: Yes    Comment: Rarely  . Drug use: Not on file  . Sexual activity: Not on file  Lifestyle  . Physical activity:    Days per week: Not on file    Minutes per session: Not on file    . Stress: Not on file  Relationships  . Social connections:    Talks on phone: Not on file    Gets together: Not on file    Attends religious service: Not on file    Active member of club or organization: Not on file    Attends meetings of clubs or organizations: Not on file    Relationship status: Not on file  . Intimate partner violence:    Fear of current or ex partner: Not on file    Emotionally abused: Not on file    Physically abused: Not on file    Forced sexual activity: Not on file  Other Topics Concern  . Not on file  Social History Narrative  . Not on file    Family History  Problem Relation Age of Onset  . CAD Mother   . Stroke Brother         X 2    ROS: no fevers or chills, productive cough, hemoptysis, dysphasia, odynophagia, melena, hematochezia, dysuria, hematuria, rash, seizure activity, orthopnea, PND, pedal edema, claudication. Remaining systems are negative.  Physical Exam: Well-developed well-nourished in no acute distress.  Skin is warm and dry.  HEENT is normal.  Neck is supple.  Chest is clear to auscultation with normal expansion.  Cardiovascular exam is irregular Abdominal exam nontender or distended. No masses palpated. Extremities show no edema. neuro grossly intact  ECG-atrial fibrillation at a rate of 75.  Occasional PVC.  Right bundle branch block.  Left anterior fascicular block.  Personally reviewed  A/P  1 permanent atrial fibrillation-patient remains in atrial fibrillation on examination.  Continue Cardizem for rate control.  Continue apixaban.  Patient is describing fatigue.  He has multiple reasons to be fatigued including untreated sleep apnea, taking sleep medications at night and also possibly bradycardia.  I will arrange a 24-hour Holter monitor to further assess.  2 hyperlipidemia-managed by primary care.  3 history of abnormal functional study-patient denies chest pain or worsening dyspnea.  We have elected to treat this  conservatively.  4 history of mild cardiomyopathy-LV function has improved on most recent echocardiogram.  He may have had a mild cardiomyopathy in the past related to atrial fibrillation/tachycardia.  Cardizem.  He did not tolerate beta-blockade in the past.  5 obstructive sleep apnea-H/O sleep apnea but not using CPAP; will refer for further evaluation including sleep study if needed.  [name], MD

## 2018-01-25 ENCOUNTER — Encounter: Payer: Self-pay | Admitting: Cardiology

## 2018-01-25 ENCOUNTER — Ambulatory Visit: Payer: Medicare HMO | Admitting: Cardiology

## 2018-01-25 VITALS — BP 111/64 | HR 45 | Ht 69.0 in | Wt 221.2 lb

## 2018-01-25 DIAGNOSIS — R001 Bradycardia, unspecified: Secondary | ICD-10-CM | POA: Diagnosis not present

## 2018-01-25 DIAGNOSIS — I4819 Other persistent atrial fibrillation: Secondary | ICD-10-CM

## 2018-01-25 DIAGNOSIS — I481 Persistent atrial fibrillation: Secondary | ICD-10-CM

## 2018-01-25 DIAGNOSIS — R9439 Abnormal result of other cardiovascular function study: Secondary | ICD-10-CM

## 2018-01-25 DIAGNOSIS — E7800 Pure hypercholesterolemia, unspecified: Secondary | ICD-10-CM | POA: Diagnosis not present

## 2018-01-25 NOTE — Patient Instructions (Signed)
Medication Instructions:   NO CHANGE  Testing/Procedures:  Your physician has recommended that you wear a 24 HOUR holter monitor. Holter monitors are medical devices that record the heart's electrical activity. Doctors most often use these monitors to diagnose arrhythmias. Arrhythmias are problems with the speed or rhythm of the heartbeat. The monitor is a small, portable device. You can wear one while you do your normal daily activities. This is usually used to diagnose what is causing palpitations/syncope (passing out).    Follow-Up:  Your physician wants you to follow-up in: [place] will receive a reminder letter in the mail two months in advance. If you don't receive a letter, please call our office to schedule the follow-up appointment.   If you need a refill on your cardiac medications before your next appointment, please call your pharmacy.

## 2018-01-28 ENCOUNTER — Ambulatory Visit (INDEPENDENT_AMBULATORY_CARE_PROVIDER_SITE_OTHER): Payer: Medicare HMO

## 2018-01-28 DIAGNOSIS — R001 Bradycardia, unspecified: Secondary | ICD-10-CM | POA: Diagnosis not present

## 2018-01-31 ENCOUNTER — Telehealth: Payer: Self-pay | Admitting: Cardiology

## 2018-01-31 ENCOUNTER — Telehealth: Payer: Self-pay | Admitting: Pulmonary Disease

## 2018-01-31 NOTE — Telephone Encounter (Signed)
Patient's son is questioning if patient will have to have new sleep study, etc.  Advised nurse will call back to discuss the next steps with him.

## 2018-01-31 NOTE — Telephone Encounter (Signed)
Per 01/31/18 phone note with cardiology, Dr [name] will be handling this  Nothing further needed Will route to SN to make him aware

## 2018-01-31 NOTE — Telephone Encounter (Signed)
New message     Pt son would like a call about cpap for his father. Please call.

## 2018-01-31 NOTE — Telephone Encounter (Signed)
Returned a call to patient's son, [name]. He informs me that the patient wants to retry using CPAP. He was informed the patient has to start over from the beginning with another sleep study. Son voiced understanding and will await for me to get insurance approval.

## 2018-02-01 ENCOUNTER — Telehealth: Payer: Self-pay | Admitting: *Deleted

## 2018-02-01 NOTE — Telephone Encounter (Signed)
SN aware.  Nothing further at this time. 

## 2018-02-01 NOTE — Telephone Encounter (Signed)
PA submitted to [employer] for in lab sleep study. °

## 2018-02-01 NOTE — Telephone Encounter (Signed)
-----   Message from Lauralee Evener, [place] sent at 01/31/2018  3:25 PM EDT ----- Sleep study

## 2018-02-08 ENCOUNTER — Telehealth: Payer: Self-pay | Admitting: *Deleted

## 2018-02-08 ENCOUNTER — Other Ambulatory Visit: Payer: Self-pay | Admitting: Cardiology

## 2018-02-08 DIAGNOSIS — G4719 Other hypersomnia: Secondary | ICD-10-CM

## 2018-02-08 DIAGNOSIS — G4733 Obstructive sleep apnea (adult) (pediatric): Secondary | ICD-10-CM

## 2018-02-08 DIAGNOSIS — R0683 Snoring: Secondary | ICD-10-CM

## 2018-02-08 NOTE — Telephone Encounter (Signed)
Left message to return a call to give sleep study appointment details. 

## 2018-02-08 NOTE — Telephone Encounter (Signed)
Patient's son Travis Bradford returned a call and was given sleep study appointment details.

## 2018-03-07 ENCOUNTER — Ambulatory Visit (HOSPITAL_BASED_OUTPATIENT_CLINIC_OR_DEPARTMENT_OTHER): Payer: Medicare HMO | Attending: Cardiology | Admitting: Cardiovascular Disease

## 2018-03-07 VITALS — Ht 68.0 in | Wt 220.0 lb

## 2018-03-07 DIAGNOSIS — E669 Obesity, unspecified: Secondary | ICD-10-CM | POA: Diagnosis present

## 2018-03-07 DIAGNOSIS — G4733 Obstructive sleep apnea (adult) (pediatric): Secondary | ICD-10-CM

## 2018-03-07 DIAGNOSIS — R5383 Other fatigue: Secondary | ICD-10-CM | POA: Diagnosis not present

## 2018-03-07 DIAGNOSIS — R400 Somnolence: Secondary | ICD-10-CM

## 2018-03-07 DIAGNOSIS — R0683 Snoring: Secondary | ICD-10-CM | POA: Diagnosis not present

## 2018-03-07 DIAGNOSIS — E6609 Other obesity due to excess calories: Secondary | ICD-10-CM | POA: Insufficient documentation

## 2018-03-07 DIAGNOSIS — Z6833 Body mass index (BMI) 33.0-33.9, adult: Secondary | ICD-10-CM | POA: Insufficient documentation

## 2018-03-07 DIAGNOSIS — G4719 Other hypersomnia: Secondary | ICD-10-CM

## 2018-03-18 ENCOUNTER — Encounter (HOSPITAL_BASED_OUTPATIENT_CLINIC_OR_DEPARTMENT_OTHER): Payer: Self-pay | Admitting: Cardiovascular Disease

## 2018-03-18 NOTE — Procedures (Signed)
Patient Name: Travis Bradford, Travis Bradford Date: 03/07/2018 Gender: Male D.O.B: Aug 10, 1925 Age (years): 89 Referring Provider: Kirk Bradford Height (inches): 62 Interpreting Physician: Travis Majestic MD, ABSM Weight (lbs): 220 RPSGT: Travis Bradford BMI: 33 MRN: [id number] Neck Size: 16.50  CLINICAL INFORMATION Sleep Study Type: NPSG  Indication for sleep study: Excessive Daytime Sleepiness, Fatigue, Obesity, OSA, Re-Evaluation, Snoring  Epworth Sleepiness Score: 6  Most recent polysomnogram dated 03/23/2016 revealed an AHI of 15.4/h and RDI of 19.0/h.  SLEEP STUDY TECHNIQUE As per the AASM Manual for the Scoring of Sleep and Associated Events v2.3 (April 2016) with a hypopnea requiring 4% desaturations.  The channels recorded and monitored were frontal, central and occipital EEG, electrooculogram (EOG), submentalis EMG (chin), nasal and oral airflow, thoracic and abdominal wall motion, anterior tibialis EMG, snore microphone, electrocardiogram, and pulse oximetry.  MEDICATIONS     apixaban (ELIQUIS) 5 MG TABS tablet         diltiazem (CARDIZEM CD) 120 MG 24 hr capsule         ergocalciferol (VITAMIN D2) 50000 UNITS capsule         fish oil-omega-3 fatty acids 1000 MG capsule         glucosamine-chondroitin 500-400 MG tablet         levothyroxine (SYNTHROID, LEVOTHROID) 100 MCG tablet         Methylsulfonylmethane (MSM) 500 MG CAPS         Multiple Vitamin (MULTIVITAMIN WITH MINERALS) TABS tablet         rOPINIRole (REQUIP) 3 MG tablet      Medications self-administered by patient taken the night of the study : Travis Bradford The study was initiated at 10:09:35 PM and ended at 4:08:09 AM.  Sleep onset time was 0.3 minutes and the sleep efficiency was 75.0%%. The total sleep time was 269 minutes.  Stage REM latency was 269.0 minutes.  The patient spent 23.8%% of the night in stage N1 sleep, 72.1%% in stage N2 sleep, 0.0%% in stage N3 and 4.1% in  REM.  Alpha intrusion was absent.  Supine sleep was 39.26%.  RESPIRATORY PARAMETERS The overall apnea/hypopnea index (AHI) was 37.2 per hour. There were 119 total apneas, including 116 obstructive, 2 central and 1 mixed apneas. There were 48 hypopneas and 2 RERAs.  The AHI during Stage REM sleep was 27.3 per hour.  AHI while supine was 65.3 per hour.  The mean oxygen saturation was 92.3%. The minimum SpO2 during sleep was 78.0%.  Loud snoring was noted during this study.  CARDIAC DATA The 2 lead EKG demonstrated atrial fibrillation. The mean heart rate was 90.3 beats per minute. Other EKG findings include: None.  LEG MOVEMENT DATA The total PLMS were 100 with a resulting PLMS index of 22.3. Associated arousal with leg movement index was 0.9 .  IMPRESSIONS - Severe obstructive sleep apnea occurred during this study (AHI 37.2/h). Events were very severe with supine sleep (AHI 65.3/h.) An attempt was made to initiate CPAP therapy; the patient was titrated up to 9 cm of water; AHI at 9 cm was 27.6/h. The patient refused to continue with CPAP and the diagnostic study was resumed.  - No significant central sleep apnea occurred during this study (CAI = 0.4/h). - Significant oxygen desaturation to a nadir of 78%. - The patient snored with loud snoring volume. - Frequent moaning and sleep talking throughout the study.  - No cardiac abnormalities were noted during this study. - Clinically significant periodic limb movements  did occur during sleep. No significant associated arousals. The patinet is on requip. [name]   DIAGNOSIS - Obstructive Sleep Apnea (327.23 [G47.33 ICD-10]) - Nocturnal Hypoxemia (327.26 [G47.36 ICD-10]) - Periodic Limb Movement disorder of sleep.   RECOMMENDATIONS - Therapeutic CPAP titration to determine optimal pressure required to alleviate sleep disordered breathing. If patient refuses to have the titration study, consider a trial of CPAP Auto with EPR of 3 at 10 - 20  cm of water.  - Efforts should be made to optimize nasal and pharyngeal patency.  - Positional therapy avoiding supine position during sleep. - Avoid alcohol, sedatives and other CNS depressants that may worsen sleep apnea and disrupt normal sleep architecture. - Sleep hygiene should be reviewed to assess factors that may improve sleep quality. - Weight management (BMI 33) and regular exercise should be initiated or continued if appropriate. - Recommend a download be obtained in 30 days if Auto PAP is initiated and sleep clinic evaluation.   [Electronically signed] 03/18/2018 03:11 PM  Travis Majestic MD, [place], American Board of Sleep Medicine   NPI: [id number] [place] PH: [phone]   FX: [phone] [place]

## 2018-03-23 ENCOUNTER — Other Ambulatory Visit: Payer: Self-pay | Admitting: Cardiovascular Disease

## 2018-03-23 ENCOUNTER — Telehealth: Payer: Self-pay | Admitting: *Deleted

## 2018-03-23 DIAGNOSIS — G4733 Obstructive sleep apnea (adult) (pediatric): Secondary | ICD-10-CM

## 2018-03-23 NOTE — Telephone Encounter (Signed)
Patient's son returned the call to discuss the sleep study. He voiced understanding of what was told to him. He states that he isn't surprised about the results, and that he and a neighbor who is a RT @ [employer] have been talking to the patient about sleep apnea and how crucial it is to treat it once diagnosed. He wants to try to "work on him" for a couple of days then get back with me about having the titration study. His dad has expressed to him that he just cannot tolerate wearing the mask.  The son states that he doesn't want [place] to go through the entire process only for the patient not to use the CPAP machine. I mentioned to the patient's son that there is a different therapy out called Dawna Part that is implanted by a ENT,however he will have to try and fail CPAP before insurance would cover it. This is something he can talk with Dr Claiborne Billings about if the CPAP doesn't work out for him. Son will call me back after talking with patient.

## 2018-03-23 NOTE — Telephone Encounter (Signed)
Left message to return a call to discuss sleep study. 

## 2018-03-23 NOTE — Telephone Encounter (Signed)
-----   Message from Troy Sine, MD sent at 03/18/2018  3:30 PM EDT ----- Mariann Laster, try to set up for CPAP titration, but if pt refuses then trial of Auto PAP 10 - 20 with EPR 3 and f/u sleep clinic

## 2018-03-25 ENCOUNTER — Telehealth: Payer: Self-pay | Admitting: *Deleted

## 2018-03-25 NOTE — Telephone Encounter (Signed)
-----   Message from Lauralee Evener, CMA sent at 03/23/2018 11:01 AM EDT ----- CPAP titration

## 2018-03-25 NOTE — Telephone Encounter (Signed)
PA submitted to [facility] via web portal for CPAP titration.

## 2018-03-30 ENCOUNTER — Telehealth: Payer: Self-pay | Admitting: *Deleted

## 2018-03-30 NOTE — Progress Notes (Signed)
Patient's son returned a call to me informing me that he has tried to talk patient into OSA treatment, however he has declined.

## 2018-03-30 NOTE — Telephone Encounter (Signed)
Received a call from patient's son informing me that the patient has declined CPAP therapy at this time. Provider will be notified.

## 2018-03-30 NOTE — Telephone Encounter (Signed)
-----   Message from Lauralee Evener, CMA sent at 03/23/2018 11:01 AM EDT ----- CPAP titration

## 2018-04-20 DIAGNOSIS — Z85828 Personal history of other malignant neoplasm of skin: Secondary | ICD-10-CM | POA: Diagnosis not present

## 2018-04-20 DIAGNOSIS — D2262 Melanocytic nevi of left upper limb, including shoulder: Secondary | ICD-10-CM | POA: Diagnosis not present

## 2018-04-20 DIAGNOSIS — D2261 Melanocytic nevi of right upper limb, including shoulder: Secondary | ICD-10-CM | POA: Diagnosis not present

## 2018-04-20 DIAGNOSIS — L570 Actinic keratosis: Secondary | ICD-10-CM | POA: Diagnosis not present

## 2018-04-20 DIAGNOSIS — D225 Melanocytic nevi of trunk: Secondary | ICD-10-CM | POA: Diagnosis not present

## 2018-04-20 DIAGNOSIS — L281 Prurigo nodularis: Secondary | ICD-10-CM | POA: Diagnosis not present

## 2018-04-20 DIAGNOSIS — L821 Other seborrheic keratosis: Secondary | ICD-10-CM | POA: Diagnosis not present

## 2018-04-20 DIAGNOSIS — C44529 Squamous cell carcinoma of skin of other part of trunk: Secondary | ICD-10-CM | POA: Diagnosis not present

## 2018-04-20 DIAGNOSIS — L814 Other melanin hyperpigmentation: Secondary | ICD-10-CM | POA: Diagnosis not present

## 2018-05-02 DIAGNOSIS — Z85828 Personal history of other malignant neoplasm of skin: Secondary | ICD-10-CM | POA: Diagnosis not present

## 2018-05-02 DIAGNOSIS — C44529 Squamous cell carcinoma of skin of other part of trunk: Secondary | ICD-10-CM | POA: Diagnosis not present

## 2018-05-02 DIAGNOSIS — C44722 Squamous cell carcinoma of skin of right lower limb, including hip: Secondary | ICD-10-CM | POA: Diagnosis not present

## 2018-05-17 DIAGNOSIS — R69 Illness, unspecified: Secondary | ICD-10-CM | POA: Diagnosis not present

## 2018-06-14 DIAGNOSIS — L905 Scar conditions and fibrosis of skin: Secondary | ICD-10-CM | POA: Diagnosis not present

## 2018-06-14 DIAGNOSIS — Z85828 Personal history of other malignant neoplasm of skin: Secondary | ICD-10-CM | POA: Diagnosis not present

## 2018-07-06 DIAGNOSIS — R3914 Feeling of incomplete bladder emptying: Secondary | ICD-10-CM | POA: Diagnosis not present

## 2018-07-06 DIAGNOSIS — R3121 Asymptomatic microscopic hematuria: Secondary | ICD-10-CM | POA: Diagnosis not present

## 2018-07-06 DIAGNOSIS — N3946 Mixed incontinence: Secondary | ICD-10-CM | POA: Diagnosis not present

## 2018-07-21 DIAGNOSIS — Z85828 Personal history of other malignant neoplasm of skin: Secondary | ICD-10-CM | POA: Diagnosis not present

## 2018-07-21 DIAGNOSIS — L720 Epidermal cyst: Secondary | ICD-10-CM | POA: Diagnosis not present

## 2018-07-21 DIAGNOSIS — C44722 Squamous cell carcinoma of skin of right lower limb, including hip: Secondary | ICD-10-CM | POA: Diagnosis not present

## 2018-07-21 DIAGNOSIS — L281 Prurigo nodularis: Secondary | ICD-10-CM | POA: Diagnosis not present

## 2018-08-06 DIAGNOSIS — I4891 Unspecified atrial fibrillation: Secondary | ICD-10-CM | POA: Diagnosis not present

## 2018-08-06 DIAGNOSIS — E039 Hypothyroidism, unspecified: Secondary | ICD-10-CM | POA: Diagnosis not present

## 2018-08-06 DIAGNOSIS — K219 Gastro-esophageal reflux disease without esophagitis: Secondary | ICD-10-CM | POA: Diagnosis not present

## 2018-08-06 DIAGNOSIS — E669 Obesity, unspecified: Secondary | ICD-10-CM | POA: Diagnosis not present

## 2018-08-06 DIAGNOSIS — Z6833 Body mass index (BMI) 33.0-33.9, adult: Secondary | ICD-10-CM | POA: Diagnosis not present

## 2018-08-06 DIAGNOSIS — C8590 Non-Hodgkin lymphoma, unspecified, unspecified site: Secondary | ICD-10-CM | POA: Diagnosis not present

## 2018-08-06 DIAGNOSIS — Z7901 Long term (current) use of anticoagulants: Secondary | ICD-10-CM | POA: Diagnosis not present

## 2018-08-06 DIAGNOSIS — G2581 Restless legs syndrome: Secondary | ICD-10-CM | POA: Diagnosis not present

## 2018-08-06 DIAGNOSIS — R32 Unspecified urinary incontinence: Secondary | ICD-10-CM | POA: Diagnosis not present

## 2018-08-06 DIAGNOSIS — Z823 Family history of stroke: Secondary | ICD-10-CM | POA: Diagnosis not present

## 2018-08-10 DIAGNOSIS — C44722 Squamous cell carcinoma of skin of right lower limb, including hip: Secondary | ICD-10-CM | POA: Diagnosis not present

## 2018-08-10 DIAGNOSIS — Z85828 Personal history of other malignant neoplasm of skin: Secondary | ICD-10-CM | POA: Diagnosis not present

## 2018-08-17 DIAGNOSIS — R3121 Asymptomatic microscopic hematuria: Secondary | ICD-10-CM | POA: Diagnosis not present

## 2018-08-17 DIAGNOSIS — N401 Enlarged prostate with lower urinary tract symptoms: Secondary | ICD-10-CM | POA: Diagnosis not present

## 2018-08-17 DIAGNOSIS — N3946 Mixed incontinence: Secondary | ICD-10-CM | POA: Diagnosis not present

## 2018-08-17 DIAGNOSIS — N281 Cyst of kidney, acquired: Secondary | ICD-10-CM | POA: Diagnosis not present

## 2018-08-17 NOTE — Progress Notes (Signed)
Follow-up atrial fibrillation. Stress echocardiogram January 2014 showed hypokinesis of the mid and distal inferior wall suggestive of ischemia. We treated medically. Patient had a CT of his neck in January 2017 that showed calcium in both carotidsandcoronaries. Echocardiogram July 2017 showed ejection fraction 40-45%with hypokinesis of the basal and mid inferior lateral and inferior myocardium. Mild biatrial enlargement, mild right ventricular enlargement and mild mitral regurgitation. Patient seen for new onset atrial fibrillation in July 2017.Patient placed on metoprolol and anticoagulation. Metoprolol subsequent change to Cardizem because of fatigue. Echocardiogram repeated September 2018 and showed normal LV function, mildly dilated aortic root and ascending aorta, mild left atrial enlargement and mild right ventricular enlargement.  Holter monitor July 2019 showed atrial fibrillation rate reasonably well controlled.  No pauses. Since last seen  patient denies dyspnea, chest pain, palpitations, syncope or bleeding.  Current Outpatient Medications  Medication Sig Dispense Refill  . apixaban (ELIQUIS) 5 MG TABS tablet Take 1 tablet (5 mg total) by mouth 2 (two) times daily. 180 tablet 3  . diltiazem (CARDIZEM CD) 120 MG 24 hr capsule Take 1 capsule (120 mg total) by mouth daily. 30 capsule 6  . ergocalciferol (VITAMIN D2) 50000 UNITS capsule Take 50,000 Units by mouth once a week.    . fish oil-omega-3 fatty acids 1000 MG capsule Take 2 g by mouth daily.    [name] glucosamine-chondroitin 500-400 MG tablet Take 1 tablet by mouth daily.    [name] levothyroxine (SYNTHROID, LEVOTHROID) 100 MCG tablet Take 100 mcg by mouth daily.    . Methylsulfonylmethane (MSM) 500 MG CAPS Take 1 capsule by mouth daily.    . Multiple Vitamin (MULTIVITAMIN WITH MINERALS) TABS tablet Take 1 tablet by mouth daily. Reported on 08/06/2015    . rOPINIRole (REQUIP) 3 MG tablet Take 5 mg by mouth at bedtime.      No current  facility-administered medications for this visit.      Past Medical History:  Diagnosis Date  . Abnormal stress test    a. 07/2012: abnormal stress echo showing hypokinesis of the mid-distal inferior wall suggestive of ischemia; + frequent PVCs, no ST changes or CP. Dr. [name] had a long discussion with the patient about further w/u versus medical therapy and the patient elected medical therapy..  . Arthritis   . Atrial fibrillation ([place])    a. Dx 01/2016.  [name] BPH (benign prostatic hyperplasia)   . Diabetes mellitus, type 2 ([place])   . History of blood transfusion   . Hyperlipidemia   . Hypothyroid   . Lymphoma ([place])    15 years ago  . Pneumonia   . Prostate cancer ([place])   . PVC's (premature ventricular contractions)    a. 07/2012 - seen on stress echo.  [name] RBBB    a. h/o intermittent rbbb.  . Sinus bradycardia     Past Surgical History:  Procedure Laterality Date  . APPENDECTOMY    . Athroscopic knee surgery    . dental implants    . GREEN LIGHT LASER TURP (TRANSURETHRAL RESECTION OF PROSTATE  08/29/2012   Procedure: GREEN LIGHT LASER TURP (TRANSURETHRAL RESECTION OF PROSTATE;  Surgeon: [name], MD;  Location: [facility];  Service: Urology;  Laterality: N/A;  . MOHS SURGERY     x 6    Social History   Socioeconomic History  . Marital status: Widowed    Spouse name: Not on file  . Number of children: 2  . Years of education: Not on  file  . Highest education level: Not on file  Occupational History  . Not on file  Social Needs  . Financial resource strain: Not on file  . Food insecurity:    Worry: Not on file    Inability: Not on file  . Transportation needs:    Medical: Not on file    Non-medical: Not on file  Tobacco Use  . Smoking status: Former Research scientist (life sciences)  . Smokeless tobacco: Never Used  Substance and Sexual Activity  . Alcohol use: Yes    Comment: Rarely  . Drug use: Not on file  . Sexual activity: Not on file  Lifestyle  . Physical activity:     Days per week: Not on file    Minutes per session: Not on file  . Stress: Not on file  Relationships  . Social connections:    Talks on phone: Not on file    Gets together: Not on file    Attends religious service: Not on file    Active member of club or organization: Not on file    Attends meetings of clubs or organizations: Not on file    Relationship status: Not on file  . Intimate partner violence:    Fear of current or ex partner: Not on file    Emotionally abused: Not on file    Physically abused: Not on file    Forced sexual activity: Not on file  Other Topics Concern  . Not on file  Social History Narrative  . Not on file    Family History  Problem Relation Age of Onset  . CAD Mother   . Stroke Brother         X 2    ROS: Fatigue but no fevers or chills, productive cough, hemoptysis, dysphasia, odynophagia, melena, hematochezia, dysuria, hematuria, rash, seizure activity, orthopnea, PND, pedal edema, claudication. Remaining systems are negative.  Physical Exam: Well-developed well-nourished in no acute distress.  Skin is warm and dry.  HEENT is normal.  Neck is supple.  Chest is clear to auscultation with normal expansion.  Cardiovascular exam is irregular Abdominal exam nontender or distended. No masses palpated. Extremities show no edema. neuro grossly intact  ECG-atrial fibrillation at a rate of 92, left anterior fascicular block, right bundle branch block.  Personally reviewed  A/P  1 Atrial fibrillation-Plan to continue cardizem for rate control; continue apixaban.  Check hemoglobin and renal function.  2 history of abnormal functional study-there is no increased chest pain or dyspnea.  We have elected to treat this conservatively.  3 hyperlipidemia-managed by primary care.  4 history of mild cardiomyopathy-patient's LV function has improved on most recent echocardiogram.  Question if previous mild reduction related to tachycardia.  Continue Cardizem.   He did not tolerate beta-blockers in the past.  5 obstructive sleep apnea-managed by Dr. Claiborne Billings.  [name], MD

## 2018-08-29 ENCOUNTER — Ambulatory Visit (INDEPENDENT_AMBULATORY_CARE_PROVIDER_SITE_OTHER): Payer: Medicare HMO | Admitting: Cardiology

## 2018-08-29 ENCOUNTER — Encounter: Payer: Self-pay | Admitting: Cardiology

## 2018-08-29 VITALS — BP 132/70 | HR 92 | Ht 69.0 in | Wt 222.0 lb

## 2018-08-29 DIAGNOSIS — E7800 Pure hypercholesterolemia, unspecified: Secondary | ICD-10-CM

## 2018-08-29 DIAGNOSIS — I4819 Other persistent atrial fibrillation: Secondary | ICD-10-CM | POA: Diagnosis not present

## 2018-08-29 NOTE — Patient Instructions (Signed)
Medication Instructions:  NO CHANGE If you need a refill on your cardiac medications before your next appointment, please call your pharmacy.   Lab work: Your physician recommends that you HAVE LAB WORK TODAY If you have labs (blood work) drawn today and your tests are completely normal, you will receive your results only by: [name] MyChart Message (if you have MyChart) OR . A paper copy in the mail If you have any lab test that is abnormal or we need to change your treatment, we will call you to review the results.  Follow-Up: At [facility], you and your health needs are our priority.  As part of our continuing mission to provide you with exceptional heart care, we have created designated Provider Care Teams.  These Care Teams include your primary Cardiologist (physician) and Advanced Practice Providers (APPs -  Physician Assistants and Nurse Practitioners) who all work together to provide you with the care you need, when you need it. You will need a follow up appointment in 6 months.  Please call our office 2 months in advance to schedule this appointment.  You may see [name] MD or one of the following Advanced Practice Providers on your designated Care Team:   Kerin Ransom, PA-C Roby Lofts, [place] . Sande Rives, PA-C CALL IN June TO SCHEDULE A[PPOINTMENT IN [place]

## 2018-08-30 ENCOUNTER — Encounter: Payer: Self-pay | Admitting: *Deleted

## 2018-08-30 LAB — CBC
Hematocrit: 41.5 % (ref 37.5–51.0)
Hemoglobin: 13.5 g/dL (ref 13.0–17.7)
MCH: 31.5 pg (ref 26.6–33.0)
MCHC: 32.5 g/dL (ref 31.5–35.7)
MCV: 97 fL (ref 79–97)
PLATELETS: 336 10*3/uL (ref 150–450)
RBC: 4.29 x10E6/uL (ref 4.14–5.80)
RDW: 12.4 % (ref 11.6–15.4)
WBC: 10.5 10*3/uL (ref 3.4–10.8)

## 2018-08-30 LAB — BASIC METABOLIC PANEL
BUN/Creatinine Ratio: 26 — ABNORMAL HIGH (ref 10–24)
BUN: 25 mg/dL (ref 10–36)
CALCIUM: 9.7 mg/dL (ref 8.6–10.2)
CHLORIDE: 99 mmol/L (ref 96–106)
CO2: 23 mmol/L (ref 20–29)
Creatinine, Ser: 0.95 mg/dL (ref 0.76–1.27)
GFR calc non Af Amer: 70 mL/min/{1.73_m2} (ref >59)
GFR, EST AFRICAN AMERICAN: 81 mL/min/{1.73_m2} (ref >59)
Glucose: 107 mg/dL — ABNORMAL HIGH (ref 65–99)
POTASSIUM: 5.1 mmol/L (ref 3.5–5.2)
Sodium: 139 mmol/L (ref 134–144)

## 2018-11-29 ENCOUNTER — Telehealth: Payer: Self-pay | Admitting: Cardiology

## 2018-11-29 NOTE — Telephone Encounter (Signed)
  Travis Bradford is calling regarding his dad and he would like to speak to the nurse because his dad has cut his toe and he is on Eliquis. His question is how long can he be off Eliquis safely. He also needs to discuss a letter he needs for some legal issues.

## 2018-11-29 NOTE — Telephone Encounter (Signed)
Patient is on Eliquis- he trimmed his toenail, and now cut his toe open and has bled for a while on Saturday, they would like to know if his dad should come off the Eliquis for a few days. It has stopped bleeding for now, and but if he takes the bandage off it begins to bleed again.   Son sent in a letter that he can not do any types of legal (court) he needs a letter that he is responsible for the patient and unable to leave his dad to go to jury duty as he has dementia. He is wanting Dr.Crenshaw to write him a letter if possible. He states he can call him if he has questions.   I advised I would route medication question to PharmD, and Dr.Crenshaw for the letter question.   Thanks!

## 2018-11-29 NOTE — Telephone Encounter (Signed)
He will need to get note for court from his primary care [name]

## 2018-11-29 NOTE — Telephone Encounter (Signed)
*  Patient may hold his Eliquis for 24 hours (2 doses). Keep wound clear and covered for now. Keep leg elevated as well.  Due to hx of diabetes and cellulitis , I encouraged patient to call PCP for further recommendation about wound care.

## 2018-11-29 NOTE — Telephone Encounter (Signed)
Called patient, LVM on sons voicemail regarding the medication from PharmD.   Left call back number.

## 2018-11-30 ENCOUNTER — Encounter: Payer: Self-pay | Admitting: *Deleted

## 2018-11-30 NOTE — Telephone Encounter (Signed)
Spoke with pt son, Aware of dr Jacalyn Lefevre recommendations. The patient does not have a primary care doctor and the son would like dr [name] to call him. Will forward for dr [name] review

## 2018-11-30 NOTE — Telephone Encounter (Signed)
Left message for son, letter generated and mailed to the home address of the patient.

## 2018-11-30 NOTE — Telephone Encounter (Signed)
Pt needs primary care doctor; we typically do not give notes for family members Travis Bradford

## 2018-12-13 ENCOUNTER — Telehealth: Payer: Self-pay | Admitting: Cardiology

## 2018-12-13 NOTE — Telephone Encounter (Signed)
New Message   Patient's son states they never received the letter and wants to know if he can come pick one up please give him a call

## 2018-12-13 NOTE — Telephone Encounter (Signed)
Returned call to patient's son advised letter left at [employer] office front desk.

## 2019-01-12 DIAGNOSIS — L309 Dermatitis, unspecified: Secondary | ICD-10-CM | POA: Diagnosis not present

## 2019-01-12 DIAGNOSIS — Z85828 Personal history of other malignant neoplasm of skin: Secondary | ICD-10-CM | POA: Diagnosis not present

## 2019-01-31 DIAGNOSIS — L304 Erythema intertrigo: Secondary | ICD-10-CM | POA: Diagnosis not present

## 2019-01-31 DIAGNOSIS — Z85828 Personal history of other malignant neoplasm of skin: Secondary | ICD-10-CM | POA: Diagnosis not present

## 2019-02-15 ENCOUNTER — Telehealth: Payer: Self-pay

## 2019-02-15 MED ORDER — DILTIAZEM HCL ER COATED BEADS 120 MG PO CP24: 120.0000 mg | ORAL_CAPSULE | Freq: Every day | ORAL | 6 refills | Status: DC

## 2019-02-15 NOTE — Telephone Encounter (Signed)
Received a call from patient's son [name] calling to verify if father is on Diltiazem.Stated father has dementia and he is helping him with his medications.Advised he is suppose to take Diltiazem 120 mg daily.Stated he will need a refill sent in local until he gets from [place].Refill sent to pharmacy.

## 2019-02-21 ENCOUNTER — Other Ambulatory Visit: Payer: Self-pay | Admitting: *Deleted

## 2019-02-21 MED ORDER — DILTIAZEM HCL ER COATED BEADS 120 MG PO CP24: 120.0000 mg | ORAL_CAPSULE | Freq: Every day | ORAL | 3 refills | Status: DC

## 2019-04-14 NOTE — Progress Notes (Signed)
HPI: Follow-up atrial fibrillation. Stress echocardiogram January 2014 showed hypokinesis of the mid and distal inferior wall suggestive of ischemia. We treated medically. Patient had a CT of his neck in January 2017 that showed calcium in both carotidsandcoronaries. Echocardiogram July 2017 showed ejection fraction 40-45%with hypokinesis of the basal and mid inferior lateral and inferior myocardium. Mild biatrial enlargement, mild right ventricular enlargement and mild mitral regurgitation. Patient seen for new onset atrial fibrillation in July 2017.Patient placed on metoprolol and anticoagulation. Metoprolol subsequent change to Cardizem because of fatigue. Echocardiogram repeated September 2018 and showed normal LV function, mildly dilated aortic root and ascending aorta, mild left atrial enlargement and mild right ventricular enlargement.  Holter monitor July 2019 showed atrial fibrillation rate reasonably well controlled.  No pauses.Since last seenpatient denies dyspnea, chest pain, palpitations, syncope or bleeding.  Current Outpatient Medications  Medication Sig Dispense Refill   apixaban (ELIQUIS) 5 MG TABS tablet Take 1 tablet (5 mg total) by mouth 2 (two) times daily. 180 tablet 3   diltiazem (CARDIZEM CD) 120 MG 24 hr capsule Take 1 capsule (120 mg total) by mouth daily. 90 capsule 3   ergocalciferol (VITAMIN D2) 50000 UNITS capsule Take 50,000 Units by mouth once a week.     fish oil-omega-3 fatty acids 1000 MG capsule Take 2 g by mouth daily.     glucosamine-chondroitin 500-400 MG tablet Take 1 tablet by mouth daily.     levothyroxine (SYNTHROID, LEVOTHROID) 100 MCG tablet Take 100 mcg by mouth daily.     Methylsulfonylmethane (MSM) 500 MG CAPS Take 1 capsule by mouth daily.     Multiple Vitamin (MULTIVITAMIN WITH MINERALS) TABS tablet Take 1 tablet by mouth daily. Reported on 08/06/2015     rOPINIRole (REQUIP) 3 MG tablet Take 5 mg by mouth at bedtime.      No  current facility-administered medications for this visit.      Past Medical History:  Diagnosis Date   Abnormal stress test    a. 07/2012: abnormal stress echo showing hypokinesis of the mid-distal inferior wall suggestive of ischemia; + frequent PVCs, no ST changes or CP. Dr. [name] had a long discussion with the patient about further w/u versus medical therapy and the patient elected medical therapy..   Arthritis    Atrial fibrillation ([place])    a. Dx 01/2016.   BPH (benign prostatic hyperplasia)    Diabetes mellitus, type 2 ([place])    History of blood transfusion    Hyperlipidemia    Hypothyroid    Lymphoma ([place])    15 years ago   Pneumonia    Prostate cancer ([place])    PVC's (premature ventricular contractions)    a. 07/2012 - seen on stress echo.   RBBB    a. h/o intermittent rbbb.   Sinus bradycardia     Past Surgical History:  Procedure Laterality Date   APPENDECTOMY     Athroscopic knee surgery     dental implants     GREEN LIGHT LASER TURP (TRANSURETHRAL RESECTION OF PROSTATE  08/29/2012   Procedure: GREEN LIGHT LASER TURP (TRANSURETHRAL RESECTION OF PROSTATE;  Surgeon: [name], MD;  Location: [facility];  Service: Urology;  Laterality: N/A;   MOHS SURGERY     x 6    Social History   Socioeconomic History   Marital status: Widowed    Spouse name: Not on file   Number of children: 2   Years of education: Not on file  Highest education level: Not on file  Occupational History   Not on file  Social Needs   Financial resource strain: Not on file   Food insecurity    Worry: Not on file    Inability: Not on file   Transportation needs    Medical: Not on file    Non-medical: Not on file  Tobacco Use   Smoking status: Former Smoker   Smokeless tobacco: Never Used  Substance and Sexual Activity   Alcohol use: Yes    Comment: Rarely   Drug use: Not on file   Sexual activity: Not on file  Lifestyle   Physical activity     Days per week: Not on file    Minutes per session: Not on file   Stress: Not on file  Relationships   Social connections    Talks on phone: Not on file    Gets together: Not on file    Attends religious service: Not on file    Active member of club or organization: Not on file    Attends meetings of clubs or organizations: Not on file    Relationship status: Not on file   Intimate partner violence    Fear of current or ex partner: Not on file    Emotionally abused: Not on file    Physically abused: Not on file    Forced sexual activity: Not on file  Other Topics Concern   Not on file  Social History Narrative   Not on file    Family History  Problem Relation Age of Onset   CAD Mother    Stroke Brother         X 2    ROS: no fevers or chills, productive cough, hemoptysis, dysphasia, odynophagia, melena, hematochezia, dysuria, hematuria, rash, seizure activity, orthopnea, PND, pedal edema, claudication. Remaining systems are negative.  Physical Exam: Well-developed well-nourished in no acute distress.  Skin is warm and dry.  HEENT is normal.  Neck is supple.  Chest is clear to auscultation with normal expansion.  Cardiovascular exam is regular rate and rhythm.  Abdominal exam nontender or distended. No masses palpated. Extremities show no edema. neuro grossly intact  A/P  1 permanent atrial fibrillation-continue Cardizem at present dose.  Continue apixaban.  2 history of abnormal functional study-patient denies chest pain or increased dyspnea.  I am trying to be conservative given patient's age.  Continue medical therapy.  3 hyperlipidemia-followed by primary care.  4 history of mild cardiomyopathy-LV function improved on most recent study.  Continue Cardizem as previous reduction may have been tachycardia mediated.  Patient did not tolerate beta blockade previously.  5 history of obstructive sleep apnea-followed by Dr. Claiborne Billings.  [name], MD

## 2019-04-17 ENCOUNTER — Other Ambulatory Visit: Payer: Self-pay

## 2019-04-17 ENCOUNTER — Encounter

## 2019-04-17 ENCOUNTER — Encounter: Payer: Self-pay | Admitting: Cardiology

## 2019-04-17 ENCOUNTER — Ambulatory Visit: Payer: Medicare HMO | Admitting: Cardiology

## 2019-04-17 VITALS — BP 100/50 | HR 73 | Ht 69.0 in | Wt 223.0 lb

## 2019-04-17 DIAGNOSIS — E7800 Pure hypercholesterolemia, unspecified: Secondary | ICD-10-CM

## 2019-04-17 DIAGNOSIS — I4819 Other persistent atrial fibrillation: Secondary | ICD-10-CM

## 2019-04-17 DIAGNOSIS — R9439 Abnormal result of other cardiovascular function study: Secondary | ICD-10-CM | POA: Diagnosis not present

## 2019-04-17 NOTE — Patient Instructions (Signed)

## 2019-05-03 DIAGNOSIS — R69 Illness, unspecified: Secondary | ICD-10-CM | POA: Diagnosis not present

## 2019-06-08 DIAGNOSIS — Z85828 Personal history of other malignant neoplasm of skin: Secondary | ICD-10-CM | POA: Diagnosis not present

## 2019-06-08 DIAGNOSIS — L821 Other seborrheic keratosis: Secondary | ICD-10-CM | POA: Diagnosis not present

## 2019-06-08 DIAGNOSIS — L304 Erythema intertrigo: Secondary | ICD-10-CM | POA: Diagnosis not present

## 2019-06-13 ENCOUNTER — Ambulatory Visit (INDEPENDENT_AMBULATORY_CARE_PROVIDER_SITE_OTHER): Payer: Medicare HMO | Admitting: Otolaryngology

## 2019-08-02 ENCOUNTER — Telehealth: Payer: Self-pay | Admitting: Cardiology

## 2019-08-02 NOTE — Telephone Encounter (Signed)
Pts son is going to call the health Dept to make an appt for the pts COVID vaccine. Appt made for the pts yearly visit with Dr. [name] per his request for 08/29/19.

## 2019-08-02 NOTE — Telephone Encounter (Signed)
New Message    Pts son [name] is calling and says the pt does not have a primary care and is wondering if Dr [name] could recommend the pt to getting the COVID vaccine  He doesn't know who else he would go too.     Please advise

## 2019-08-23 NOTE — Progress Notes (Signed)
HPI: Follow-up atrial fibrillation. Stress echocardiogram January 2014 showed hypokinesis of the mid and distal inferior wall suggestive of ischemia. We treated medically. Patient had a CT of his neck in January 2017 that showed calcium in both carotidsandcoronaries. Echocardiogram July 2017 showed ejection fraction 40-45%with hypokinesis of the basal and mid inferior lateral and inferior myocardium. Mild biatrial enlargement, mild right ventricular enlargement and mild mitral regurgitation. Patient seen for new onset atrial fibrillation in July 2017.Patient placed on metoprolol and anticoagulation. Metoprolol subsequent change to Cardizem because of fatigue.Echocardiogram repeated September 2018 and showed normal LV function, mildly dilated aortic root and ascending aorta, mild left atrial enlargement and mild right ventricular enlargement.Holter monitor July 2019 showed atrial fibrillation rate reasonably well controlled. No pauses.Since last seenhe denies increased dyspnea, chest pain, palpitations or syncope.  Current Outpatient Medications  Medication Sig Dispense Refill  . apixaban (ELIQUIS) 5 MG TABS tablet Take 1 tablet (5 mg total) by mouth 2 (two) times daily. 180 tablet 3  . diltiazem (CARDIZEM CD) 120 MG 24 hr capsule Take 1 capsule (120 mg total) by mouth daily. 90 capsule 3  . ergocalciferol (VITAMIN D2) 50000 UNITS capsule Take 50,000 Units by mouth once a week.    . fish oil-omega-3 fatty acids 1000 MG capsule Take 2 g by mouth daily.    [name] glucosamine-chondroitin 500-400 MG tablet Take 1 tablet by mouth daily.    [name] levothyroxine (SYNTHROID, LEVOTHROID) 100 MCG tablet Take 100 mcg by mouth daily.    . Methylsulfonylmethane (MSM) 500 MG CAPS Take 1 capsule by mouth daily.    . Multiple Vitamin (MULTIVITAMIN WITH MINERALS) TABS tablet Take 1 tablet by mouth daily. Reported on 08/06/2015    . rOPINIRole (REQUIP) 3 MG tablet Take 5 mg by mouth at bedtime.      No current  facility-administered medications for this visit.     Past Medical History:  Diagnosis Date  . Abnormal stress test    a. 07/2012: abnormal stress echo showing hypokinesis of the mid-distal inferior wall suggestive of ischemia; + frequent PVCs, no ST changes or CP. Dr. [name] had a long discussion with the patient about further w/u versus medical therapy and the patient elected medical therapy..  . Arthritis   . Atrial fibrillation ([place])    a. Dx 01/2016.  [name] BPH (benign prostatic hyperplasia)   . Diabetes mellitus, type 2 ([place])   . History of blood transfusion   . Hyperlipidemia   . Hypothyroid   . Lymphoma ([place])    15 years ago  . Pneumonia   . Prostate cancer ([place])   . PVC's (premature ventricular contractions)    a. 07/2012 - seen on stress echo.  [name] RBBB    a. h/o intermittent rbbb.  . Sinus bradycardia     Past Surgical History:  Procedure Laterality Date  . APPENDECTOMY    . Athroscopic knee surgery    . dental implants    . GREEN LIGHT LASER TURP (TRANSURETHRAL RESECTION OF PROSTATE  08/29/2012   Procedure: GREEN LIGHT LASER TURP (TRANSURETHRAL RESECTION OF PROSTATE;  Surgeon: [name], MD;  Location: [facility];  Service: Urology;  Laterality: N/A;  . MOHS SURGERY     x 6    Social History   Socioeconomic History  . Marital status: Widowed    Spouse name: Not on file  . Number of children: 2  . Years of education: Not on file  . Highest education level: Not on  file  Occupational History  . Not on file  Tobacco Use  . Smoking status: Former Research scientist (life sciences)  . Smokeless tobacco: Never Used  Substance and Sexual Activity  . Alcohol use: Yes    Comment: Rarely  . Drug use: Not on file  . Sexual activity: Not on file  Other Topics Concern  . Not on file  Social History Narrative  . Not on file   Social Determinants of Health   Financial Resource Strain:   . Difficulty of Paying Living Expenses: Not on file  Food Insecurity:   . Worried About Ship broker in the Last Year: Not on file  . Ran Out of Food in the Last Year: Not on file  Transportation Needs:   . Lack of Transportation (Medical): Not on file  . Lack of Transportation (Non-Medical): Not on file  Physical Activity:   . Days of Exercise per Week: Not on file  . Minutes of Exercise per Session: Not on file  Stress:   . Feeling of Stress : Not on file  Social Connections:   . Frequency of Communication with Friends and Family: Not on file  . Frequency of Social Gatherings with Friends and Family: Not on file  . Attends Religious Services: Not on file  . Active Member of Clubs or Organizations: Not on file  . Attends Archivist Meetings: Not on file  . Marital Status: Not on file  Intimate Partner Violence:   . Fear of Current or Ex-Partner: Not on file  . Emotionally Abused: Not on file  . Physically Abused: Not on file  . Sexually Abused: Not on file    Family History  Problem Relation Age of Onset  . CAD Mother   . Stroke Brother         X 2    ROS: Depression but no fevers or chills, productive cough, hemoptysis, dysphasia, odynophagia, melena, hematochezia, dysuria, hematuria, rash, seizure activity, orthopnea, PND, pedal edema, claudication. Remaining systems are negative.  Physical Exam: Well-developed well-nourished in no acute distress.  Skin is warm and dry.  HEENT is normal.  Neck is supple.  Chest is clear to auscultation with normal expansion.  Cardiovascular exam is irregular Abdominal exam nontender or distended. No masses palpated. Extremities show no edema. neuro grossly intact  ECG-atrial fibrillation at a rate of 64, right bundle branch block, left anterior fascicular block.  Personally reviewed  A/P  1 permanent atrial fibrillation-continue Cardizem for rate control.  Continue apixaban.  Check hemoglobin and renal function.  2 hyperlipidemia-FU primary care  3 history of abnormal functional study-patient has not had chest  pain.  We have elected conservative measures given his age.  Continue medical therapy.  4 history of mild cardiomyopathy-improved on most recent echocardiogram.  Previous reduction possibly secondary to tachycardia.  He did not tolerate beta-blockade in the past.  Continue Cardizem.  5 history of obstructive sleep apnea-has refused CPAP in the past.  6 possible depression-patient's son feels he is depressed.  I have asked him to establish with a primary care physician for this and his other primary care needs.  [name], MD

## 2019-08-27 ENCOUNTER — Ambulatory Visit: Payer: Medicare HMO

## 2019-08-29 ENCOUNTER — Other Ambulatory Visit: Payer: Self-pay

## 2019-08-29 ENCOUNTER — Ambulatory Visit: Payer: Medicare HMO | Admitting: Cardiology

## 2019-08-29 ENCOUNTER — Encounter: Payer: Self-pay | Admitting: Cardiology

## 2019-08-29 VITALS — BP 124/80 | HR 64 | Temp 97.5°F | Ht 69.0 in | Wt 223.0 lb

## 2019-08-29 DIAGNOSIS — I4819 Other persistent atrial fibrillation: Secondary | ICD-10-CM | POA: Diagnosis not present

## 2019-08-29 DIAGNOSIS — G4733 Obstructive sleep apnea (adult) (pediatric): Secondary | ICD-10-CM | POA: Diagnosis not present

## 2019-08-29 DIAGNOSIS — E7800 Pure hypercholesterolemia, unspecified: Secondary | ICD-10-CM

## 2019-08-29 DIAGNOSIS — R9439 Abnormal result of other cardiovascular function study: Secondary | ICD-10-CM | POA: Diagnosis not present

## 2019-08-29 NOTE — Patient Instructions (Signed)
Medication Instructions:  NO CGHANGE *If you need a refill on your cardiac medications before your next appointment, please call your pharmacy*  Lab Work: Your physician recommends that you HAVE LAB WORK TODAY If you have labs (blood work) drawn today and your tests are completely normal, you will receive your results only by: [name] MyChart Message (if you have MyChart) OR . A paper copy in the mail If you have any lab test that is abnormal or we need to change your treatment, we will call you to review the results.  Follow-Up: At [facility], you and your health needs are our priority.  As part of our continuing mission to provide you with exceptional heart care, we have created designated Provider Care Teams.  These Care Teams include your primary Cardiologist (physician) and Advanced Practice Providers (APPs -  Physician Assistants and Nurse Practitioners) who all work together to provide you with the care you need, when you need it.  Your next appointment:   12 month(s)  The format for your next appointment:   Either In Person or Virtual  Provider:   You may see [name] MD or one of the following Advanced Practice Providers on your designated Care Team:    Kerin Ransom, [id number] Drucilla Schmidt  [place], [place]

## 2019-08-30 LAB — BASIC METABOLIC PANEL
BUN/Creatinine Ratio: 20 (ref 10–24)
BUN: 24 mg/dL (ref 10–36)
CO2: 22 mmol/L (ref 20–29)
Calcium: 9.4 mg/dL (ref 8.6–10.2)
Chloride: 104 mmol/L (ref 96–106)
Creatinine, Ser: 1.18 mg/dL (ref 0.76–1.27)
GFR calc Af Amer: 62 mL/min/{1.73_m2} (ref >59)
GFR calc non Af Amer: 54 mL/min/{1.73_m2} — ABNORMAL LOW (ref >59)
Glucose: 215 mg/dL — ABNORMAL HIGH (ref 65–99)
Potassium: 5 mmol/L (ref 3.5–5.2)
Sodium: 139 mmol/L (ref 134–144)

## 2019-08-30 LAB — CBC
Hematocrit: 38.7 % (ref 37.5–51.0)
Hemoglobin: 13 g/dL (ref 13.0–17.7)
MCH: 32.7 pg (ref 26.6–33.0)
MCHC: 33.6 g/dL (ref 31.5–35.7)
MCV: 98 fL — ABNORMAL HIGH (ref 79–97)
Platelets: 294 10*3/uL (ref 150–450)
RBC: 3.97 x10E6/uL — ABNORMAL LOW (ref 4.14–5.80)
RDW: 12 % (ref 11.6–15.4)
WBC: 8.8 10*3/uL (ref 3.4–10.8)

## 2019-09-01 ENCOUNTER — Ambulatory Visit: Payer: Medicare HMO

## 2019-09-04 ENCOUNTER — Ambulatory Visit: Payer: Medicare HMO

## 2019-09-19 ENCOUNTER — Other Ambulatory Visit: Payer: Self-pay

## 2019-09-19 ENCOUNTER — Ambulatory Visit (INDEPENDENT_AMBULATORY_CARE_PROVIDER_SITE_OTHER): Payer: Medicare HMO | Admitting: Otolaryngology

## 2019-09-19 VITALS — Temp 97.7°F

## 2019-09-19 DIAGNOSIS — Z8579 Personal history of other malignant neoplasms of lymphoid, hematopoietic and related tissues: Secondary | ICD-10-CM | POA: Diagnosis not present

## 2019-09-19 NOTE — Progress Notes (Signed)
HPI: Travis Bradford is a 86 y.o. male who presents for evaluation of blocked salivary glands.  He actually describes nodules in his lower lip that he feels with his finger.  He has had no real pain associated with these.  No swelling.  He pointed these out to me several years ago in 2017 and I recommended observation.  He apparently has had salivary stones removed previously.  He was last seen here in 2017 because of lymphadenitis.  Past Medical History:  Diagnosis Date  . Abnormal stress test    a. 07/2012: abnormal stress echo showing hypokinesis of the mid-distal inferior wall suggestive of ischemia; + frequent PVCs, no ST changes or CP. Dr. [name] had a long discussion with the patient about further w/u versus medical therapy and the patient elected medical therapy..  . Arthritis   . Atrial fibrillation ([place])    a. Dx 01/2016.  [name] BPH (benign prostatic hyperplasia)   . Diabetes mellitus, type 2 ([place])   . History of blood transfusion   . Hyperlipidemia   . Hypothyroid   . Lymphoma ([place])    15 years ago  . Pneumonia   . Prostate cancer ([place])   . PVC's (premature ventricular contractions)    a. 07/2012 - seen on stress echo.  [name] RBBB    a. h/o intermittent rbbb.  . Sinus bradycardia    Past Surgical History:  Procedure Laterality Date  . APPENDECTOMY    . Athroscopic knee surgery    . dental implants    . GREEN LIGHT LASER TURP (TRANSURETHRAL RESECTION OF PROSTATE  08/29/2012   Procedure: GREEN LIGHT LASER TURP (TRANSURETHRAL RESECTION OF PROSTATE;  Surgeon: [name], MD;  Location: [facility];  Service: Urology;  Laterality: N/A;  . MOHS SURGERY     x 6   Social History   Socioeconomic History  . Marital status: Widowed    Spouse name: Not on file  . Number of children: 2  . Years of education: Not on file  . Highest education level: Not on file  Occupational History  . Not on file  Tobacco Use  . Smoking status: Former Research scientist (life sciences)  . Smokeless tobacco: Never Used   Substance and Sexual Activity  . Alcohol use: Yes    Comment: Rarely  . Drug use: Not on file  . Sexual activity: Not on file  Other Topics Concern  . Not on file  Social History Narrative  . Not on file   Social Determinants of Health   Financial Resource Strain:   . Difficulty of Paying Living Expenses: Not on file  Food Insecurity:   . Worried About Charity fundraiser in the Last Year: Not on file  . Ran Out of Food in the Last Year: Not on file  Transportation Needs:   . Lack of Transportation (Medical): Not on file  . Lack of Transportation (Non-Medical): Not on file  Physical Activity:   . Days of Exercise per Week: Not on file  . Minutes of Exercise per Session: Not on file  Stress:   . Feeling of Stress : Not on file  Social Connections:   . Frequency of Communication with Friends and Family: Not on file  . Frequency of Social Gatherings with Friends and Family: Not on file  . Attends Religious Services: Not on file  . Active Member of Clubs or Organizations: Not on file  . Attends Archivist Meetings: Not on file  . Marital Status: Not  on file   Family History  Problem Relation Age of Onset  . CAD Mother   . Stroke Brother         X 2   No Known Allergies Prior to Admission medications   Medication Sig Start Date End Date Taking? Authorizing Provider  apixaban (ELIQUIS) 5 MG TABS tablet Take 1 tablet (5 mg total) by mouth 2 (two) times daily. 02/20/16  Yes Lelon Perla, MD  diltiazem (CARDIZEM CD) 120 MG 24 hr capsule Take 1 capsule (120 mg total) by mouth daily. 02/21/19  Yes Lelon Perla, MD  ergocalciferol (VITAMIN D2) 50000 UNITS capsule Take 50,000 Units by mouth once a week.   Yes [provider]  fish oil-omega-3 fatty acids 1000 MG capsule Take 2 g by mouth daily.   Yes [provider]  glucosamine-chondroitin 500-400 MG tablet Take 1 tablet by mouth daily.   Yes [provider]  levothyroxine (SYNTHROID,  LEVOTHROID) 100 MCG tablet Take 100 mcg by mouth daily.   Yes [provider]  Methylsulfonylmethane (MSM) 500 MG CAPS Take 1 capsule by mouth daily.   Yes [provider]  Multiple Vitamin (MULTIVITAMIN WITH MINERALS) TABS tablet Take 1 tablet by mouth daily. Reported on 08/06/2015   Yes [provider]  rOPINIRole (REQUIP) 3 MG tablet Take 5 mg by mouth at bedtime.    Yes [provider]     Positive ROS: Otherwise negative  All other systems have been reviewed and were otherwise negative with the exception of those mentioned in the HPI and as above.  Physical Exam: Constitutional: Alert, well-appearing, no acute distress Ears: External ears without lesions or tenderness. Ear canals are clear bilaterally with intact, clear TMs.  Nasal: External nose without lesions. Septum with mild deformity.  Clear nasal passages Oral: Oral and lip mucosa was normal to examination.  On palpation of the lower lip he does have several small 1 to 2 mm nodules that are firm but are not tender.  Could possibly be small stones in the minor salivary glands or fibrous changes.  But no evidence of neoplasia.  Floor mouth was clear and drainage from the submandibular ducts was clear bilaterally.  No palpable stones within the submandibular duct region. Neck: No palpable adenopathy or masses Respiratory: Breathing comfortably  Skin: No facial/neck lesions or rash noted.  Procedures  Assessment: Essentially normal oral lip examination.  Plan: Reassured patient and son of normal examination.  This was noted back in 2017.  On review of the CT scans at that time there was no evidence of calcifications within the lower lip.  Radene Journey, MD

## 2019-10-03 DIAGNOSIS — M25551 Pain in right hip: Secondary | ICD-10-CM | POA: Diagnosis not present

## 2019-10-04 ENCOUNTER — Other Ambulatory Visit: Payer: Self-pay | Admitting: Orthopedic Surgery

## 2019-10-04 DIAGNOSIS — T148XXA Other injury of unspecified body region, initial encounter: Secondary | ICD-10-CM

## 2019-10-05 ENCOUNTER — Other Ambulatory Visit: Payer: Self-pay

## 2019-10-05 ENCOUNTER — Other Ambulatory Visit: Payer: Self-pay | Admitting: Orthopedic Surgery

## 2019-10-05 ENCOUNTER — Ambulatory Visit
Admission: RE | Admit: 2019-10-05 | Discharge: 2019-10-05 | Disposition: A | Discharge status: Home / Self Care | Payer: Medicare HMO | Source: Ambulatory Visit | Attending: Orthopedic Surgery | Admitting: Orthopedic Surgery

## 2019-10-05 DIAGNOSIS — S7011XA Contusion of right thigh, initial encounter: Secondary | ICD-10-CM | POA: Diagnosis not present

## 2019-10-05 DIAGNOSIS — T148XXA Other injury of unspecified body region, initial encounter: Secondary | ICD-10-CM

## 2019-10-06 ENCOUNTER — Telehealth: Payer: Self-pay | Admitting: Cardiology

## 2019-10-06 NOTE — Telephone Encounter (Signed)
Pt son calling today to let Dr [name] that his father had a fall last week that resulted in a large hematoma on his upper thigh. He states it is painful and may require surgery to evacuate per Dr. Berenice Primas at [facility]. Aspiration was unsuccessful. CT was performed to assess size of hematoma; he unsure of the size.   His son states pt has a repeat visit for Monday March 15th where they will determine if surgery will be needed. He is asking if pt should continue eliquis and/or when he should stop before surgery. He states the orthopedic doctor did not mention if he should come off eliquis while hematoma is healing. Pt is in permant AF.   I informed pts son of surgical clearance request form and [facility]'s process for that. He understands. I educated him on s/s of increased bleeding in the area.

## 2019-10-06 NOTE — Telephone Encounter (Signed)
Continuation from previous note:   Pts son was encouraged to call MD on call if his dad had s/s of increased bleeding.   He verbalized understanding and had no additional questions.

## 2019-10-06 NOTE — Telephone Encounter (Signed)
New Message  Patient's son is calling in to speak with Dr. [name] or his nurse about patient coming off Eliquis for a surgery. Informed patient's son that we would need to have the orthopaedic surgeon's office to call in to provide information about the surgery. Patient's son [name]) still wants to speak with Dr. Jacalyn Lefevre nurse to explain what is going on. Please give patient's son a call back.

## 2019-10-09 ENCOUNTER — Telehealth: Payer: Self-pay | Admitting: Cardiology

## 2019-10-09 DIAGNOSIS — M79661 Pain in right lower leg: Secondary | ICD-10-CM | POA: Diagnosis not present

## 2019-10-09 DIAGNOSIS — M25551 Pain in right hip: Secondary | ICD-10-CM | POA: Diagnosis not present

## 2019-10-09 NOTE — Telephone Encounter (Signed)
Check and see if hematoma stable; if so, continue apixaban unless he starts falling frequently; DC apixaban 3 days prior to surgery and resume after when ok with orthopedics [name]

## 2019-10-09 NOTE — Telephone Encounter (Signed)
Dr. Berenice Primas stated that he had spoken with Dr. [name] and nothing else further was needed.

## 2019-10-09 NOTE — Telephone Encounter (Signed)
Dr. [name] would like to speak with a nurse ASAP in regards to this patient's eliquis for surgery. Please advise

## 2019-10-09 NOTE — Telephone Encounter (Signed)
Spoke with son and Dr Berenice Primas and Dr [name] spoke earlier today, nothing further to address at this time.

## 2019-10-10 ENCOUNTER — Other Ambulatory Visit: Payer: Self-pay | Admitting: Orthopedic Surgery

## 2019-10-10 ENCOUNTER — Encounter (HOSPITAL_COMMUNITY): Payer: Self-pay | Admitting: Orthopedic Surgery

## 2019-10-10 ENCOUNTER — Other Ambulatory Visit: Payer: Self-pay

## 2019-10-10 ENCOUNTER — Other Ambulatory Visit (HOSPITAL_COMMUNITY)
Admission: RE | Admit: 2019-10-10 | Discharge: 2019-10-10 | Disposition: A | Discharge status: Home / Self Care | Payer: Medicare HMO | Source: Ambulatory Visit | Attending: Orthopedic Surgery | Admitting: Orthopedic Surgery

## 2019-10-10 DIAGNOSIS — Z01812 Encounter for preprocedural laboratory examination: Secondary | ICD-10-CM | POA: Diagnosis not present

## 2019-10-10 DIAGNOSIS — Z20822 Contact with and (suspected) exposure to covid-19: Secondary | ICD-10-CM | POA: Diagnosis not present

## 2019-10-10 LAB — SARS CORONAVIRUS 2 (TAT 6-24 HRS): SARS Coronavirus 2: NEGATIVE

## 2019-10-10 NOTE — Progress Notes (Signed)
Spoke with pt's son, [name] for pre-op call. Pt has some forgetfulness, has not been diagnosed with dementia. Pt does have a hx of A-fib and is on Eliquis. Last dose was 10/09/19 in the AM. Dr. [name] is aware that pt is having surgery and gave the instructions on holding Eliquis. [name] states pt is not diabetic. He states pt has severe sleep apnea but refuses to wear a Cpap.  Covid test done today and [name] states pt is in quarantine since the test was done and understands he needs to continue until he comes to the hospital tomorrow.

## 2019-10-11 ENCOUNTER — Encounter (HOSPITAL_COMMUNITY): Payer: Self-pay | Admitting: Orthopedic Surgery

## 2019-10-11 ENCOUNTER — Other Ambulatory Visit: Payer: Self-pay

## 2019-10-11 ENCOUNTER — Encounter (HOSPITAL_COMMUNITY)
Admission: RE | Disposition: A | Discharge status: Home / Self Care | Payer: Self-pay | Source: Home / Self Care | Attending: Orthopedic Surgery

## 2019-10-11 ENCOUNTER — Observation Stay (HOSPITAL_COMMUNITY)
Admission: RE | Admit: 2019-10-11 | Discharge: 2019-10-12 | Disposition: A | Discharge status: Home / Self Care | Payer: Medicare HMO | Attending: Orthopedic Surgery | Admitting: Orthopedic Surgery

## 2019-10-11 ENCOUNTER — Ambulatory Visit (HOSPITAL_COMMUNITY): Payer: Medicare HMO | Admitting: Certified Registered Nurse Anesthetist

## 2019-10-11 DIAGNOSIS — G2581 Restless legs syndrome: Secondary | ICD-10-CM | POA: Insufficient documentation

## 2019-10-11 DIAGNOSIS — G4730 Sleep apnea, unspecified: Secondary | ICD-10-CM | POA: Diagnosis not present

## 2019-10-11 DIAGNOSIS — D638 Anemia in other chronic diseases classified elsewhere: Secondary | ICD-10-CM | POA: Diagnosis not present

## 2019-10-11 DIAGNOSIS — X58XXXA Exposure to other specified factors, initial encounter: Secondary | ICD-10-CM | POA: Insufficient documentation

## 2019-10-11 DIAGNOSIS — N400 Enlarged prostate without lower urinary tract symptoms: Secondary | ICD-10-CM | POA: Diagnosis not present

## 2019-10-11 DIAGNOSIS — Z87891 Personal history of nicotine dependence: Secondary | ICD-10-CM | POA: Diagnosis not present

## 2019-10-11 DIAGNOSIS — M7981 Nontraumatic hematoma of soft tissue: Secondary | ICD-10-CM | POA: Diagnosis not present

## 2019-10-11 DIAGNOSIS — I4891 Unspecified atrial fibrillation: Secondary | ICD-10-CM | POA: Diagnosis not present

## 2019-10-11 DIAGNOSIS — Z79899 Other long term (current) drug therapy: Secondary | ICD-10-CM | POA: Diagnosis not present

## 2019-10-11 DIAGNOSIS — E785 Hyperlipidemia, unspecified: Secondary | ICD-10-CM | POA: Diagnosis not present

## 2019-10-11 DIAGNOSIS — Z7901 Long term (current) use of anticoagulants: Secondary | ICD-10-CM | POA: Diagnosis not present

## 2019-10-11 DIAGNOSIS — S7011XA Contusion of right thigh, initial encounter: Principal | ICD-10-CM | POA: Diagnosis present

## 2019-10-11 DIAGNOSIS — R262 Difficulty in walking, not elsewhere classified: Secondary | ICD-10-CM | POA: Diagnosis not present

## 2019-10-11 DIAGNOSIS — M1990 Unspecified osteoarthritis, unspecified site: Secondary | ICD-10-CM | POA: Diagnosis not present

## 2019-10-11 DIAGNOSIS — R2689 Other abnormalities of gait and mobility: Secondary | ICD-10-CM | POA: Diagnosis not present

## 2019-10-11 DIAGNOSIS — E039 Hypothyroidism, unspecified: Secondary | ICD-10-CM | POA: Diagnosis not present

## 2019-10-11 DIAGNOSIS — Z9181 History of falling: Secondary | ICD-10-CM | POA: Diagnosis not present

## 2019-10-11 DIAGNOSIS — Z79890 Hormone replacement therapy (postmenopausal): Secondary | ICD-10-CM | POA: Insufficient documentation

## 2019-10-11 DIAGNOSIS — Z8249 Family history of ischemic heart disease and other diseases of the circulatory system: Secondary | ICD-10-CM | POA: Insufficient documentation

## 2019-10-11 HISTORY — PX: HEMATOMA EVACUATION: SHX5118

## 2019-10-11 HISTORY — DX: Sleep apnea, unspecified: G47.30

## 2019-10-11 HISTORY — DX: Restless legs syndrome: G25.81

## 2019-10-11 LAB — BASIC METABOLIC PANEL
Anion gap: 11 (ref 5–15)
BUN: 22 mg/dL (ref 8–23)
CO2: 23 mmol/L (ref 22–32)
Calcium: 9.2 mg/dL (ref 8.9–10.3)
Chloride: 100 mmol/L (ref 98–111)
Creatinine, Ser: 0.96 mg/dL (ref 0.61–1.24)
GFR calc Af Amer: >60 mL/min (ref >60)
GFR calc non Af Amer: >60 mL/min (ref >60)
Glucose, Bld: 134 mg/dL — ABNORMAL HIGH (ref 70–99)
Potassium: 4.2 mmol/L (ref 3.5–5.1)
Sodium: 134 mmol/L — ABNORMAL LOW (ref 135–145)

## 2019-10-11 LAB — CBC
HCT: 37.2 % — ABNORMAL LOW (ref 39.0–52.0)
Hemoglobin: 12.1 g/dL — ABNORMAL LOW (ref 13.0–17.0)
MCH: 33.4 pg (ref 26.0–34.0)
MCHC: 32.5 g/dL (ref 30.0–36.0)
MCV: 102.8 fL — ABNORMAL HIGH (ref 80.0–100.0)
Platelets: 359 10*3/uL (ref 150–400)
RBC: 3.62 MIL/uL — ABNORMAL LOW (ref 4.22–5.81)
RDW: 13 % (ref 11.5–15.5)
WBC: 10.5 10*3/uL (ref 4.0–10.5)
nRBC: 0 % (ref 0.0–0.2)

## 2019-10-11 LAB — GLUCOSE, CAPILLARY
Glucose-Capillary: 115 mg/dL — ABNORMAL HIGH (ref 70–99)
Glucose-Capillary: 115 mg/dL — ABNORMAL HIGH (ref 70–99)
Glucose-Capillary: 117 mg/dL — ABNORMAL HIGH (ref 70–99)

## 2019-10-11 SURGERY — EVACUATION HEMATOMA
Anesthesia: General | Laterality: Right

## 2019-10-11 MED ORDER — CEFAZOLIN SODIUM-DEXTROSE 2-3 GM-%(50ML) IV SOLR
INTRAVENOUS | Status: DC | PRN
  Administered 2019-10-11: 2 g via INTRAVENOUS

## 2019-10-11 MED ORDER — FENTANYL CITRATE (PF) 100 MCG/2ML IJ SOLN
INTRAMUSCULAR | Status: AC
  Filled 2019-10-11: qty 2

## 2019-10-11 MED ORDER — OXYCODONE HCL 5 MG/5ML PO SOLN: 5.0000 mg | Freq: Once | ORAL | Status: DC | PRN

## 2019-10-11 MED ORDER — ROPINIROLE HCL 1 MG PO TABS
5.0000 mg | ORAL_TABLET | Freq: Every day | ORAL | Status: DC
  Administered 2019-10-11: 5 mg via ORAL
  Filled 2019-10-11 (×3): qty 5

## 2019-10-11 MED ORDER — FENTANYL CITRATE (PF) 250 MCG/5ML IJ SOLN
INTRAMUSCULAR | Status: AC
  Filled 2019-10-11: qty 5

## 2019-10-11 MED ORDER — DILTIAZEM HCL ER COATED BEADS 120 MG PO CP24
120.0000 mg | ORAL_CAPSULE | Freq: Every day | ORAL | Status: DC
  Administered 2019-10-12: 120 mg via ORAL
  Filled 2019-10-11: qty 1

## 2019-10-11 MED ORDER — CEFAZOLIN SODIUM-DEXTROSE 2-4 GM/100ML-% IV SOLN
INTRAVENOUS | Status: AC
  Filled 2019-10-11: qty 100

## 2019-10-11 MED ORDER — DIPHENHYDRAMINE HCL 50 MG/ML IJ SOLN
6.2500 mg | Freq: Once | INTRAMUSCULAR | Status: AC
  Administered 2019-10-11: 6.5 mg via INTRAVENOUS

## 2019-10-11 MED ORDER — ONDANSETRON HCL 4 MG/2ML IJ SOLN
INTRAMUSCULAR | Status: DC | PRN
  Administered 2019-10-11: 4 mg via INTRAVENOUS

## 2019-10-11 MED ORDER — ONDANSETRON HCL 4 MG PO TABS: 4.0000 mg | ORAL_TABLET | Freq: Four times a day (QID) | ORAL | Status: DC | PRN

## 2019-10-11 MED ORDER — METHOCARBAMOL 1000 MG/10ML IJ SOLN
500.0000 mg | Freq: Four times a day (QID) | INTRAVENOUS | Status: DC | PRN
  Filled 2019-10-11: qty 5

## 2019-10-11 MED ORDER — MORPHINE SULFATE (PF) 2 MG/ML IV SOLN: 0.5000 mg | INTRAVENOUS | Status: DC | PRN

## 2019-10-11 MED ORDER — ONDANSETRON HCL 4 MG/2ML IJ SOLN: 4.0000 mg | Freq: Four times a day (QID) | INTRAMUSCULAR | Status: DC | PRN

## 2019-10-11 MED ORDER — CEFAZOLIN SODIUM-DEXTROSE 2-4 GM/100ML-% IV SOLN
2.0000 g | Freq: Once | INTRAVENOUS | Status: AC
  Administered 2019-10-11: 2 g via INTRAVENOUS
  Filled 2019-10-11: qty 100

## 2019-10-11 MED ORDER — LACTATED RINGERS IV SOLN: INTRAVENOUS | Status: DC

## 2019-10-11 MED ORDER — ONDANSETRON HCL 4 MG/2ML IJ SOLN: 4.0000 mg | Freq: Once | INTRAMUSCULAR | Status: DC | PRN

## 2019-10-11 MED ORDER — FENTANYL CITRATE (PF) 250 MCG/5ML IJ SOLN
INTRAMUSCULAR | Status: DC | PRN
  Administered 2019-10-11 (×3): 50 ug via INTRAVENOUS

## 2019-10-11 MED ORDER — PROPOFOL 10 MG/ML IV BOLUS
INTRAVENOUS | Status: DC | PRN
  Administered 2019-10-11: 90 mg via INTRAVENOUS

## 2019-10-11 MED ORDER — ACETAMINOPHEN 500 MG PO TABS
500.0000 mg | ORAL_TABLET | Freq: Four times a day (QID) | ORAL | Status: DC
  Administered 2019-10-11 – 2019-10-12 (×2): 500 mg via ORAL
  Filled 2019-10-11 (×2): qty 1

## 2019-10-11 MED ORDER — DIPHENHYDRAMINE HCL 50 MG/ML IJ SOLN
INTRAMUSCULAR | Status: AC
  Filled 2019-10-11: qty 1

## 2019-10-11 MED ORDER — HYDROCODONE-ACETAMINOPHEN 5-325 MG PO TABS
1.0000 | ORAL_TABLET | ORAL | Status: DC | PRN
  Administered 2019-10-11 – 2019-10-12 (×2): 1 via ORAL
  Filled 2019-10-11: qty 1
  Filled 2019-10-11: qty 2

## 2019-10-11 MED ORDER — ONDANSETRON HCL 4 MG/2ML IJ SOLN
INTRAMUSCULAR | Status: AC
  Filled 2019-10-11: qty 2

## 2019-10-11 MED ORDER — 0.9 % SODIUM CHLORIDE (POUR BTL) OPTIME
TOPICAL | Status: DC | PRN
  Administered 2019-10-11: 14:00:00 1000 mL

## 2019-10-11 MED ORDER — OXYCODONE HCL 5 MG PO TABS: 5.0000 mg | ORAL_TABLET | Freq: Once | ORAL | Status: DC | PRN

## 2019-10-11 MED ORDER — LIDOCAINE HCL (CARDIAC) PF 100 MG/5ML IV SOSY
PREFILLED_SYRINGE | INTRAVENOUS | Status: DC | PRN
  Administered 2019-10-11: 60 mg via INTRAVENOUS

## 2019-10-11 MED ORDER — DEXTROSE-NACL 5-0.45 % IV SOLN: INTRAVENOUS | Status: DC

## 2019-10-11 MED ORDER — ACETAMINOPHEN 325 MG PO TABS: 325.0000 mg | ORAL_TABLET | Freq: Four times a day (QID) | ORAL | Status: DC | PRN

## 2019-10-11 MED ORDER — METHOCARBAMOL 500 MG PO TABS
500.0000 mg | ORAL_TABLET | Freq: Four times a day (QID) | ORAL | Status: DC | PRN
  Administered 2019-10-11 – 2019-10-12 (×2): 500 mg via ORAL
  Filled 2019-10-11 (×2): qty 1

## 2019-10-11 MED ORDER — FENTANYL CITRATE (PF) 100 MCG/2ML IJ SOLN
25.0000 ug | INTRAMUSCULAR | Status: DC | PRN
  Administered 2019-10-11 (×2): 25 ug via INTRAVENOUS

## 2019-10-11 MED ORDER — EPHEDRINE SULFATE-NACL 50-0.9 MG/10ML-% IV SOSY
PREFILLED_SYRINGE | INTRAVENOUS | Status: DC | PRN
  Administered 2019-10-11 (×2): 5 mg via INTRAVENOUS

## 2019-10-11 MED ORDER — LEVOTHYROXINE SODIUM 25 MCG PO TABS
25.0000 ug | ORAL_TABLET | Freq: Every day | ORAL | Status: DC
  Administered 2019-10-12: 25 ug via ORAL
  Filled 2019-10-11: qty 1

## 2019-10-11 MED ORDER — DOCUSATE SODIUM 100 MG PO CAPS
100.0000 mg | ORAL_CAPSULE | Freq: Two times a day (BID) | ORAL | Status: DC
  Administered 2019-10-11 – 2019-10-12 (×2): 100 mg via ORAL
  Filled 2019-10-11 (×2): qty 1

## 2019-10-11 MED ORDER — SODIUM CHLORIDE 0.9 % IR SOLN
Status: DC | PRN
  Administered 2019-10-11: 3000 mL

## 2019-10-11 SURGICAL SUPPLY — 57 items
BAG DECANTER FOR FLEXI CONT (MISCELLANEOUS) IMPLANT
BANDAGE ESMARK 6X9 LF (GAUZE/BANDAGES/DRESSINGS) IMPLANT
BNDG COHESIVE 4X5 TAN STRL (GAUZE/BANDAGES/DRESSINGS) ×2 IMPLANT
BNDG ELASTIC 4X5.8 VLCR STR LF (GAUZE/BANDAGES/DRESSINGS) ×2 IMPLANT
BNDG ELASTIC 6X5.8 VLCR STR LF (GAUZE/BANDAGES/DRESSINGS) ×2 IMPLANT
BNDG ESMARK 6X9 LF (GAUZE/BANDAGES/DRESSINGS)
BNDG GAUZE ELAST 4 BULKY (GAUZE/BANDAGES/DRESSINGS) ×2 IMPLANT
COVER WAND RF STERILE (DRAPES) ×2 IMPLANT
CUFF TOURN SGL QUICK 18X4 (TOURNIQUET CUFF) ×2 IMPLANT
CUFF TOURN SGL QUICK 24 (TOURNIQUET CUFF)
CUFF TOURN SGL QUICK 34 (TOURNIQUET CUFF)
CUFF TOURN SGL QUICK 42 (TOURNIQUET CUFF) IMPLANT
CUFF TRNQT CYL 24X4X16.5-23 (TOURNIQUET CUFF) IMPLANT
CUFF TRNQT CYL 34X4.125X (TOURNIQUET CUFF) IMPLANT
DRAPE U-SHAPE 47X51 STRL (DRAPES) ×2 IMPLANT
DRSG ADAPTIC 3X8 NADH LF (GAUZE/BANDAGES/DRESSINGS) ×2 IMPLANT
DRSG EMULSION OIL 3X3 NADH (GAUZE/BANDAGES/DRESSINGS) ×2 IMPLANT
DRSG PAD ABDOMINAL 8X10 ST (GAUZE/BANDAGES/DRESSINGS) ×2 IMPLANT
DURAPREP 26ML APPLICATOR (WOUND CARE) ×2 IMPLANT
ELECT CAUTERY BLADE 6.4 (BLADE) IMPLANT
ELECT REM PT RETURN 9FT ADLT (ELECTROSURGICAL)
ELECTRODE REM PT RTRN 9FT ADLT (ELECTROSURGICAL) IMPLANT
GAUZE SPONGE 4X4 12PLY STRL (GAUZE/BANDAGES/DRESSINGS) ×2 IMPLANT
GAUZE SPONGE 4X4 16PLY XRAY LF (GAUZE/BANDAGES/DRESSINGS) ×1 IMPLANT
GAUZE XEROFORM 1X8 LF (GAUZE/BANDAGES/DRESSINGS) ×2 IMPLANT
GLOVE BIOGEL PI IND STRL 8 (GLOVE) ×2 IMPLANT
GLOVE BIOGEL PI INDICATOR 8 (GLOVE) ×2
GLOVE ECLIPSE 7.5 STRL STRAW (GLOVE) ×4 IMPLANT
GOWN STRL REUS W/ TWL LRG LVL3 (GOWN DISPOSABLE) ×2 IMPLANT
GOWN STRL REUS W/ TWL XL LVL3 (GOWN DISPOSABLE) ×2 IMPLANT
GOWN STRL REUS W/TWL LRG LVL3 (GOWN DISPOSABLE) ×2
GOWN STRL REUS W/TWL XL LVL3 (GOWN DISPOSABLE) ×2
HANDPIECE INTERPULSE COAX TIP (DISPOSABLE)
KIT BASIN OR (CUSTOM PROCEDURE TRAY) ×2 IMPLANT
KIT TURNOVER KIT B (KITS) ×2 IMPLANT
MANIFOLD NEPTUNE II (INSTRUMENTS) ×2 IMPLANT
NS IRRIG 1000ML POUR BTL (IV SOLUTION) ×2 IMPLANT
PACK ORTHO EXTREMITY (CUSTOM PROCEDURE TRAY) ×2 IMPLANT
PAD ARMBOARD 7.5X6 YLW CONV (MISCELLANEOUS) ×4 IMPLANT
PAD CAST 4YDX4 CTTN HI CHSV (CAST SUPPLIES) ×1 IMPLANT
PADDING CAST COTTON 4X4 STRL (CAST SUPPLIES) ×1
SET HNDPC FAN SPRY TIP SCT (DISPOSABLE) IMPLANT
SPONGE LAP 18X18 RF (DISPOSABLE) ×2 IMPLANT
STAPLER VISISTAT 35W (STAPLE) ×1 IMPLANT
STOCKINETTE IMPERVIOUS 9X36 MD (GAUZE/BANDAGES/DRESSINGS) ×2 IMPLANT
SUT VIC AB 0 CT1 27 (SUTURE) ×1
SUT VIC AB 0 CT1 27XBRD ANBCTR (SUTURE) IMPLANT
SUT VIC AB 2-0 CT1 27 (SUTURE) ×1
SUT VIC AB 2-0 CT1 TAPERPNT 27 (SUTURE) IMPLANT
SWAB CULTURE ESWAB REG 1ML (MISCELLANEOUS) IMPLANT
TAPE CLOTH SURG 6X10 WHT LF (GAUZE/BANDAGES/DRESSINGS) ×1 IMPLANT
TOWEL GREEN STERILE (TOWEL DISPOSABLE) ×2 IMPLANT
TOWEL GREEN STERILE FF (TOWEL DISPOSABLE) ×2 IMPLANT
TUBE CONNECTING 12X1/4 (SUCTIONS) ×2 IMPLANT
UNDERPAD 30X30 (UNDERPADS AND DIAPERS) ×2 IMPLANT
WATER STERILE IRR 1000ML POUR (IV SOLUTION) ×2 IMPLANT
YANKAUER SUCT BULB TIP NO VENT (SUCTIONS) ×2 IMPLANT

## 2019-10-11 NOTE — Anesthesia Procedure Notes (Signed)
Procedure Name: LMA Insertion Date/Time: 10/11/2019 3:08 PM Performed by: Raenette Rover, CRNA Pre-anesthesia Checklist: Patient identified, Emergency Drugs available, Suction available and Patient being monitored Patient Re-evaluated:Patient Re-evaluated prior to induction Oxygen Delivery Method: Circle system utilized Preoxygenation: Pre-oxygenation with 100% oxygen Induction Type: IV induction LMA: LMA inserted LMA Size: 4.0 Number of attempts: 1 Placement Confirmation: positive ETCO2 and breath sounds checked- equal and bilateral Tube secured with: Tape Dental Injury: Teeth and Oropharynx as per pre-operative assessment

## 2019-10-11 NOTE — Anesthesia Preprocedure Evaluation (Addendum)
Anesthesia Evaluation  Patient identified by MRN, date of birth, ID band Patient awake    Reviewed: Allergy & Precautions, NPO status , Patient's Chart, lab work & pertinent test results  History of Anesthesia Complications Negative for: history of anesthetic complications  Airway Mallampati: II  TM Distance: >3 FB Neck ROM: Full    Dental  (+) Teeth Intact   Pulmonary sleep apnea , former smoker,    Pulmonary exam normal        Cardiovascular Normal cardiovascular exam+ dysrhythmias (on Eliquis) Atrial Fibrillation      Neuro/Psych negative neurological ROS  negative psych ROS   GI/Hepatic negative GI ROS, Neg liver ROS,   Endo/Other  Hypothyroidism   Renal/GU negative Renal ROS  negative genitourinary   Musculoskeletal negative musculoskeletal ROS (+)   Abdominal   Peds  Hematology  (+) anemia ,   Anesthesia Other Findings   Reproductive/Obstetrics                           Anesthesia Physical Anesthesia Plan  ASA: III  Anesthesia Plan: General   Post-op Pain Management:    Induction: Intravenous  PONV Risk Score and Plan: 2 and Ondansetron, Dexamethasone, Midazolam and Treatment may vary due to age or medical condition  Airway Management Planned: LMA  Additional Equipment: None  Intra-op Plan:   Post-operative Plan: Extubation in OR  Informed Consent: I have reviewed the patients History and Physical, chart, labs and discussed the procedure including the risks, benefits and alternatives for the proposed anesthesia with the patient or authorized representative who has indicated his/her understanding and acceptance.     Dental advisory given  Plan Discussed with:   Anesthesia Plan Comments:        Anesthesia Quick Evaluation

## 2019-10-11 NOTE — H&P (Signed)
A pre op hand p   Chief Complaint: Right leg pain  HPI: Travis Bradford is a 86 y.o. male who presents for evaluation of right leg pain with known hematoma by MRI. It has been present for 10 days and has been worsening. He has failed conservative measures. Pain is rated as severe.  Past Medical History:  Diagnosis Date  . Abnormal stress test    a. 07/2012: abnormal stress echo showing hypokinesis of the mid-distal inferior wall suggestive of ischemia; + frequent PVCs, no ST changes or CP. Dr. [name] had a long discussion with the patient about further w/u versus medical therapy and the patient elected medical therapy..  . Arthritis   . Atrial fibrillation ([place])    a. Dx 01/2016.  [name] BPH (benign prostatic hyperplasia)   . History of blood transfusion   . Hyperlipidemia   . Hypothyroid   . Lymphoma ([place])    15 years ago  . Pneumonia   . Prostate cancer ([place])   . PVC's (premature ventricular contractions)    a. 07/2012 - seen on stress echo.  [name] RBBB    a. h/o intermittent rbbb.  . Restless legs syndrome   . Sinus bradycardia   . Sleep apnea    does not use a cpap   Past Surgical History:  Procedure Laterality Date  . APPENDECTOMY    . Athroscopic knee surgery    . COLONOSCOPY    . dental implants    . GREEN LIGHT LASER TURP (TRANSURETHRAL RESECTION OF PROSTATE  08/29/2012   Procedure: GREEN LIGHT LASER TURP (TRANSURETHRAL RESECTION OF PROSTATE;  Surgeon: [name], MD;  Location: [facility];  Service: Urology;  Laterality: N/A;  . MOHS SURGERY     x 6   Social History   Socioeconomic History  . Marital status: Widowed    Spouse name: Not on file  . Number of children: 2  . Years of education: Not on file  . Highest education level: Not on file  Occupational History  . Not on file  Tobacco Use  . Smoking status: Former Research scientist (life sciences)  . Smokeless tobacco: Never Used  Substance and Sexual Activity  . Alcohol use: Not Currently    Comment: Rarely  . Drug use: Never  .  Sexual activity: Not on file  Other Topics Concern  . Not on file  Social History Narrative  . Not on file   Social Determinants of Health   Financial Resource Strain:   . Difficulty of Paying Living Expenses:   Food Insecurity:   . Worried About Charity fundraiser in the Last Year:   . Arboriculturist in the Last Year:   Transportation Needs:   . Film/video editor (Medical):   [name] Lack of Transportation (Non-Medical):   Physical Activity:   . Days of Exercise per Week:   . Minutes of Exercise per Session:   Stress:   . Feeling of Stress :   Social Connections:   . Frequency of Communication with Friends and Family:   . Frequency of Social Gatherings with Friends and Family:   . Attends Religious Services:   . Active Member of Clubs or Organizations:   . Attends Archivist Meetings:   [name] Marital Status:    Family History  Problem Relation Age of Onset  . CAD Mother   . Stroke Brother         X 2   No Known Allergies Prior to Admission  medications   Medication Sig Start Date End Date Taking? Authorizing Provider  apixaban (ELIQUIS) 5 MG TABS tablet Take 1 tablet (5 mg total) by mouth 2 (two) times daily. 02/20/16  Yes Lelon Perla, MD  diltiazem (CARDIZEM CD) 120 MG 24 hr capsule Take 1 capsule (120 mg total) by mouth daily. 02/21/19  Yes Lelon Perla, MD  ergocalciferol (VITAMIN D2) 50000 UNITS capsule Take 50,000 Units by mouth once a week.   Yes [provider]  fish oil-omega-3 fatty acids 1000 MG capsule Take 1 g by mouth daily.    Yes [provider]  GLUCOSAMINE HCL-MSM PO Take 1,500 mg by mouth daily.   Yes [provider]  HYDROcodone-acetaminophen (NORCO/VICODIN) 5-325 MG tablet Take 1 tablet by mouth every 6 (six) hours as needed. 10/03/19  Yes [provider]  levothyroxine (SYNTHROID) 25 MCG tablet Take 25 mcg by mouth daily.    Yes [provider]  ropinirole (REQUIP) 5 MG tablet Take 5 mg by  mouth at bedtime. 2100   Yes [provider]     Positive ROS: None  All other systems have been reviewed and were otherwise negative with the exception of those mentioned in the HPI and as above.  Physical Exam: Vitals:   10/11/19 1226  BP: 130/73  Pulse: 60  Resp: 18  Temp: 98.4 F (36.9 C)  SpO2: 98%    General: Alert, no acute distress Cardiovascular: No pedal edema Respiratory: No cyanosis, no use of accessory musculature GI: No organomegaly, abdomen is soft and non-tender Skin: No lesions in the area of chief complaint Neurologic: Sensation intact distally Psychiatric: Patient is competent for consent with normal mood and affect Lymphatic: No axillary or cervical lymphadenopathy  MUSCULOSKELETAL: Right leg: Painful range of motion.  Limited range of motion.  Tender to palpation.  Pain with rotation.  MRI and ultrasound show 7 x 15 x 4 cm hematoma in the mid thigh.  Assessment/Plan: RIGHT LEG HEMATOMA Plan for Procedure(s): RIGHT LEG EVACUATION OF DEEP HEMATOMA  The risks benefits and alternatives were discussed with the patient including but not limited to the risks of nonoperative treatment, versus surgical intervention including infection, bleeding, nerve injury, malunion, nonunion, hardware prominence, hardware failure, need for hardware removal, blood clots, cardiopulmonary complications, morbidity, mortality, among others, and they were willing to proceed.  Predicted outcome is good, although there will be at least a six to nine month expected recovery.  Alta Corning, MD 10/11/2019 2:52 PM

## 2019-10-11 NOTE — Discharge Instructions (Signed)
Keep dressing clean and dry.  Reinforce the dressing if needed. Weight-bear as tolerated on right lower extremity with walker.  Resume normal Eliquis dose on Friday.

## 2019-10-11 NOTE — Brief Op Note (Signed)
10/11/2019  3:31 PM  PATIENT:  Denman George  86 y.o. male  PRE-OPERATIVE DIAGNOSIS:  RIGHT LEG HEMATOMA  POST-OPERATIVE DIAGNOSIS:  RIGHT LEG HEMATOMA  PROCEDURE:  Procedure(s): RIGHT LEG EVACUATION OF DEEP HEMATOMA (Right)  SURGEON:  Surgeon(s) and Role:    [name], MD - Primary  PHYSICIAN ASSISTANT:   ASSISTANTS: jim bethune   ANESTHESIA:   general  EBL:  100 mL   BLOOD ADMINISTERED:none  DRAINS: Penrose drain in the r leg   LOCAL MEDICATIONS USED:  NONE  SPECIMEN:  No Specimen  DISPOSITION OF SPECIMEN:  N/A  COUNTS:  YES  TOURNIQUET:  * No tourniquets in log *  DICTATION: .Other Dictation: Dictation Number [id number]  PLAN OF CARE: Admit for overnight observation  PATIENT DISPOSITION:  PACU - hemodynamically stable.   Delay start of Pharmacological VTE agent (>24hrs) due to surgical blood loss or risk of bleeding: no

## 2019-10-11 NOTE — Transfer of Care (Signed)
Immediate Anesthesia Transfer of Care Note  Patient: Travis Bradford  Procedure(s) Performed: RIGHT LEG EVACUATION OF DEEP HEMATOMA (Right )  Patient Location: PACU  Anesthesia Type:General  Level of Consciousness: awake  Airway & Oxygen Therapy: Patient Spontanous Breathing and Patient connected to face mask oxygen  Post-op Assessment: Report given to RN and Post -op Vital signs reviewed and stable  Post vital signs: Reviewed and stable  Last Vitals:  Vitals Value Taken Time  BP 161/71 10/11/19 1541  Temp    Pulse 96 10/11/19 1544  Resp 24 10/11/19 1544  SpO2 93 % 10/11/19 1544  Vitals shown include unvalidated device data.  Last Pain:  Vitals:   10/11/19 1238  TempSrc:   PainSc: 2          Complications: No apparent anesthesia complications

## 2019-10-11 NOTE — Plan of Care (Signed)

## 2019-10-12 DIAGNOSIS — S7011XA Contusion of right thigh, initial encounter: Secondary | ICD-10-CM | POA: Diagnosis not present

## 2019-10-12 NOTE — Discharge Summary (Signed)
Patient ID: Travis Bradford MRN: [id number] DOB/AGE: 1926/05/03 86 y.o.  Admit date: 10/11/2019 Discharge date: 10/12/2019  Admission Diagnoses:  Principal Problem:   Traumatic hematoma of right thigh   Discharge Diagnoses:  Same  Past Medical History:  Diagnosis Date  . Abnormal stress test    a. 07/2012: abnormal stress echo showing hypokinesis of the mid-distal inferior wall suggestive of ischemia; + frequent PVCs, no ST changes or CP. Dr. [name] had a long discussion with the patient about further w/u versus medical therapy and the patient elected medical therapy..  . Arthritis   . Atrial fibrillation ([place])    a. Dx 01/2016.  [name] BPH (benign prostatic hyperplasia)   . History of blood transfusion   . Hyperlipidemia   . Hypothyroid   . Lymphoma ([place])    15 years ago  . Pneumonia   . Prostate cancer ([place])   . PVC's (premature ventricular contractions)    a. 07/2012 - seen on stress echo.  [name] RBBB    a. h/o intermittent rbbb.  . Restless legs syndrome   . Sinus bradycardia   . Sleep apnea    does not use a cpap    Surgeries: Procedure(s): RIGHT LEG EVACUATION OF DEEP HEMATOMA on 10/11/2019   Consultants:   Discharged Condition: Improved  Hospital Course: Travis Bradford is an 86 y.o. male who was admitted 10/11/2019 for operative treatment ofTraumatic hematoma of right thigh. Patient has severe unremitting pain that affects sleep, daily activities, and work/hobbies. After pre-op clearance the patient was taken to the operating room on 10/11/2019 and underwent  Procedure(s): RIGHT LEG EVACUATION OF DEEP HEMATOMA.    Patient was given perioperative antibiotics:  Anti-infectives (From admission, onward)   Start     Dose/Rate Route Frequency Ordered Stop   10/11/19 1800  ceFAZolin (ANCEF) IVPB 2g/100 mL premix     2 g 200 mL/hr over 30 Minutes Intravenous  Once 10/11/19 1655 10/11/19 1759   10/11/19 1452  ceFAZolin (ANCEF) 2-4 GM/100ML-% IVPB    Note to Pharmacy: Tamsen Snider   : cabinet override      10/11/19 1452 10/12/19 0259       Patient was given sequential compression devices, early ambulation, and chemoprophylaxis to prevent DVT.His Eliquis was held preoperatively and will be held until 2 days postop.  Patient benefited maximally from hospital stay and there were no complications.    Recent vital signs:  Patient Vitals for the past 24 hrs:  BP Temp Temp src Pulse Resp SpO2 Height Weight  10/12/19 0832 103/66 98.5 F (36.9 C) Oral 64 16 98 % -- --  10/12/19 0514 126/64 97.8 F (36.6 C) Oral 68 16 99 % -- --  10/11/19 2358 110/60 98 F (36.7 C) Oral 60 16 94 % -- --  10/11/19 1959 120/70 97.9 F (36.6 C) Oral (!) 46 16 97 % -- --  10/11/19 1653 (!) 157/74 (!) 97.5 F (36.4 C) Oral (!) 59 18 98 % -- --  10/11/19 1633 (!) 133/100 (!) 96.8 F (36 C) -- (!) 108 (!) 28 98 % -- --  10/11/19 1618 (!) 127/95 -- -- 95 (!) 21 97 % -- --  10/11/19 1603 (!) 146/100 -- -- 87 (!) 23 96 % -- --  10/11/19 1541 (!) 161/71 97.7 F (36.5 C) -- 90 14 97 % -- --  10/11/19 1226 130/73 98.4 F (36.9 C) Oral 60 18 98 % 5\' 9"  (1.753 m) 90.7 kg     Recent laboratory  studies:  Recent Labs    10/11/19 1220  WBC 10.5  HGB 12.1*  HCT 37.2*  PLT 359  NA 134*  K 4.2  CL 100  CO2 23  BUN 22  CREATININE 0.96  GLUCOSE 134*  CALCIUM 9.2     Discharge Medications:   Allergies as of 10/12/2019   No Known Allergies     Medication List    STOP taking these medications   apixaban 5 MG Tabs tablet Commonly known as: Eliquis     TAKE these medications   diltiazem 120 MG 24 hr capsule Commonly known as: Cardizem CD Take 1 capsule (120 mg total) by mouth daily.   ergocalciferol 1.25 MG (50000 UT) capsule Commonly known as: VITAMIN D2 Take 50,000 Units by mouth once a week.   fish oil-omega-3 fatty acids 1000 MG capsule Take 1 g by mouth daily.   GLUCOSAMINE HCL-MSM PO Take 1,500 mg by mouth daily.   HYDROcodone-acetaminophen 5-325 MG  tablet Commonly known as: NORCO/VICODIN Take 1 tablet by mouth every 6 (six) hours as needed.   levothyroxine 25 MCG tablet Commonly known as: SYNTHROID Take 25 mcg by mouth daily.   ropinirole 5 MG tablet Commonly known as: REQUIP Take 5 mg by mouth at bedtime. 2100            Discharge Care Instructions  (From admission, onward)         Start     Ordered   10/12/19 0000  Weight bearing as tolerated    Question Answer Comment  Laterality right   Extremity Lower      10/12/19 0916          Diagnostic Studies: CT FEMUR RIGHT WO CONTRAST  Result Date: 10/06/2019 CLINICAL DATA:  Fall 5 days ago. Lymphoma. Right thigh hematoma. EXAM: CT OF THE LOWER RIGHT EXTREMITY WITHOUT CONTRAST TECHNIQUE: Multidetector CT imaging of the right lower extremity was performed according to the standard protocol. COMPARISON:  CT scan 08/20/2016 FINDINGS: Bones/Joint/Cartilage Synovial herniation pit noted along the anterior right femoral head. No fracture. Trace chronic heterotopic ossification along the medial femoral intermuscular septum, unchanged. Small knee effusion. Degenerative articular space narrowing in the medial compartment of the knee. Mild spurring and degenerative subcortical cyst formation along the anterior superior acetabulum. No hip joint effusion observed. Ligaments Suboptimally assessed by CT. Muscles and Tendons High-density lesion favoring hematoma given the clinical circumstances in the lateral portion of the vastus intermedius muscle. This has a density only minimally greater than the muscle itself, reducing accuracy of size estimates, but thought to be about 15.6 by 7.5 by 3.4 cm (volume = 210 cm^3). Chronic 0.9 cm focus of heterotopic ossification in the posterior margin of the rectus femoris muscle on image 97/4, unchanged. Soft tissues A right groin hernia contains a loop of small bowel without findings of strangulation or obstruction. Atherosclerotic calcification of the  external iliac, common femoral, and superficial femoral artery and popliteal artery. Mild subcutaneous edema in the thigh and more strikingly along the anterolateral knee. IMPRESSION: 1. High-density lesion in the lateral portion of the vastus intermedius muscle, probably a hematoma given the clinical circumstances. 2. Right knee effusion. 3. Atherosclerosis. 4. Right groin hernia contains a loop of small bowel without findings of strangulation or obstruction. 5. Degenerative articular space narrowing in the medial compartment of the knee. 6. Mild subcutaneous edema in the thigh and more strikingly along the anterolateral knee. Electronically Signed   By: Van Clines M.D.   On: 10/06/2019  08:50    Disposition: Discharge disposition: 01-Home or Self Care       Discharge Instructions    Call MD / Call 911   Complete by: As directed    If you experience chest pain or shortness of breath, CALL 911 and be transported to the hospital emergency room.  If you develope a fever above 101 F, pus (white drainage) or increased drainage or redness at the wound, or calf pain, call your surgeon's office.   Diet general   Complete by: As directed    Increase activity slowly as tolerated   Complete by: As directed    Weight bearing as tolerated   Complete by: As directed    Laterality: right   Extremity: Lower      Follow-up Information    [name], MD. Schedule an appointment as soon as possible for a visit on 10/16/2019.   Specialty: Orthopedic Surgery Contact information: [place] [address] [phone]            Signed: Erlene Senters 10/12/2019, 9:17 AM

## 2019-10-12 NOTE — Evaluation (Signed)
Physical Therapy Evaluation Patient Details Name: Travis Bradford MRN: [id number] DOB: 12-27-1925 Today's Date: 10/12/2019   History of Present Illness  86 yo admitted with right thigh hematoma s/p evacuation. PMhx: Afib, BPH, HLD, prostate CA, RBBB  Clinical Impression  Pt supine on arrival reporting pain in thigh despite premedication but willing to move. Pt reports several falls and that he lives with son but that son is not around 24hr/day. Pt states he uses rollator at home and performs limited mobility in home. Pt with decreased RLE strength and ROM, decreased function, transfers and gait who will benefit from acute therapy to maximize mobility, safety and independence to decrease burden of care. To return home family will need to assist with all transfers and be able to provide sufficient assist (currently mod for bed mobility).      Follow Up Recommendations SNF;Supervision for mobility/OOB;Home health PT(SNF if family unable to provide 24hr assist and pt slow to progress)    Equipment Recommendations  Rolling walker with 5" wheels;3in1 (PT)    Recommendations for Other Services OT consult     Precautions / Restrictions Precautions Precautions: Fall Precaution Comments: Rt thigh wound with limited tolerance for knee flexion      Mobility  Bed Mobility Overal bed mobility: Needs Assistance Bed Mobility: Supine to Sit     Supine to sit: Mod assist;HOB elevated     General bed mobility comments: physical assist and increased time to move RLE to EOB, elevate trunk and sequence transfer  Transfers Overall transfer level: Needs assistance   Transfers: Sit to/from Stand Sit to Stand: Mod assist;From elevated surface         General transfer comment: cues for hand placement and sequence with pt maintaining Rt knee in extension with transfers  Ambulation/Gait Ambulation/Gait assistance: Min guard Gait Distance (Feet): 20 Feet Assistive device: Rolling walker (2  wheeled) Gait Pattern/deviations: Step-to pattern;Decreased stride length;Trunk flexed;Antalgic;Decreased stance time - right   Gait velocity interpretation: <1.8 ft/sec, indicate of risk for recurrent falls General Gait Details: cues for sequence and proximity to RW with pt maintaining self too posterior to RW  Stairs            Wheelchair Mobility    Modified Rankin (Stroke Patients Only)       Balance Overall balance assessment: History of Falls;Needs assistance Sitting-balance support: Feet supported;Bilateral upper extremity supported Sitting balance-Leahy Scale: Fair     Standing balance support: Bilateral upper extremity supported Standing balance-Leahy Scale: Poor Standing balance comment: RW use for standing and gait                             Pertinent Vitals/Pain Pain Assessment: 0-10 Pain Score: 6  Pain Location: right thigh particularly with knee flexion Pain Descriptors / Indicators: Guarding;Aching;Tender Pain Intervention(s): Limited activity within patient's tolerance;Monitored during session;Premedicated before session;Repositioned    Home Living Family/patient expects to be discharged to:: Private residence Living Arrangements: Children Available Help at Discharge: Family;Available PRN/intermittently Type of Home: House Home Access: Stairs to enter   Entrance Stairs-Number of Steps: 3 Home Layout: Two level;Able to live on main level with bedroom/bathroom Home Equipment: Walker - 2 wheels      Prior Function Level of Independence: Needs assistance   Gait / Transfers Assistance Needed: walks with walker  ADL's / Homemaking Assistance Needed: son does the cleaning and cooking, assists with dressing and bathing at times  Comments: pt reports a few falls in  the last year     Hand Dominance        Extremity/Trunk Assessment   Upper Extremity Assessment Upper Extremity Assessment: Generalized weakness    Lower Extremity  Assessment Lower Extremity Assessment: RLE deficits/detail RLE Deficits / Details: pt unable to tolerate knee flexion supine beyond grossly 20 degrees RLE: Unable to fully assess due to pain    Cervical / Trunk Assessment Cervical / Trunk Assessment: Kyphotic  Communication   Communication: HOH  Cognition Arousal/Alertness: Awake/alert Behavior During Therapy: Flat affect Overall Cognitive Status: Difficult to assess                                        General Comments      Exercises     Assessment/Plan    PT Assessment Patient needs continued PT services  PT Problem List Decreased strength;Decreased mobility;Decreased safety awareness;Decreased activity tolerance;Decreased balance;Decreased knowledge of use of DME;Decreased range of motion;Pain       PT Treatment Interventions Gait training;Balance training;Stair training;Functional mobility training;Therapeutic activities;Patient/family education;DME instruction;Therapeutic exercise    PT Goals (Current goals can be found in the Care Plan section)  Acute Rehab PT Goals Patient Stated Goal: return home and sleep PT Goal Formulation: With patient Time For Goal Achievement: 10/26/19 Potential to Achieve Goals: Fair    Frequency Min 3X/week   Barriers to discharge Decreased caregiver support      Co-evaluation               AM-PAC PT "6 Clicks" Mobility  Outcome Measure Help needed turning from your back to your side while in a flat bed without using bedrails?: A Lot Help needed moving from lying on your back to sitting on the side of a flat bed without using bedrails?: A Lot Help needed moving to and from a bed to a chair (including a wheelchair)?: A Lot Help needed standing up from a chair using your arms (e.g., wheelchair or bedside chair)?: A Lot Help needed to walk in hospital room?: A Little Help needed climbing 3-5 steps with a railing? : A Lot 6 Click Score: 13    End of Session  Equipment Utilized During Treatment: Gait belt Activity Tolerance: Patient tolerated treatment well Patient left: in chair;with call bell/phone within reach;with chair alarm set Nurse Communication: Mobility status PT Visit Diagnosis: Other abnormalities of gait and mobility (R26.89);Difficulty in walking, not elsewhere classified (R26.2);History of falling (Z91.81)    Time: 0921-1000 PT Time Calculation (min) (ACUTE ONLY): 39 min   Charges:   PT Evaluation $PT Eval Moderate Complexity: 1 Mod PT Treatments $Gait Training: 8-22 mins $Therapeutic Activity: 8-22 mins        Carmine Carrozza P, PT Acute Rehabilitation Services Pager: [phone] Office: [place] 10/12/2019, 11:57 AM

## 2019-10-12 NOTE — Op Note (Signed)
[name], [name] MEDICAL RECORD [id number] ACCOUNT [id number] DATE OF BIRTH:08/15/1925 FACILITY: [facility] LOCATION: [facility] PHYSICIAN:Zaccary Creech L. Hikari Tripp, MD  OPERATIVE REPORT  DATE OF PROCEDURE:  10/11/2019  PREOPERATIVE DIAGNOSIS:  Deep hematoma, right leg.  POSTOPERATIVE DIAGNOSIS:  Deep hematoma, right leg.  PROCEDURE:  Irrigation and debridement of deep hematoma, right leg.  SURGEON:  [name], MD  ASSISTANT:  Gary Fleet, PA-C, was present for the entire case and assisted by manipulation of the leg and closing to minimize OR time.  BRIEF HISTORY:  The patient is a 86 year old male with a history of having had a spontaneous bleed in his right thigh.  We saw him in the office and we were trying to treat him conservatively.  He was just having worsening pain and inability to walk.   His son was afraid he was going to fall and break something.  We talked about the fact that he could just be dwindling and this could be more age-related than related to his hematoma, but because he had a large hematoma 7 cm x 15 cm x 3.5 cm on CAT scan  we felt that draining it was reasonable.  He was brought to the operating room for this procedure.  DESCRIPTION OF PROCEDURE:  The patient brought to the operating room after adequate anesthesia was obtained with general anesthetic.  The patient was brought to the operating table, right leg was prepped and draped in usual sterile fashion.  Following  this, a 1-inch incision was made measured by a CAT scan to the exact inferior most location of the hematoma.  Dissected down to the tensor fascia and then really went through the vastus lateralis to the vastus intermedius and then got a finger and a  sucker in there where all this hematoma was on CAT scan.  We then milked this area out, irrigated it out thoroughly.  We got several 100 mL of blood out of this area.  The thigh was much softer once we finished.  At this point, the wound was irrigated,   suctioned dry.  We closed the skin with nylon interrupted sutures and left a Penrose drain.  Will leave it in for a few days and then pull it out in the office probably on the weekend or Monday.  If he has any issues he knows to call us.  Estimated blood  loss for procedure was minimal other than the evacuation of the hematoma.  CN/NUANCE  D:10/11/2019 T:10/12/2019 JOB:[id number]

## 2019-10-12 NOTE — Progress Notes (Signed)
Subjective: 1 Day Post-Op Procedure(s) (LRB): RIGHT LEG EVACUATION OF DEEP HEMATOMA (Right) Patient reports pain as moderate. Taking by mouth voiding okay.  Patient is in bed.  He is a little cantankerous this a.m.   Objective: Vital signs in last 24 hours: Temp:  [96.8 F (36 C)-98.5 F (36.9 C)] 98.5 F (36.9 C) (03/18 0832) Pulse Rate:  [46-108] 64 (03/18 0832) Resp:  [14-28] 16 (03/18 0832) BP: (103-161)/(60-100) 103/66 (03/18 0832) SpO2:  [94 %-99 %] 98 % (03/18 0832) Weight:  [90.7 kg] 90.7 kg (03/17 1226)  Intake/Output from previous day: 03/17 0701 - 03/18 0700 In: 1377.9 [I.V.:1377.9] Out: 950 [Urine:850; Blood:100] Intake/Output this shift: No intake/output data recorded.  Recent Labs    10/11/19 1220  HGB 12.1*   Recent Labs    10/11/19 1220  WBC 10.5  RBC 3.62*  HCT 37.2*  PLT 359   Recent Labs    10/11/19 1220  NA 134*  K 4.2  CL 100  CO2 23  BUN 22  CREATININE 0.96  GLUCOSE 134*  CALCIUM 9.2   No results for input(s): LABPT, INR in the last 72 hours. Right lower extremity exam: His surgical dressing is clean and dry.  His thigh compartment is soft.  The thigh compartment is tender.  His calf is soft and nontender.  He has no swelling distally.   Assessment/Plan: 1 Day Post-Op Procedure(s) (LRB): RIGHT LEG EVACUATION OF DEEP HEMATOMA (Right)  Plan: Up with physical therapy this a.m.  Weight-bear as tolerated on right lower extremity. I spoke with the patient's son [name].  He will be okay to take him home today.  He is instructed to resume his Eliquis tomorrow.  He has hydrocodone at home for pain.  He will keep the dressing in place. Follow-up with Dr. Berenice Primas in the office on Monday, March 22.  The patient's son will call for an appointment time   Erlene Senters 10/12/2019, 9:11 AM

## 2019-10-13 NOTE — Anesthesia Postprocedure Evaluation (Signed)
Anesthesia Post Note  Patient: Travis Bradford  Procedure(s) Performed: RIGHT LEG EVACUATION OF DEEP HEMATOMA (Right )     Patient location during evaluation: PACU Anesthesia Type: General Level of consciousness: awake and patient cooperative Pain management: pain level controlled Vital Signs Assessment: post-procedure vital signs reviewed and stable Respiratory status: spontaneous breathing, nonlabored ventilation, respiratory function stable and patient connected to nasal cannula oxygen Cardiovascular status: blood pressure returned to baseline and stable Postop Assessment: no apparent nausea or vomiting Anesthetic complications: no    Last Vitals:  Vitals:   10/12/19 0514 10/12/19 0832  BP: 126/64 103/66  Pulse: 68 64  Resp: 16 16  Temp: 36.6 C 36.9 C  SpO2: 99% 98%    Last Pain:  Vitals:   10/12/19 1003  TempSrc:   PainSc: Asleep                 Hertha Gergen

## 2019-10-16 DIAGNOSIS — N400 Enlarged prostate without lower urinary tract symptoms: Secondary | ICD-10-CM | POA: Diagnosis not present

## 2019-10-16 DIAGNOSIS — E039 Hypothyroidism, unspecified: Secondary | ICD-10-CM | POA: Diagnosis not present

## 2019-10-16 DIAGNOSIS — I4891 Unspecified atrial fibrillation: Secondary | ICD-10-CM | POA: Diagnosis not present

## 2019-10-16 DIAGNOSIS — R296 Repeated falls: Secondary | ICD-10-CM | POA: Diagnosis not present

## 2019-10-16 DIAGNOSIS — R001 Bradycardia, unspecified: Secondary | ICD-10-CM | POA: Diagnosis not present

## 2019-10-16 DIAGNOSIS — R69 Illness, unspecified: Secondary | ICD-10-CM | POA: Diagnosis not present

## 2019-10-16 DIAGNOSIS — I4510 Unspecified right bundle-branch block: Secondary | ICD-10-CM | POA: Diagnosis not present

## 2019-10-16 DIAGNOSIS — E785 Hyperlipidemia, unspecified: Secondary | ICD-10-CM | POA: Diagnosis not present

## 2019-10-16 DIAGNOSIS — S76891D Other injury of other specified muscles, fascia and tendons at thigh level, right thigh, subsequent encounter: Secondary | ICD-10-CM | POA: Diagnosis not present

## 2019-10-16 DIAGNOSIS — G2581 Restless legs syndrome: Secondary | ICD-10-CM | POA: Diagnosis not present

## 2019-10-24 DIAGNOSIS — I4510 Unspecified right bundle-branch block: Secondary | ICD-10-CM | POA: Diagnosis not present

## 2019-10-24 DIAGNOSIS — R296 Repeated falls: Secondary | ICD-10-CM | POA: Diagnosis not present

## 2019-10-24 DIAGNOSIS — G2581 Restless legs syndrome: Secondary | ICD-10-CM | POA: Diagnosis not present

## 2019-10-24 DIAGNOSIS — R69 Illness, unspecified: Secondary | ICD-10-CM | POA: Diagnosis not present

## 2019-10-24 DIAGNOSIS — I4891 Unspecified atrial fibrillation: Secondary | ICD-10-CM | POA: Diagnosis not present

## 2019-10-24 DIAGNOSIS — E785 Hyperlipidemia, unspecified: Secondary | ICD-10-CM | POA: Diagnosis not present

## 2019-10-24 DIAGNOSIS — R001 Bradycardia, unspecified: Secondary | ICD-10-CM | POA: Diagnosis not present

## 2019-10-24 DIAGNOSIS — S76891D Other injury of other specified muscles, fascia and tendons at thigh level, right thigh, subsequent encounter: Secondary | ICD-10-CM | POA: Diagnosis not present

## 2019-10-24 DIAGNOSIS — E039 Hypothyroidism, unspecified: Secondary | ICD-10-CM | POA: Diagnosis not present

## 2019-10-24 DIAGNOSIS — N400 Enlarged prostate without lower urinary tract symptoms: Secondary | ICD-10-CM | POA: Diagnosis not present

## 2019-10-26 DIAGNOSIS — G2581 Restless legs syndrome: Secondary | ICD-10-CM | POA: Diagnosis not present

## 2019-10-26 DIAGNOSIS — E039 Hypothyroidism, unspecified: Secondary | ICD-10-CM | POA: Diagnosis not present

## 2019-10-26 DIAGNOSIS — S76891D Other injury of other specified muscles, fascia and tendons at thigh level, right thigh, subsequent encounter: Secondary | ICD-10-CM | POA: Diagnosis not present

## 2019-10-26 DIAGNOSIS — R69 Illness, unspecified: Secondary | ICD-10-CM | POA: Diagnosis not present

## 2019-10-26 DIAGNOSIS — N400 Enlarged prostate without lower urinary tract symptoms: Secondary | ICD-10-CM | POA: Diagnosis not present

## 2019-10-26 DIAGNOSIS — E785 Hyperlipidemia, unspecified: Secondary | ICD-10-CM | POA: Diagnosis not present

## 2019-10-26 DIAGNOSIS — R296 Repeated falls: Secondary | ICD-10-CM | POA: Diagnosis not present

## 2019-10-26 DIAGNOSIS — I4510 Unspecified right bundle-branch block: Secondary | ICD-10-CM | POA: Diagnosis not present

## 2019-10-26 DIAGNOSIS — I4891 Unspecified atrial fibrillation: Secondary | ICD-10-CM | POA: Diagnosis not present

## 2019-10-26 DIAGNOSIS — R001 Bradycardia, unspecified: Secondary | ICD-10-CM | POA: Diagnosis not present

## 2019-10-31 DIAGNOSIS — I4510 Unspecified right bundle-branch block: Secondary | ICD-10-CM | POA: Diagnosis not present

## 2019-10-31 DIAGNOSIS — N400 Enlarged prostate without lower urinary tract symptoms: Secondary | ICD-10-CM | POA: Diagnosis not present

## 2019-10-31 DIAGNOSIS — E785 Hyperlipidemia, unspecified: Secondary | ICD-10-CM | POA: Diagnosis not present

## 2019-10-31 DIAGNOSIS — R296 Repeated falls: Secondary | ICD-10-CM | POA: Diagnosis not present

## 2019-10-31 DIAGNOSIS — R001 Bradycardia, unspecified: Secondary | ICD-10-CM | POA: Diagnosis not present

## 2019-10-31 DIAGNOSIS — S76891D Other injury of other specified muscles, fascia and tendons at thigh level, right thigh, subsequent encounter: Secondary | ICD-10-CM | POA: Diagnosis not present

## 2019-10-31 DIAGNOSIS — G2581 Restless legs syndrome: Secondary | ICD-10-CM | POA: Diagnosis not present

## 2019-10-31 DIAGNOSIS — I4891 Unspecified atrial fibrillation: Secondary | ICD-10-CM | POA: Diagnosis not present

## 2019-10-31 DIAGNOSIS — R69 Illness, unspecified: Secondary | ICD-10-CM | POA: Diagnosis not present

## 2019-10-31 DIAGNOSIS — E039 Hypothyroidism, unspecified: Secondary | ICD-10-CM | POA: Diagnosis not present

## 2019-11-02 DIAGNOSIS — G2581 Restless legs syndrome: Secondary | ICD-10-CM | POA: Diagnosis not present

## 2019-11-02 DIAGNOSIS — I4510 Unspecified right bundle-branch block: Secondary | ICD-10-CM | POA: Diagnosis not present

## 2019-11-02 DIAGNOSIS — E785 Hyperlipidemia, unspecified: Secondary | ICD-10-CM | POA: Diagnosis not present

## 2019-11-02 DIAGNOSIS — Z008 Encounter for other general examination: Secondary | ICD-10-CM | POA: Diagnosis not present

## 2019-11-02 DIAGNOSIS — R001 Bradycardia, unspecified: Secondary | ICD-10-CM | POA: Diagnosis not present

## 2019-11-02 DIAGNOSIS — C8590 Non-Hodgkin lymphoma, unspecified, unspecified site: Secondary | ICD-10-CM | POA: Diagnosis not present

## 2019-11-02 DIAGNOSIS — S76891D Other injury of other specified muscles, fascia and tendons at thigh level, right thigh, subsequent encounter: Secondary | ICD-10-CM | POA: Diagnosis not present

## 2019-11-02 DIAGNOSIS — E119 Type 2 diabetes mellitus without complications: Secondary | ICD-10-CM | POA: Diagnosis not present

## 2019-11-02 DIAGNOSIS — E669 Obesity, unspecified: Secondary | ICD-10-CM | POA: Diagnosis not present

## 2019-11-02 DIAGNOSIS — E039 Hypothyroidism, unspecified: Secondary | ICD-10-CM | POA: Diagnosis not present

## 2019-11-02 DIAGNOSIS — R296 Repeated falls: Secondary | ICD-10-CM | POA: Diagnosis not present

## 2019-11-02 DIAGNOSIS — N400 Enlarged prostate without lower urinary tract symptoms: Secondary | ICD-10-CM | POA: Diagnosis not present

## 2019-11-02 DIAGNOSIS — M810 Age-related osteoporosis without current pathological fracture: Secondary | ICD-10-CM | POA: Diagnosis not present

## 2019-11-02 DIAGNOSIS — R030 Elevated blood-pressure reading, without diagnosis of hypertension: Secondary | ICD-10-CM | POA: Diagnosis not present

## 2019-11-02 DIAGNOSIS — R69 Illness, unspecified: Secondary | ICD-10-CM | POA: Diagnosis not present

## 2019-11-02 DIAGNOSIS — N529 Male erectile dysfunction, unspecified: Secondary | ICD-10-CM | POA: Diagnosis not present

## 2019-11-02 DIAGNOSIS — D6869 Other thrombophilia: Secondary | ICD-10-CM | POA: Diagnosis not present

## 2019-11-02 DIAGNOSIS — I4891 Unspecified atrial fibrillation: Secondary | ICD-10-CM | POA: Diagnosis not present

## 2019-11-08 DIAGNOSIS — I4510 Unspecified right bundle-branch block: Secondary | ICD-10-CM | POA: Diagnosis not present

## 2019-11-08 DIAGNOSIS — E785 Hyperlipidemia, unspecified: Secondary | ICD-10-CM | POA: Diagnosis not present

## 2019-11-08 DIAGNOSIS — G2581 Restless legs syndrome: Secondary | ICD-10-CM | POA: Diagnosis not present

## 2019-11-08 DIAGNOSIS — I4891 Unspecified atrial fibrillation: Secondary | ICD-10-CM | POA: Diagnosis not present

## 2019-11-08 DIAGNOSIS — R69 Illness, unspecified: Secondary | ICD-10-CM | POA: Diagnosis not present

## 2019-11-08 DIAGNOSIS — R296 Repeated falls: Secondary | ICD-10-CM | POA: Diagnosis not present

## 2019-11-08 DIAGNOSIS — R001 Bradycardia, unspecified: Secondary | ICD-10-CM | POA: Diagnosis not present

## 2019-11-08 DIAGNOSIS — N400 Enlarged prostate without lower urinary tract symptoms: Secondary | ICD-10-CM | POA: Diagnosis not present

## 2019-11-08 DIAGNOSIS — S76891D Other injury of other specified muscles, fascia and tendons at thigh level, right thigh, subsequent encounter: Secondary | ICD-10-CM | POA: Diagnosis not present

## 2019-11-08 DIAGNOSIS — E039 Hypothyroidism, unspecified: Secondary | ICD-10-CM | POA: Diagnosis not present

## 2019-11-13 DIAGNOSIS — R296 Repeated falls: Secondary | ICD-10-CM | POA: Diagnosis not present

## 2019-11-13 DIAGNOSIS — I4510 Unspecified right bundle-branch block: Secondary | ICD-10-CM | POA: Diagnosis not present

## 2019-11-13 DIAGNOSIS — S76891D Other injury of other specified muscles, fascia and tendons at thigh level, right thigh, subsequent encounter: Secondary | ICD-10-CM | POA: Diagnosis not present

## 2019-11-13 DIAGNOSIS — E785 Hyperlipidemia, unspecified: Secondary | ICD-10-CM | POA: Diagnosis not present

## 2019-11-13 DIAGNOSIS — N400 Enlarged prostate without lower urinary tract symptoms: Secondary | ICD-10-CM | POA: Diagnosis not present

## 2019-11-13 DIAGNOSIS — I4891 Unspecified atrial fibrillation: Secondary | ICD-10-CM | POA: Diagnosis not present

## 2019-11-13 DIAGNOSIS — E039 Hypothyroidism, unspecified: Secondary | ICD-10-CM | POA: Diagnosis not present

## 2019-11-13 DIAGNOSIS — R001 Bradycardia, unspecified: Secondary | ICD-10-CM | POA: Diagnosis not present

## 2019-11-13 DIAGNOSIS — R69 Illness, unspecified: Secondary | ICD-10-CM | POA: Diagnosis not present

## 2019-11-13 DIAGNOSIS — G2581 Restless legs syndrome: Secondary | ICD-10-CM | POA: Diagnosis not present

## 2019-11-16 DIAGNOSIS — N400 Enlarged prostate without lower urinary tract symptoms: Secondary | ICD-10-CM | POA: Diagnosis not present

## 2019-11-16 DIAGNOSIS — R001 Bradycardia, unspecified: Secondary | ICD-10-CM | POA: Diagnosis not present

## 2019-11-16 DIAGNOSIS — G2581 Restless legs syndrome: Secondary | ICD-10-CM | POA: Diagnosis not present

## 2019-11-16 DIAGNOSIS — E785 Hyperlipidemia, unspecified: Secondary | ICD-10-CM | POA: Diagnosis not present

## 2019-11-16 DIAGNOSIS — S76891D Other injury of other specified muscles, fascia and tendons at thigh level, right thigh, subsequent encounter: Secondary | ICD-10-CM | POA: Diagnosis not present

## 2019-11-16 DIAGNOSIS — I4891 Unspecified atrial fibrillation: Secondary | ICD-10-CM | POA: Diagnosis not present

## 2019-11-16 DIAGNOSIS — E039 Hypothyroidism, unspecified: Secondary | ICD-10-CM | POA: Diagnosis not present

## 2019-11-16 DIAGNOSIS — R69 Illness, unspecified: Secondary | ICD-10-CM | POA: Diagnosis not present

## 2019-11-16 DIAGNOSIS — R296 Repeated falls: Secondary | ICD-10-CM | POA: Diagnosis not present

## 2019-11-16 DIAGNOSIS — I4510 Unspecified right bundle-branch block: Secondary | ICD-10-CM | POA: Diagnosis not present

## 2019-11-20 DIAGNOSIS — G2581 Restless legs syndrome: Secondary | ICD-10-CM | POA: Diagnosis not present

## 2019-11-20 DIAGNOSIS — R69 Illness, unspecified: Secondary | ICD-10-CM | POA: Diagnosis not present

## 2019-11-20 DIAGNOSIS — R296 Repeated falls: Secondary | ICD-10-CM | POA: Diagnosis not present

## 2019-11-20 DIAGNOSIS — I4510 Unspecified right bundle-branch block: Secondary | ICD-10-CM | POA: Diagnosis not present

## 2019-11-20 DIAGNOSIS — S76891D Other injury of other specified muscles, fascia and tendons at thigh level, right thigh, subsequent encounter: Secondary | ICD-10-CM | POA: Diagnosis not present

## 2019-11-20 DIAGNOSIS — E785 Hyperlipidemia, unspecified: Secondary | ICD-10-CM | POA: Diagnosis not present

## 2019-11-20 DIAGNOSIS — E039 Hypothyroidism, unspecified: Secondary | ICD-10-CM | POA: Diagnosis not present

## 2019-11-20 DIAGNOSIS — I4891 Unspecified atrial fibrillation: Secondary | ICD-10-CM | POA: Diagnosis not present

## 2019-11-20 DIAGNOSIS — N400 Enlarged prostate without lower urinary tract symptoms: Secondary | ICD-10-CM | POA: Diagnosis not present

## 2019-11-20 DIAGNOSIS — R001 Bradycardia, unspecified: Secondary | ICD-10-CM | POA: Diagnosis not present

## 2019-11-22 DIAGNOSIS — R69 Illness, unspecified: Secondary | ICD-10-CM | POA: Diagnosis not present

## 2019-11-22 DIAGNOSIS — R296 Repeated falls: Secondary | ICD-10-CM | POA: Diagnosis not present

## 2019-11-22 DIAGNOSIS — R001 Bradycardia, unspecified: Secondary | ICD-10-CM | POA: Diagnosis not present

## 2019-11-22 DIAGNOSIS — I4891 Unspecified atrial fibrillation: Secondary | ICD-10-CM | POA: Diagnosis not present

## 2019-11-22 DIAGNOSIS — G2581 Restless legs syndrome: Secondary | ICD-10-CM | POA: Diagnosis not present

## 2019-11-22 DIAGNOSIS — S76891D Other injury of other specified muscles, fascia and tendons at thigh level, right thigh, subsequent encounter: Secondary | ICD-10-CM | POA: Diagnosis not present

## 2019-11-22 DIAGNOSIS — I4510 Unspecified right bundle-branch block: Secondary | ICD-10-CM | POA: Diagnosis not present

## 2019-11-22 DIAGNOSIS — E785 Hyperlipidemia, unspecified: Secondary | ICD-10-CM | POA: Diagnosis not present

## 2019-11-22 DIAGNOSIS — N400 Enlarged prostate without lower urinary tract symptoms: Secondary | ICD-10-CM | POA: Diagnosis not present

## 2019-11-22 DIAGNOSIS — E039 Hypothyroidism, unspecified: Secondary | ICD-10-CM | POA: Diagnosis not present

## 2019-11-29 DIAGNOSIS — R69 Illness, unspecified: Secondary | ICD-10-CM | POA: Diagnosis not present

## 2019-11-29 DIAGNOSIS — E785 Hyperlipidemia, unspecified: Secondary | ICD-10-CM | POA: Diagnosis not present

## 2019-11-29 DIAGNOSIS — S76891D Other injury of other specified muscles, fascia and tendons at thigh level, right thigh, subsequent encounter: Secondary | ICD-10-CM | POA: Diagnosis not present

## 2019-11-29 DIAGNOSIS — R001 Bradycardia, unspecified: Secondary | ICD-10-CM | POA: Diagnosis not present

## 2019-11-29 DIAGNOSIS — I4510 Unspecified right bundle-branch block: Secondary | ICD-10-CM | POA: Diagnosis not present

## 2019-11-29 DIAGNOSIS — I4891 Unspecified atrial fibrillation: Secondary | ICD-10-CM | POA: Diagnosis not present

## 2019-11-29 DIAGNOSIS — R296 Repeated falls: Secondary | ICD-10-CM | POA: Diagnosis not present

## 2019-11-29 DIAGNOSIS — N400 Enlarged prostate without lower urinary tract symptoms: Secondary | ICD-10-CM | POA: Diagnosis not present

## 2019-11-29 DIAGNOSIS — E039 Hypothyroidism, unspecified: Secondary | ICD-10-CM | POA: Diagnosis not present

## 2019-11-29 DIAGNOSIS — G2581 Restless legs syndrome: Secondary | ICD-10-CM | POA: Diagnosis not present

## 2019-12-06 ENCOUNTER — Telehealth: Payer: Self-pay | Admitting: Cardiology

## 2019-12-06 DIAGNOSIS — I4510 Unspecified right bundle-branch block: Secondary | ICD-10-CM | POA: Diagnosis not present

## 2019-12-06 DIAGNOSIS — R69 Illness, unspecified: Secondary | ICD-10-CM | POA: Diagnosis not present

## 2019-12-06 DIAGNOSIS — I4891 Unspecified atrial fibrillation: Secondary | ICD-10-CM | POA: Diagnosis not present

## 2019-12-06 DIAGNOSIS — R001 Bradycardia, unspecified: Secondary | ICD-10-CM | POA: Diagnosis not present

## 2019-12-06 DIAGNOSIS — R296 Repeated falls: Secondary | ICD-10-CM | POA: Diagnosis not present

## 2019-12-06 DIAGNOSIS — G2581 Restless legs syndrome: Secondary | ICD-10-CM | POA: Diagnosis not present

## 2019-12-06 DIAGNOSIS — E039 Hypothyroidism, unspecified: Secondary | ICD-10-CM | POA: Diagnosis not present

## 2019-12-06 DIAGNOSIS — E785 Hyperlipidemia, unspecified: Secondary | ICD-10-CM | POA: Diagnosis not present

## 2019-12-06 DIAGNOSIS — N400 Enlarged prostate without lower urinary tract symptoms: Secondary | ICD-10-CM | POA: Diagnosis not present

## 2019-12-06 DIAGNOSIS — S76891D Other injury of other specified muscles, fascia and tendons at thigh level, right thigh, subsequent encounter: Secondary | ICD-10-CM | POA: Diagnosis not present

## 2019-12-06 NOTE — Telephone Encounter (Signed)
Spoke with son regarding patient 2 months ago fell hematoma, better Swelling in legs,  in the morning look good w/in 3 hours swelling Inactive, no change in diet. Sits with feet down No change in breathing, does get winded up and around. He goes to [employer] without any issues Son states he has heard some wheezing at times Per son O2 sats 97-98%, blood pressure and HR good  Won't use his support stockings  Advised son to watch sodium intake, keep legs elevated, and keep follow up as scheduled 5/24. Call back if worse  Will forward to Dr [name] for review

## 2019-12-06 NOTE — Telephone Encounter (Signed)
Pt c/o swelling: STAT is pt has developed SOB within 24 hours  1) How much weight have you gained and in what time span? no  2) If swelling, where is the swelling located? legs  3) Are you currently taking a fluid pill?  no  4) Are you currently SOB? No more than normal  5) Do you have a log of your daily weights (if so, list)? no  6) Have you gained 3 pounds in a day or 5 pounds in a week? no  7) Have you traveled recently?  no

## 2019-12-07 NOTE — Telephone Encounter (Signed)
Agree with plan Carman Essick  

## 2019-12-17 NOTE — Progress Notes (Signed)
Virtual Visit via Video Note   This visit type was conducted due to national recommendations for restrictions regarding the COVID-19 Pandemic (e.g. social distancing) in an effort to limit this patient's exposure and mitigate transmission in our community.  Due to his co-morbid illnesses, this patient is at least at moderate risk for complications without adequate follow up.  This format is felt to be most appropriate for this patient at this time.  All issues noted in this document were discussed and addressed.  A limited physical exam was performed with this format.  Please refer to the patient's chart for his consent to telehealth for [facility].   Date:  12/17/2019   ID:  Travis Bradford, DOB [birth year], MRN [id number]  Patient Location: Home Provider Location: Home  PCP:  Patient, No Pcp Per  Cardiologist:  Dr.Crenshaw  Electrophysiologist:  None   Evaluation Performed:  Follow-Up Visit  Chief Complaint: Follow Up   History of Present Illness:    Travis Bradford is a 86 y.o. male we are following for ongoing assessment and management of atrial fibrillation on Eliquis, 5 mg twice daily, carotid arterial disease, history of mildly dilated aortic root and ascending aorta per echocardiogram in September 2018.  Does not tolerate beta-blockers for rate control due to excessive fatigue and is now on diltiazem.  When last seen by Dr. [name] on 09/26/2019 his rate was well controlled he was continued on anticoagulation without complaints of bleeding or excessive bruising.  He was being treated conservatively at that time.  Unfortunately he was admitted to the hospital on 10/12/2019 in the setting of traumatic hematoma of the right thigh.  He was found to have a 7 cm x 15 cm x 3.5 cm on CT scan.  He underwent irrigation and debridement of deep hematoma of the right leg by Dr. [name] on 10/12/2019.  Eliquis was held perioperatively.  Postoperatively the patient did undergo physical therapy.   Patient does not like to wear support hose.  On 12/06/2019 his son called stating that he was having some lower extremity edema.  He was not having any shortness of breath wheezing or cough.  Edema was noted with dependent position.  He was advised on low-sodium diet and to keep legs elevated as much as possible.  I am seeing the patient with his son today on video visit.  His son is very informed have an gives most of the information.  He states that the lower extremity edema is better.  The patient is eating well medically compliant.  His incision to remove the hematoma is healing well without evidence of infection.  Mr. Burno refuses CPAP.  His energy level is fair.  He is not very active.  He does sit a lot with his feet hanging down which does tend to exacerbate dependent edema.  On speaking with the patient he is very hard of hearing but responds appropriately and is without any voiced complaints.  His son states that he no longer has a primary care physician.  He depends on Dr. [name] for the majority of his needs.  He is not inclined to see physicians unless he absolutely needs to.  The patient does not have symptoms concerning for COVID-19 infection (fever, chills, cough, or new shortness of breath).    Past Medical History:  Diagnosis Date  . Abnormal stress test    a. 07/2012: abnormal stress echo showing hypokinesis of the mid-distal inferior wall suggestive of ischemia; + frequent PVCs, no ST  changes or CP. Dr. [name] had a long discussion with the patient about further w/u versus medical therapy and the patient elected medical therapy..  . Arthritis   . Atrial fibrillation ([place])    a. Dx 01/2016.  [name] BPH (benign prostatic hyperplasia)   . History of blood transfusion   . Hyperlipidemia   . Hypothyroid   . Lymphoma ([place])    15 years ago  . Pneumonia   . Prostate cancer ([place])   . PVC's (premature ventricular contractions)    a. 07/2012 - seen on stress echo.  [name] RBBB    a. h/o  intermittent rbbb.  . Restless legs syndrome   . Sinus bradycardia   . Sleep apnea    does not use a cpap   Past Surgical History:  Procedure Laterality Date  . APPENDECTOMY    . Athroscopic knee surgery    . COLONOSCOPY    . dental implants    . GREEN LIGHT LASER TURP (TRANSURETHRAL RESECTION OF PROSTATE  08/29/2012   Procedure: GREEN LIGHT LASER TURP (TRANSURETHRAL RESECTION OF PROSTATE;  Surgeon: [name], MD;  Location: [facility];  Service: Urology;  Laterality: N/A;  . HEMATOMA EVACUATION Right 10/11/2019   Procedure: RIGHT LEG EVACUATION OF DEEP HEMATOMA;  Surgeon: [name], MD;  Location: [place];  Service: Orthopedics;  Laterality: Right;  . MOHS SURGERY     x 6     No outpatient medications have been marked as taking for the 12/18/19 encounter (Appointment) with Lendon Colonel, NP.     Allergies:   Patient has no known allergies.   Social History   Tobacco Use  . Smoking status: Former Research scientist (life sciences)  . Smokeless tobacco: Never Used  Substance Use Topics  . Alcohol use: Not Currently    Comment: Rarely  . Drug use: Never     Family Hx: The patient's family history includes CAD in his mother; Stroke in his brother.  ROS:   Please see the history of present illness.    All other systems reviewed and are negative.   Prior CV studies:   The following studies were reviewed today: Echocardiogram 9/24/20219  Left ventricle: The cavity size was normal. Systolic function was  normal. The estimated ejection fraction was in the range of 55%  to 60%. Wall motion was normal; there were no regional wall  motion abnormalities. The study is not technically sufficient to  allow evaluation of LV diastolic function.  - Aorta: Aortic root dimension: 41 mm (ED).  - Ascending aorta: The ascending aorta was mildly dilated.  - Mitral valve: Calcified annulus.  - Left atrium: The atrium was mildly dilated.  - Right ventricle: The cavity size was mildly dilated.  Wall  thickness was normal.  - Tricuspid valve: There was mild regurgitation.   Labs/Other Tests and Data Reviewed:    EKG:  No ECG reviewed.  Recent Labs: 10/11/2019: BUN 22; Creatinine, Ser 0.96; Hemoglobin 12.1; Platelets 359; Potassium 4.2; Sodium 134   Recent Lipid Panel No results found for: CHOL, TRIG, HDL, CHOLHDL, LDLCALC, LDLDIRECT  Wt Readings from Last 3 Encounters:  10/11/19 200 lb (90.7 kg)  08/29/19 223 lb (101.2 kg)  04/17/19 223 lb (101.2 kg)     Objective:    Vital Signs:  There were no vitals taken for this visit.   VITAL SIGNS:  reviewed GEN:  no acute distress EYES:  sclerae anicteric, EOMI - Extraocular Movements Intact RESPIRATORY:  normal respiratory effort, symmetric expansion NEURO:  Hard of hearing  PSYCH:  normal affect  ASSESSMENT & PLAN:    1.  Atrial fibrillation: His son monitors his heart rate and states that it is normal.  He uses a O2 sat monitor to evaluate pulse rhythm.  He continues on diltiazem, and Eliquis and his son provides this medication for him.  The patient offers no complaints of worsening shortness of breath or palpitations.  I will check a CBC on follow-up for ongoing evaluation.  2.  Hypertension: Uncertain of blood pressure today.  Most recent recording reveals 124/80.  No changes in his regimen at this time.  BMET is being ordered.  Most recent echocardiogram revealed normal LV systolic function.  Lower extremity edema may likely be dependent and also due to calcium channel blocker therapy.  His son is stating that it is improved.  Keeping legs elevated would be helpful.  He does not wish to wear therapeutic compression hose.  3.  Hypothyroidism: Currently on Synthroid.  Has not had levels checked recently.  Will check TSH with lab draws.  4.  OSA: Refuses CPAP.  Intolerant.  Does not wish to consider other options at this time.  COVID-19 Education: The signs and symptoms of COVID-19 were discussed with the patient and  how to seek care for testing (follow up with PCP or arrange E-visit). The importance of social distancing was discussed today.  Time:   Today, I have spent 25 minutes with the patient with telehealth technology discussing the above problems.     Medication Adjustments/Labs and Tests Ordered: Current medicines are reviewed at length with the patient today.  Concerns regarding medicines are outlined above.   Tests Ordered: No orders of the defined types were placed in this encounter.   Medication Changes: No orders of the defined types were placed in this encounter.   Disposition:  Follow up 3 months  Signed, Phill Myron. [place] Pugh, ANP, AACC  12/17/2019 8:11 AM    [place]

## 2019-12-18 ENCOUNTER — Telehealth (INDEPENDENT_AMBULATORY_CARE_PROVIDER_SITE_OTHER): Payer: Medicare HMO | Admitting: Adult Health

## 2019-12-18 ENCOUNTER — Encounter: Payer: Self-pay | Admitting: Adult Health

## 2019-12-18 VITALS — BP 118/72 | HR 58 | Temp 98.1°F | Ht 69.0 in | Wt 217.0 lb

## 2019-12-18 DIAGNOSIS — G4733 Obstructive sleep apnea (adult) (pediatric): Secondary | ICD-10-CM

## 2019-12-18 DIAGNOSIS — I10 Essential (primary) hypertension: Secondary | ICD-10-CM | POA: Diagnosis not present

## 2019-12-18 DIAGNOSIS — Z79899 Other long term (current) drug therapy: Secondary | ICD-10-CM

## 2019-12-18 DIAGNOSIS — E039 Hypothyroidism, unspecified: Secondary | ICD-10-CM | POA: Diagnosis not present

## 2019-12-18 DIAGNOSIS — I4819 Other persistent atrial fibrillation: Secondary | ICD-10-CM

## 2019-12-18 NOTE — Patient Instructions (Signed)
Medication Instructions:  Continue current medications  *If you need a refill on your cardiac medications before your next appointment, please call your pharmacy*   Lab Work: CBC, BMP and TSH  If you have labs (blood work) drawn today and your tests are completely normal, you will receive your results only by: [name] MyChart Message (if you have MyChart) OR . A paper copy in the mail If you have any lab test that is abnormal or we need to change your treatment, we will call you to review the results.   Testing/Procedures: None Ordered   Follow-Up: At [facility], you and your health needs are our priority.  As part of our continuing mission to provide you with exceptional heart care, we have created designated Provider Care Teams.  These Care Teams include your primary Cardiologist (physician) and Advanced Practice Providers (APPs -  Physician Assistants and Nurse Practitioners) who all work together to provide you with the care you need, when you need it.  We recommend signing up for the patient portal called "MyChart".  Sign up information is provided on this After Visit Summary.  MyChart is used to connect with patients for Virtual Visits (Telemedicine).  Patients are able to view lab/test results, encounter notes, upcoming appointments, etc.  Non-urgent messages can be sent to your provider as well.   To learn more about what you can do with MyChart, go to [email or web address].    Your next appointment:   3 month(s)  The format for your next appointment:   In Person  Provider:   You may see [name], MD or one of the following Advanced Practice Providers on your designated Care Team:    Kerin Ransom, PA-C  [place], [place]  Coletta Memos, [place]

## 2019-12-20 DIAGNOSIS — Z79899 Other long term (current) drug therapy: Secondary | ICD-10-CM | POA: Diagnosis not present

## 2019-12-20 DIAGNOSIS — I4819 Other persistent atrial fibrillation: Secondary | ICD-10-CM | POA: Diagnosis not present

## 2019-12-21 LAB — CBC
Hematocrit: 37.7 % (ref 37.5–51.0)
Hemoglobin: 13 g/dL (ref 13.0–17.7)
MCH: 33.6 pg — ABNORMAL HIGH (ref 26.6–33.0)
MCHC: 34.5 g/dL (ref 31.5–35.7)
MCV: 97 fL (ref 79–97)
Platelets: 327 10*3/uL (ref 150–450)
RBC: 3.87 x10E6/uL — ABNORMAL LOW (ref 4.14–5.80)
RDW: 11.8 % (ref 11.6–15.4)
WBC: 10.2 10*3/uL (ref 3.4–10.8)

## 2019-12-21 LAB — TSH: TSH: 3.59 u[IU]/mL (ref 0.450–4.500)

## 2019-12-21 LAB — BASIC METABOLIC PANEL
BUN/Creatinine Ratio: 20 (ref 10–24)
BUN: 24 mg/dL (ref 10–36)
CO2: 22 mmol/L (ref 20–29)
Calcium: 9.7 mg/dL (ref 8.6–10.2)
Chloride: 99 mmol/L (ref 96–106)
Creatinine, Ser: 1.2 mg/dL (ref 0.76–1.27)
GFR calc Af Amer: 61 mL/min/{1.73_m2} (ref >59)
GFR calc non Af Amer: 53 mL/min/{1.73_m2} — ABNORMAL LOW (ref >59)
Glucose: 139 mg/dL — ABNORMAL HIGH (ref 65–99)
Potassium: 4.8 mmol/L (ref 3.5–5.2)
Sodium: 137 mmol/L (ref 134–144)

## 2020-02-01 DIAGNOSIS — R69 Illness, unspecified: Secondary | ICD-10-CM | POA: Diagnosis not present

## 2020-02-23 DIAGNOSIS — B078 Other viral warts: Secondary | ICD-10-CM | POA: Diagnosis not present

## 2020-02-23 DIAGNOSIS — L570 Actinic keratosis: Secondary | ICD-10-CM | POA: Diagnosis not present

## 2020-02-23 DIAGNOSIS — D485 Neoplasm of uncertain behavior of skin: Secondary | ICD-10-CM | POA: Diagnosis not present

## 2020-02-23 DIAGNOSIS — L905 Scar conditions and fibrosis of skin: Secondary | ICD-10-CM | POA: Diagnosis not present

## 2020-02-23 DIAGNOSIS — Z85828 Personal history of other malignant neoplasm of skin: Secondary | ICD-10-CM | POA: Diagnosis not present

## 2020-02-23 DIAGNOSIS — L281 Prurigo nodularis: Secondary | ICD-10-CM | POA: Diagnosis not present

## 2020-02-29 ENCOUNTER — Telehealth: Payer: Self-pay | Admitting: *Deleted

## 2020-02-29 NOTE — Telephone Encounter (Signed)
A detailed message was left,re: his follow up visit. °

## 2020-03-25 ENCOUNTER — Telehealth: Payer: Self-pay | Admitting: Cardiology

## 2020-03-25 NOTE — Telephone Encounter (Signed)
Called son back- regarding his father, he states his father does not have a PCP currently, and they are moving him from one [place] to another in [place] and he will not see him until a while out 6 weeks out and they do not know what to do in this time frame.  His states his O2 states are in the 80's, but he does feel that he needs O2 at home since his O2 is dropping this bad.   He would like for me to send this back to Dr.Crenshaw, and Debra, RN to make them aware, and get recommendations patient son said maybe an urgent care-he would like to know if this is an option, I advised I did not think they would do the O2 orders.

## 2020-03-25 NOTE — Telephone Encounter (Signed)
Left message for patient with Dr Creshaw's recommendations.   

## 2020-03-25 NOTE — Telephone Encounter (Signed)
Patient's son calling stating the patient's oxygen has been low. He states now it is okay, but the other day it dropped to 84. He states his HR was also in the 40's, but now it is in the 60's. He would like to know if he can get supplemental oxygen to keep at home.

## 2020-03-25 NOTE — Telephone Encounter (Signed)
Called patients son, [name] and [facility] regarding his fathers recent issues.

## 2020-03-25 NOTE — Telephone Encounter (Signed)
Patient returning phone call 

## 2020-03-25 NOTE — Telephone Encounter (Signed)
Would need to be prescribed by his primary care physician or [facility]. [name]

## 2020-03-25 NOTE — Telephone Encounter (Signed)
Patient's son returning call. 

## 2020-03-25 NOTE — Telephone Encounter (Signed)
Called back and spoke with the patients son, [name]. He reports that patient has had 3 sleep studies done but refuses to use his CPAP. His oxygenation is lower in the mornings when he wakes up. He states now it is okay, but the other day it dropped to 63 last Thursday. He states his HR was also in the 40's at this time, but now it is in the 60's. The home health CNA reports that last week, the patient was "foggy". Pts son states he is doing better this week but is occasionally SOB with exertion. He was seen at the [place] last week and got a good report.    He would like to know if he can get supplemental oxygen to keep at home.

## 2020-03-28 NOTE — Telephone Encounter (Signed)
Left message for patient son to call and schedule an appointment for the 02 testing but maybe 4-6 weeks before we can see him as well. If the patient has 02 sats in the 80's and he is having trouble breathing he needs to take him somewhere to be evaluated. He is to call back to schedule appointment.

## 2020-05-10 ENCOUNTER — Other Ambulatory Visit: Payer: Self-pay

## 2020-05-10 ENCOUNTER — Ambulatory Visit (INDEPENDENT_AMBULATORY_CARE_PROVIDER_SITE_OTHER): Payer: Medicare HMO | Admitting: Otolaryngology

## 2020-05-10 VITALS — Temp 97.7°F

## 2020-05-10 DIAGNOSIS — K120 Recurrent oral aphthae: Secondary | ICD-10-CM | POA: Diagnosis not present

## 2020-05-10 NOTE — Progress Notes (Signed)
HPI: Mat Stuard is a 86 y.o. male who returns today for evaluation of a sore on the right side of his tongue that has had for little over a week.  He thought he might have bitten his tongue.  The sore is getting a little bit better..  Past Medical History:  Diagnosis Date  . Abnormal stress test    a. 07/2012: abnormal stress echo showing hypokinesis of the mid-distal inferior wall suggestive of ischemia; + frequent PVCs, no ST changes or CP. Dr. [name] had a long discussion with the patient about further w/u versus medical therapy and the patient elected medical therapy..  . Arthritis   . Atrial fibrillation ([place])    a. Dx 01/2016.  [name] BPH (benign prostatic hyperplasia)   . History of blood transfusion   . Hyperlipidemia   . Hypothyroid   . Lymphoma ([place])    15 years ago  . Pneumonia   . Prostate cancer ([place])   . PVC's (premature ventricular contractions)    a. 07/2012 - seen on stress echo.  [name] RBBB    a. h/o intermittent rbbb.  . Restless legs syndrome   . Sinus bradycardia   . Sleep apnea    does not use a cpap   Past Surgical History:  Procedure Laterality Date  . APPENDECTOMY    . Athroscopic knee surgery    . COLONOSCOPY    . dental implants    . GREEN LIGHT LASER TURP (TRANSURETHRAL RESECTION OF PROSTATE  08/29/2012   Procedure: GREEN LIGHT LASER TURP (TRANSURETHRAL RESECTION OF PROSTATE;  Surgeon: [name], MD;  [facility];  Service: Urology;  Laterality: N/A;  . HEMATOMA EVACUATION Right 10/11/2019   Procedure: RIGHT LEG EVACUATION OF DEEP HEMATOMA;  Surgeon: [name], MD;  Location: [place];  Service: Orthopedics;  Laterality: Right;  . MOHS SURGERY     x 6   Social History   Socioeconomic History  . Marital status: Widowed    Spouse name: Not on file  . Number of children: 2  . Years of education: Not on file  . Highest education level: Not on file  Occupational History  . Not on file  Tobacco Use  . Smoking status: Former Research scientist (life sciences)  .  Smokeless tobacco: Never Used  Vaping Use  . Vaping Use: Never used  Substance and Sexual Activity  . Alcohol use: Not Currently    Comment: Rarely  . Drug use: Never  . Sexual activity: Not on file  Other Topics Concern  . Not on file  Social History Narrative  . Not on file   Social Determinants of Health   Financial Resource Strain:   . Difficulty of Paying Living Expenses: Not on file  Food Insecurity:   . Worried About Charity fundraiser in the Last Year: Not on file  . Ran Out of Food in the Last Year: Not on file  Transportation Needs:   . Lack of Transportation (Medical): Not on file  . Lack of Transportation (Non-Medical): Not on file  Physical Activity:   . Days of Exercise per Week: Not on file  . Minutes of Exercise per Session: Not on file  Stress:   . Feeling of Stress : Not on file  Social Connections:   . Frequency of Communication with Friends and Family: Not on file  . Frequency of Social Gatherings with Friends and Family: Not on file  . Attends Religious Services: Not on file  . Active Member  of Clubs or Organizations: Not on file  . Attends Archivist Meetings: Not on file  . Marital Status: Not on file   Family History  Problem Relation Age of Onset  . CAD Mother   . Stroke Brother         X 2   No Known Allergies Prior to Admission medications   Medication Sig Start Date End Date Taking? Authorizing Provider  apixaban (ELIQUIS) 5 MG TABS tablet Take 5 mg by mouth 2 (two) times daily.   Yes [provider]  diltiazem (CARDIZEM CD) 120 MG 24 hr capsule Take 1 capsule (120 mg total) by mouth daily. 02/21/19  Yes Lelon Perla, MD  ergocalciferol (VITAMIN D2) 50000 UNITS capsule Take 50,000 Units by mouth once a week.   Yes [provider]  levothyroxine (SYNTHROID) 25 MCG tablet Take 25 mcg by mouth daily.    Yes [provider]  ropinirole (REQUIP) 5 MG tablet Take 5 mg by mouth at bedtime. 2100   Yes  [provider]     Positive ROS: Otherwise negative  All other systems have been reviewed and were otherwise negative with the exception of those mentioned in the HPI and as above.  Physical Exam: Constitutional: Alert, well-appearing, no acute distress Ears: External ears without lesions or tenderness. Ear canals are clear bilaterally with intact, clear TMs.  Nasal: External nose without lesions. . Clear nasal passages Oral: Lips and gums without lesions.  On oral examination patient has a small canker sore on the right lateral tongue which is soft to palpation.  Measures only 2 to 3 mm in size.  Remaining oral mucosa was normal.  Posterior oral cavity was clear. Neck: No palpable adenopathy or masses Respiratory: Breathing comfortably  Skin: No facial/neck lesions or rash noted.  Procedures  Assessment: Right lateral tongue aphthous ulcer  Plan: Discussed using topical numbing medicine such as Zilactin.  This should gradually resolve over the following 1 to 2 weeks.   Radene Journey, MD

## 2020-05-11 ENCOUNTER — Other Ambulatory Visit: Payer: Self-pay

## 2020-05-11 ENCOUNTER — Emergency Department (HOSPITAL_COMMUNITY): Payer: Medicare HMO

## 2020-05-11 ENCOUNTER — Inpatient Hospital Stay (HOSPITAL_COMMUNITY)
Admission: EM | Admit: 2020-05-11 | Discharge: 2020-05-14 | DRG: 310 | Disposition: A | Discharge status: Home Health | Payer: Medicare HMO | Attending: Internal Medicine | Admitting: Internal Medicine

## 2020-05-11 ENCOUNTER — Encounter (HOSPITAL_COMMUNITY): Payer: Self-pay | Admitting: Cardiology

## 2020-05-11 DIAGNOSIS — I4821 Permanent atrial fibrillation: Secondary | ICD-10-CM | POA: Diagnosis not present

## 2020-05-11 DIAGNOSIS — I452 Bifascicular block: Secondary | ICD-10-CM | POA: Diagnosis present

## 2020-05-11 DIAGNOSIS — Z923 Personal history of irradiation: Secondary | ICD-10-CM

## 2020-05-11 DIAGNOSIS — R2681 Unsteadiness on feet: Secondary | ICD-10-CM

## 2020-05-11 DIAGNOSIS — J342 Deviated nasal septum: Secondary | ICD-10-CM | POA: Diagnosis not present

## 2020-05-11 DIAGNOSIS — I129 Hypertensive chronic kidney disease with stage 1 through stage 4 chronic kidney disease, or unspecified chronic kidney disease: Secondary | ICD-10-CM | POA: Diagnosis not present

## 2020-05-11 DIAGNOSIS — N1831 Chronic kidney disease, stage 3a: Secondary | ICD-10-CM | POA: Diagnosis not present

## 2020-05-11 DIAGNOSIS — Z79890 Hormone replacement therapy (postmenopausal): Secondary | ICD-10-CM

## 2020-05-11 DIAGNOSIS — R52 Pain, unspecified: Secondary | ICD-10-CM | POA: Diagnosis not present

## 2020-05-11 DIAGNOSIS — Z7901 Long term (current) use of anticoagulants: Secondary | ICD-10-CM

## 2020-05-11 DIAGNOSIS — E1165 Type 2 diabetes mellitus with hyperglycemia: Secondary | ICD-10-CM | POA: Diagnosis present

## 2020-05-11 DIAGNOSIS — S0511XA Contusion of eyeball and orbital tissues, right eye, initial encounter: Secondary | ICD-10-CM | POA: Diagnosis not present

## 2020-05-11 DIAGNOSIS — W06XXXA Fall from bed, initial encounter: Secondary | ICD-10-CM | POA: Diagnosis present

## 2020-05-11 DIAGNOSIS — Z8572 Personal history of non-Hodgkin lymphomas: Secondary | ICD-10-CM

## 2020-05-11 DIAGNOSIS — R5381 Other malaise: Secondary | ICD-10-CM | POA: Diagnosis present

## 2020-05-11 DIAGNOSIS — R0902 Hypoxemia: Secondary | ICD-10-CM | POA: Diagnosis not present

## 2020-05-11 DIAGNOSIS — I739 Peripheral vascular disease, unspecified: Secondary | ICD-10-CM | POA: Diagnosis not present

## 2020-05-11 DIAGNOSIS — Z9079 Acquired absence of other genital organ(s): Secondary | ICD-10-CM | POA: Diagnosis not present

## 2020-05-11 DIAGNOSIS — J341 Cyst and mucocele of nose and nasal sinus: Secondary | ICD-10-CM | POA: Diagnosis not present

## 2020-05-11 DIAGNOSIS — G2581 Restless legs syndrome: Secondary | ICD-10-CM | POA: Diagnosis present

## 2020-05-11 DIAGNOSIS — R2689 Other abnormalities of gait and mobility: Secondary | ICD-10-CM | POA: Diagnosis present

## 2020-05-11 DIAGNOSIS — R451 Restlessness and agitation: Secondary | ICD-10-CM | POA: Diagnosis not present

## 2020-05-11 DIAGNOSIS — S0990XA Unspecified injury of head, initial encounter: Secondary | ICD-10-CM | POA: Diagnosis not present

## 2020-05-11 DIAGNOSIS — Z79899 Other long term (current) drug therapy: Secondary | ICD-10-CM

## 2020-05-11 DIAGNOSIS — E785 Hyperlipidemia, unspecified: Secondary | ICD-10-CM | POA: Diagnosis not present

## 2020-05-11 DIAGNOSIS — R001 Bradycardia, unspecified: Secondary | ICD-10-CM | POA: Diagnosis not present

## 2020-05-11 DIAGNOSIS — N400 Enlarged prostate without lower urinary tract symptoms: Secondary | ICD-10-CM | POA: Diagnosis present

## 2020-05-11 DIAGNOSIS — Z9119 Patient's noncompliance with other medical treatment and regimen: Secondary | ICD-10-CM

## 2020-05-11 DIAGNOSIS — Z8546 Personal history of malignant neoplasm of prostate: Secondary | ICD-10-CM

## 2020-05-11 DIAGNOSIS — R42 Dizziness and giddiness: Secondary | ICD-10-CM

## 2020-05-11 DIAGNOSIS — Z20822 Contact with and (suspected) exposure to covid-19: Secondary | ICD-10-CM | POA: Diagnosis not present

## 2020-05-11 DIAGNOSIS — R030 Elevated blood-pressure reading, without diagnosis of hypertension: Secondary | ICD-10-CM

## 2020-05-11 DIAGNOSIS — I4891 Unspecified atrial fibrillation: Secondary | ICD-10-CM | POA: Diagnosis not present

## 2020-05-11 DIAGNOSIS — E039 Hypothyroidism, unspecified: Secondary | ICD-10-CM | POA: Diagnosis present

## 2020-05-11 DIAGNOSIS — D649 Anemia, unspecified: Secondary | ICD-10-CM | POA: Diagnosis present

## 2020-05-11 DIAGNOSIS — R531 Weakness: Secondary | ICD-10-CM | POA: Diagnosis not present

## 2020-05-11 DIAGNOSIS — E1122 Type 2 diabetes mellitus with diabetic chronic kidney disease: Secondary | ICD-10-CM | POA: Diagnosis present

## 2020-05-11 DIAGNOSIS — I6523 Occlusion and stenosis of bilateral carotid arteries: Secondary | ICD-10-CM | POA: Diagnosis not present

## 2020-05-11 DIAGNOSIS — Z87891 Personal history of nicotine dependence: Secondary | ICD-10-CM | POA: Diagnosis not present

## 2020-05-11 DIAGNOSIS — G4733 Obstructive sleep apnea (adult) (pediatric): Secondary | ICD-10-CM | POA: Diagnosis present

## 2020-05-11 DIAGNOSIS — W19XXXA Unspecified fall, initial encounter: Secondary | ICD-10-CM

## 2020-05-11 DIAGNOSIS — D631 Anemia in chronic kidney disease: Secondary | ICD-10-CM | POA: Diagnosis present

## 2020-05-11 DIAGNOSIS — G4730 Sleep apnea, unspecified: Secondary | ICD-10-CM

## 2020-05-11 DIAGNOSIS — G4761 Periodic limb movement disorder: Secondary | ICD-10-CM | POA: Diagnosis present

## 2020-05-11 DIAGNOSIS — Z9221 Personal history of antineoplastic chemotherapy: Secondary | ICD-10-CM

## 2020-05-11 DIAGNOSIS — I708 Atherosclerosis of other arteries: Secondary | ICD-10-CM | POA: Diagnosis not present

## 2020-05-11 DIAGNOSIS — I959 Hypotension, unspecified: Secondary | ICD-10-CM | POA: Diagnosis not present

## 2020-05-11 HISTORY — DX: Unspecified atrial fibrillation: I48.91

## 2020-05-11 HISTORY — DX: Dizziness and giddiness: R42

## 2020-05-11 LAB — BASIC METABOLIC PANEL
Anion gap: 8 (ref 5–15)
BUN: 24 mg/dL — ABNORMAL HIGH (ref 8–23)
CO2: 24 mmol/L (ref 22–32)
Calcium: 9.2 mg/dL (ref 8.9–10.3)
Chloride: 103 mmol/L (ref 98–111)
Creatinine, Ser: 1.09 mg/dL (ref 0.61–1.24)
GFR, Estimated: 59 mL/min — ABNORMAL LOW (ref >60)
Glucose, Bld: 189 mg/dL — ABNORMAL HIGH (ref 70–99)
Potassium: 3.9 mmol/L (ref 3.5–5.1)
Sodium: 135 mmol/L (ref 135–145)

## 2020-05-11 LAB — URINALYSIS, ROUTINE W REFLEX MICROSCOPIC
Bilirubin Urine: NEGATIVE
Glucose, UA: NEGATIVE mg/dL
Hgb urine dipstick: NEGATIVE
Ketones, ur: NEGATIVE mg/dL
Leukocytes,Ua: NEGATIVE
Nitrite: NEGATIVE
Protein, ur: NEGATIVE mg/dL
Specific Gravity, Urine: 1.019 (ref 1.005–1.030)
pH: 5 (ref 5.0–8.0)

## 2020-05-11 LAB — RESP PANEL BY RT PCR (RSV, FLU A&B, COVID)
Influenza A by PCR: NEGATIVE
Influenza B by PCR: NEGATIVE
Respiratory Syncytial Virus by PCR: NEGATIVE
SARS Coronavirus 2 by RT PCR: NEGATIVE

## 2020-05-11 LAB — CBC WITH DIFFERENTIAL/PLATELET
Abs Immature Granulocytes: 0.04 10*3/uL (ref 0.00–0.07)
Basophils Absolute: 0 10*3/uL (ref 0.0–0.1)
Basophils Relative: 0 %
Eosinophils Absolute: 0.2 10*3/uL (ref 0.0–0.5)
Eosinophils Relative: 2 %
HCT: 38.3 % — ABNORMAL LOW (ref 39.0–52.0)
Hemoglobin: 12.1 g/dL — ABNORMAL LOW (ref 13.0–17.0)
Immature Granulocytes: 1 %
Lymphocytes Relative: 15 %
Lymphs Abs: 1.2 10*3/uL (ref 0.7–4.0)
MCH: 32.4 pg (ref 26.0–34.0)
MCHC: 31.6 g/dL (ref 30.0–36.0)
MCV: 102.4 fL — ABNORMAL HIGH (ref 80.0–100.0)
Monocytes Absolute: 0.7 10*3/uL (ref 0.1–1.0)
Monocytes Relative: 9 %
Neutro Abs: 5.8 10*3/uL (ref 1.7–7.7)
Neutrophils Relative %: 73 %
Platelets: 260 10*3/uL (ref 150–400)
RBC: 3.74 MIL/uL — ABNORMAL LOW (ref 4.22–5.81)
RDW: 13.1 % (ref 11.5–15.5)
WBC: 7.9 10*3/uL (ref 4.0–10.5)
nRBC: 0 % (ref 0.0–0.2)

## 2020-05-11 LAB — CBG MONITORING, ED: Glucose-Capillary: 179 mg/dL — ABNORMAL HIGH (ref 70–99)

## 2020-05-11 MED ORDER — ROPINIROLE HCL 1 MG PO TABS
2.5000 mg | ORAL_TABLET | Freq: Every day | ORAL | Status: DC
  Administered 2020-05-11 – 2020-05-13 (×3): 2.5 mg via ORAL
  Filled 2020-05-11 (×4): qty 1

## 2020-05-11 MED ORDER — ATROPINE SULFATE 1 MG/10ML IJ SOSY
0.5000 mg | PREFILLED_SYRINGE | INTRAMUSCULAR | Status: AC
  Administered 2020-05-11: 0.5 mg via INTRAVENOUS
  Filled 2020-05-11: qty 10

## 2020-05-11 MED ORDER — LEVOTHYROXINE SODIUM 25 MCG PO TABS
25.0000 ug | ORAL_TABLET | Freq: Every day | ORAL | Status: DC
  Filled 2020-05-11: qty 1

## 2020-05-11 MED ORDER — ACETAMINOPHEN 325 MG PO TABS: 650.0000 mg | ORAL_TABLET | Freq: Four times a day (QID) | ORAL | Status: DC | PRN

## 2020-05-11 MED ORDER — ACETAMINOPHEN 650 MG RE SUPP: 650.0000 mg | Freq: Four times a day (QID) | RECTAL | Status: DC | PRN

## 2020-05-11 MED ORDER — ROPINIROLE HCL 1 MG PO TABS
2.5000 mg | ORAL_TABLET | Freq: Two times a day (BID) | ORAL | Status: DC
  Filled 2020-05-11 (×2): qty 1

## 2020-05-11 MED ORDER — CALCIUM GLUCONATE-NACL 1-0.675 GM/50ML-% IV SOLN
1.0000 g | Freq: Once | INTRAVENOUS | Status: AC
  Administered 2020-05-11: 1000 mg via INTRAVENOUS
  Filled 2020-05-11: qty 50

## 2020-05-11 MED ORDER — APIXABAN 5 MG PO TABS
5.0000 mg | ORAL_TABLET | Freq: Two times a day (BID) | ORAL | Status: DC
  Administered 2020-05-11 – 2020-05-14 (×6): 5 mg via ORAL
  Filled 2020-05-11 (×6): qty 1

## 2020-05-11 MED ORDER — LEVOTHYROXINE SODIUM 25 MCG PO TABS
25.0000 ug | ORAL_TABLET | Freq: Every day | ORAL | Status: DC
  Administered 2020-05-12 – 2020-05-13 (×2): 25 ug via ORAL
  Filled 2020-05-11 (×3): qty 1

## 2020-05-11 MED ORDER — LORAZEPAM 2 MG/ML IJ SOLN
0.5000 mg | Freq: Once | INTRAMUSCULAR | Status: AC
  Administered 2020-05-11: 0.5 mg via INTRAVENOUS
  Filled 2020-05-11: qty 1

## 2020-05-11 NOTE — ED Provider Notes (Signed)
5:38 PM Care of the patient assumed at signout.  Patient remains in similar condition, has been nonambulatory, after he was too weak to walk. Reviewing the patient's remaining outstanding labs, no evidence for his weakness.  However, on rhythm strip, and on repeat EKG, the patient is found to have sustained periods of nonsustained electrical activity, which is likely contributing to his ongoing weakness. He is on Cardizem, history of A. fib. This was discussed with the patient's son, and with our cardiology colleagues.  Patient admitted for further monitoring, management.    Carmin Muskrat, MD 05/11/20 1739

## 2020-05-11 NOTE — ED Notes (Signed)
Pt having multiple episodes of bradycardia, MD notified, pads applied.

## 2020-05-11 NOTE — Progress Notes (Signed)
Pt agitated, climbing out of the bed, pulled telemetry leads off, stating he does not know where he is. Reoriented pt. Pt calmed down, on low bed. Ordered meds given. Resting comfortably. Will continue to monitor.

## 2020-05-11 NOTE — ED Provider Notes (Signed)
[facility] EMERGENCY DEPARTMENT Provider Note   CSN: [id number] Arrival date & time: 05/11/20  1258     History Chief Complaint  Patient presents with  . Fatigue  . Fall    Travis Bradford is a 86 y.o. male.  HPI     This is a 86 year old male with a history of atrial fibrillation on Eliquis, hyperlipidemia, sleep apnea who presents with fall and altered mental status per EMS.  Per EMS, patient fell 2 days ago.  He is on Eliquis.  Since that time son reported increased altered mental status and difficulty ambulating.  On my evaluation, patient is awake, alert, oriented.  He reports that he rolled out of bed striking his face.  Denies loss of consciousness.  He has no complaints for me today.  Denies chest pain, shortness breath, abdominal pain, nausea, vomiting.  He does continue to take Eliquis.  Past Medical History:  Diagnosis Date  . Abnormal stress test    a. 07/2012: abnormal stress echo showing hypokinesis of the mid-distal inferior wall suggestive of ischemia; + frequent PVCs, no ST changes or CP. Dr. [name] had a long discussion with the patient about further w/u versus medical therapy and the patient elected medical therapy..  . Arthritis   . Atrial fibrillation ([place])    a. Dx 01/2016.  [name] BPH (benign prostatic hyperplasia)   . History of blood transfusion   . Hyperlipidemia   . Hypothyroid   . Lymphoma ([place])    15 years ago  . Pneumonia   . Prostate cancer ([place])   . PVC's (premature ventricular contractions)    a. 07/2012 - seen on stress echo.  [name] RBBB    a. h/o intermittent rbbb.  . Restless legs syndrome   . Sinus bradycardia   . Sleep apnea    does not use a cpap    Patient Active Problem List   Diagnosis Date Noted  . Traumatic hematoma of right thigh 10/11/2019  . Periodic limb movement sleep disorder 03/26/2016  . Snoring 02/06/2016  . Daytime hypersomnolence 02/06/2016  . Periodic limb movement disorder (PLMD) 02/06/2016  . Hx  of recurrent pneumonia 02/06/2016  . HCAP (healthcare-associated pneumonia) 12/20/2013  . UTI (lower urinary tract infection) 12/20/2013  . Nonspecific abnormal electrocardiogram (ECG) (EKG) 12/10/2013  . Leukocytosis 12/09/2013  . Sinus tachycardia 12/09/2013  . Respiratory distress 12/09/2013  . Hypotension 12/09/2013  . Septic shock ([place]) 12/09/2013  . Unspecified hypothyroidism 12/09/2013  . Restless leg syndrome 12/09/2013  . Arthritis   . CAP (community acquired pneumonia) 12/08/2013  . Diabetes mellitus with renal manifestation ([place]) 12/14/2012  . Anemia 12/14/2012  . Nonspecific abnormal unspecified cardiovascular function study 10/18/2012  . Bradycardia 07/13/2012  . Hyperlipidemia 07/13/2012    Past Surgical History:  Procedure Laterality Date  . APPENDECTOMY    . Athroscopic knee surgery    . COLONOSCOPY    . dental implants    . GREEN LIGHT LASER TURP (TRANSURETHRAL RESECTION OF PROSTATE  08/29/2012   Procedure: GREEN LIGHT LASER TURP (TRANSURETHRAL RESECTION OF PROSTATE;  Surgeon: [name], MD;  Location: [facility];  Service: Urology;  Laterality: N/A;  . HEMATOMA EVACUATION Right 10/11/2019   Procedure: RIGHT LEG EVACUATION OF DEEP HEMATOMA;  Surgeon: [name], MD;  Location: [facility];  Service: Orthopedics;  Laterality: Right;  . MOHS SURGERY     x 6       Family History  Problem Relation Age of Onset  . CAD  Mother   . Stroke Brother         X 2    Social History   Tobacco Use  . Smoking status: Former Research scientist (life sciences)  . Smokeless tobacco: Never Used  Vaping Use  . Vaping Use: Never used  Substance Use Topics  . Alcohol use: Not Currently    Comment: Rarely  . Drug use: Never    Home Medications Prior to Admission medications   Medication Sig Start Date End Date Taking? Authorizing Provider  apixaban (ELIQUIS) 5 MG TABS tablet Take 5 mg by mouth 2 (two) times daily.    [provider]  diltiazem (CARDIZEM CD) 120 MG 24 hr capsule Take  1 capsule (120 mg total) by mouth daily. 02/21/19   Lelon Perla, MD  ergocalciferol (VITAMIN D2) 50000 UNITS capsule Take 50,000 Units by mouth once a week.    [provider]  levothyroxine (SYNTHROID) 25 MCG tablet Take 25 mcg by mouth daily.     [provider]  ropinirole (REQUIP) 5 MG tablet Take 5 mg by mouth at bedtime. 2100    [provider]    Allergies    Patient has no known allergies.  Review of Systems   Review of Systems  Constitutional: Negative for fever.  Respiratory: Negative for shortness of breath.   Cardiovascular: Negative for chest pain.  Gastrointestinal: Negative for abdominal pain, nausea and vomiting.  Genitourinary: Negative for dysuria.  Skin: Positive for wound.  Neurological: Negative for dizziness and light-headedness.  All other systems reviewed and are negative.   Physical Exam Updated Vital Signs BP (!) 124/49   Pulse 61   Temp 97.9 F (36.6 C)   Resp (!) 25   SpO2 97%   Physical Exam Vitals and nursing note reviewed.  Constitutional:      Appearance: He is well-developed.     Comments: Elderly, ABCs intact, nontoxic-appearing  HENT:     Head: Normocephalic.     Comments: Ecchymosis about the right eye, midface stable    Nose: Nose normal.     Mouth/Throat:     Mouth: Mucous membranes are moist.  Eyes:     Pupils: Pupils are equal, round, and reactive to light.  Cardiovascular:     Rate and Rhythm: Normal rate. Rhythm irregular.     Heart sounds: Normal heart sounds. No murmur heard.   Pulmonary:     Effort: Pulmonary effort is normal. No respiratory distress.     Breath sounds: Normal breath sounds. No wheezing.  Abdominal:     General: Bowel sounds are normal.     Palpations: Abdomen is soft.     Tenderness: There is no abdominal tenderness. There is no rebound.  Musculoskeletal:        General: No tenderness or deformity.     Cervical back: Neck supple.     Right lower leg: No edema.      Left lower leg: No edema.  Lymphadenopathy:     Cervical: No cervical adenopathy.  Skin:    General: Skin is warm and dry.  Neurological:     Mental Status: He is alert and oriented to person, place, and time.  Psychiatric:        Mood and Affect: Mood normal.     ED Results / Procedures / Treatments   Labs (all labs ordered are listed, but only abnormal results are displayed) Labs Reviewed  CBC WITH DIFFERENTIAL/PLATELET - Abnormal; Notable for the following components:  Result Value   RBC 3.74 (*)    Hemoglobin 12.1 (*)    HCT 38.3 (*)    MCV 102.4 (*)    All other components within normal limits  BASIC METABOLIC PANEL - Abnormal; Notable for the following components:   Glucose, Bld 189 (*)    BUN 24 (*)    GFR, Estimated 59 (*)    All other components within normal limits  CBG MONITORING, ED - Abnormal; Notable for the following components:   Glucose-Capillary 179 (*)    All other components within normal limits  URINALYSIS, ROUTINE W REFLEX MICROSCOPIC    EKG EKG Interpretation  Date/Time:  Saturday May 11 2020 13:09:16 EDT Ventricular Rate:  57 PR Interval:    QRS Duration: 167 QT Interval:  491 QTC Calculation: 479 R Axis:   -76 Text Interpretation: Atrial fibrillation RBBB and LAFB Left ventricular hypertrophy Confirmed by Thayer Jew [phone]) on 05/11/2020 1:58:44 PM   Radiology CT Head Wo Contrast  Result Date: 05/11/2020 CLINICAL DATA:  Pain following trauma EXAM: CT HEAD WITHOUT CONTRAST TECHNIQUE: Contiguous axial images were obtained from the base of the skull through the vertex without intravenous contrast. COMPARISON:  None. FINDINGS: Brain: There is mild diffuse atrophy. There is no intracranial mass, hemorrhage, extra-axial fluid collection, or midline shift. There is slight small vessel disease in the centra semiovale bilaterally. No acute infarct is evident. Vascular: No evident hyperdense vessel. There is calcification in each carotid  siphon region. Skull: Bony calvarium appears intact. Sinuses/Orbits: There is a retention cyst in the anterior left sphenoid sinus. There is mucosal thickening in several ethmoid air cells. There is rightward deviation of the nasal septum. The orbits appear symmetric bilaterally. Other: Mastoid air cells are clear. IMPRESSION: Atrophy with slight periventricular small vessel disease. No mass or hemorrhage. No acute infarct. There are foci of arterial vascular calcification. There are foci of paranasal sinus disease. There is deviation of the nasal septum. Electronically Signed   By: Lowella Grip III M.D.   On: 05/11/2020 15:06   CT Cervical Spine Wo Contrast  Result Date: 05/11/2020 CLINICAL DATA:  Pain following fall.  Unsteady gait EXAM: CT CERVICAL SPINE WITHOUT CONTRAST TECHNIQUE: Multidetector CT imaging of the cervical spine was performed without intravenous contrast. Multiplanar CT image reconstructions were also generated. COMPARISON:  None. FINDINGS: Alignment: There is no appreciable spondylolisthesis. Skull base and vertebrae: The skull base and craniocervical junction regions appear normal. No evident acute fracture. No blastic or lytic bone lesions. There are cystic areas in the odontoid region. Soft tissues and spinal canal: Prevertebral soft tissues and predental space regions are normal. There is no evident cord or canal hematoma. No paraspinous lesions are appreciable. Disc levels: There is moderately severe disc space narrowing at C3-4, C5-6, C6-7, and C7-T1. There is facet hypertrophy to varying degrees at all levels. There is impression on the exiting nerve root with relative nerve root effacement at C4-5 on the left and at C5-6 on the right. No frank disc extrusion or stenosis evident. Upper chest: Visualized upper lung regions are clear. Other: There are foci of subclavian and carotid artery calcification bilaterally. Retention cyst noted in anterior left sphenoid sinus. IMPRESSION:  No appreciable fracture or spondylolisthesis. Extensive multifocal arthropathy. Impression on exiting nerve roots due to bony hypertrophy at C4-5 on the left at C5-6 on the right. No frank disc extrusion or stenosis. Foci of subclavian and carotid artery calcification bilaterally noted. Electronically Signed   By: Lowella Grip III  M.D.   On: 05/11/2020 15:15    Procedures Procedures (including critical care time)  Medications Ordered in ED Medications - No data to display  ED Course  I have reviewed the triage vital signs and the nursing notes.  Pertinent labs & imaging results that were available during my care of the patient were reviewed by me and considered in my medical decision making (see chart for details).  Clinical Course as of May 12 1527  Sat May 11, 2020  1358 Spoke with patient's son.  Reports he has had a generalized decline and refuses to see a primary care provider.  He did fall out of bed 2 days ago and since that time has had difficulty with balance and ambulation on his own.  He does continue to take Eliquis.   [CH]    Clinical Course User Index [CH] Chaniece Barbato, Barbette Hair, MD   MDM Rules/Calculators/A&P                          Patient presents following a fall and general decline.  He did fall 2 days ago and rolled out of bed.  Son also reports significant sleep apnea and hesitance to follow-up for ongoing health maintenance.  On exam he is nontoxic and vital signs are largely reassuring.  He is alert and oriented for me.  He denies any pain.  He does have an obvious ecchymosis to the right eye.  Will obtain basic lab works, urinalysis, CT head neck.  Lab reviewed.  No significant metabolic derangements.  Slightly elevated glucose at 189 without evidence of anion gap.  Hemoglobin is stable at 12.1.  CT scans reviewed by myself.  No evidence of intracranial injury.  He has some chronic cervical spine changes.  He is neurologically intact.  Awaiting urinalysis.   Patient will need to ambulate prior to discharge.  Son does report that he is being set up with the [facility] in [place].  He lives with his son. Final Clinical Impression(s) / ED Diagnoses Final diagnoses:  None    Rx / DC Orders ED Discharge Orders    None       Merryl Hacker, MD 05/11/20 1530

## 2020-05-11 NOTE — ED Triage Notes (Signed)
Pt brought to ED via EMS from home. Pt's son called reporting pt fell at home x 2 days ago and has become lethargic with an increasingly unsteady gait over the past couple of days. Pt is on eliquis, did not seek medical care following fall. Currently A&O x4, denies pain, NAD, VSS.  EMS v/s: 114/59 95% on room air 56 HR afib on EKG 181 CBG

## 2020-05-11 NOTE — H&P (Signed)
CARDIOLOGY ADMISSION NOTE  Patient ID: Travis Bradford MRN: [id number] DOB/AGE: 84-Jul-1927 84 y.o.  Admit date: 05/11/2020 Primary Physician   Primary Cardiologist   Dr [name] Chief Complaint   Weak, tired, falls  ASSESSMENT AND PLAN:   Atrial fibrillation with slow VR (HR as low as 20-30s) - has underlying RBBB and LAFB Dizziness, near syncope sec to extreme low HR (25-30s) 2-3s pause (Afib) Falls likely sec to above   - CT head/neck negative Severe OSA not on CPAP Restless leg syndrome on ropinirole will cut down the dose to half (2.5mg ) nightly.   Plan:  - admit to cardiology stop Diltiazem 120mg  er tablet. Given multiple low HR with pause, spoke with the ER physician and gave 1gm calcium gluconate to counter act diltiazem effects. As the drug wears off, his HR should come up. - close telemetry monitoring and pads are attached should he need urgent transcutaneous pacing. IV atropine stand by. In case he becomes unstable then he will need a TVP - EP consult in am or Monday. Likely his HR should improve, but if he behaves tachy-brady or has a long pause then he might benefit from dual chamber PPM - continue home meds including eliquis 5mg  BID -avoid av nodal blocking agents. Interestingly ropinirole can also lead to bradycardia he is taking 2.5 mg twice daily will just cut down to 2.5 mg nightly dose  HPI:  Mr. Travis Bradford is a 86 year old male with a past medical history of longstanding persistent atrial fibrillation started July 2017 intolerant of beta-blocker currently on diltiazem normal echocardiogram in the past showed low EF in 2014 medically treated), hypertension, hyperlipidemia, severe obstructive sleep apnea not wearing CPAP, non-Hodgkin's lymphoma 20 years ago status post chemotherapy and radiation, carotid disease presents to hospital with complaints of extreme dizziness fatigue and tired since last couple of days  Most of the history obtained from son who is at  bedside and the father lives with him. Notes in the last few weeks the patient has gotten progressively weak and tired unable to do his daily activities.  Son monitors his heart rate regularly and always been in the 50s however in the last few days has been sitting down to 40s. Since 2 days his heart rate has been in 30s at home and given continued feeling of dizziness and fatigue, he had a fall 2 days ago  with hitting his head and has a bruise on his right eye, was brought to the hospital He denies any syncope but had near syncope today, no orthopnea, PND, lower extremity edema.  No easy bruising or hematuria or blood in it stools denies any chest pain or shortness of breath  ER work-up: Fibrillation with slow ventricular rate with pauses roughly 2 seconds on telemetry there are multiple strips with heart rate as low as 24-26 with pauses up to 3 seconds. Blood pressure has been stable Discussing with the ER physician we gave him 1 g calcium gluconate to counteract effects of diltiazem     EKG: Atrial fibrillation with slow ventricular rate with underlying right bundle branch block and left anterior fascicular block..  I reviewed telemetry I do not suspect he is in complete heart block because the RR intervals are irregular and his heart rate fluctuates from 20s to 30s going up to 50s, irregular rhythm  Past Medical History:  Diagnosis Date  . Abnormal stress test    a. 07/2012: abnormal stress echo showing hypokinesis of the mid-distal inferior wall suggestive of  ischemia; + frequent PVCs, no ST changes or CP. Dr. [name] had a long discussion with the patient about further w/u versus medical therapy and the patient elected medical therapy..  . Arthritis   . Atrial fibrillation ([place])    a. Dx 01/2016.  [name] Atrial fibrillation with slow ventricular response ([place]) 05/11/2020  . BPH (benign prostatic hyperplasia)   . Dizziness 05/11/2020  . History of blood transfusion   . Hyperlipidemia   .  Hypothyroid   . Lymphoma ([place])    15 years ago  . Pneumonia   . Prostate cancer ([place])   . PVC's (premature ventricular contractions)    a. 07/2012 - seen on stress echo.  [name] RBBB    a. h/o intermittent rbbb.  . Restless legs syndrome   . Sinus bradycardia   . Sleep apnea    does not use a cpap    Past Surgical History:  Procedure Laterality Date  . APPENDECTOMY    . Athroscopic knee surgery    . COLONOSCOPY    . dental implants    . GREEN LIGHT LASER TURP (TRANSURETHRAL RESECTION OF PROSTATE  08/29/2012   Procedure: GREEN LIGHT LASER TURP (TRANSURETHRAL RESECTION OF PROSTATE;  Surgeon: [name], MD;  Location: [facility];  Service: Urology;  Laterality: N/A;  . HEMATOMA EVACUATION Right 10/11/2019   Procedure: RIGHT LEG EVACUATION OF DEEP HEMATOMA;  Surgeon: [name], MD;  Location: [facility];  Service: Orthopedics;  Laterality: Right;  . MOHS SURGERY     x 6    Allergies  Allergen Reactions  . Tape Other (See Comments)    THE PATIENT'S SKIN IS VERY THIN- TEARS AND BRUISES EASILY!!!!   No current facility-administered medications on file prior to encounter.   Current Outpatient Medications on File Prior to Encounter  Medication Sig Dispense Refill  . apixaban (ELIQUIS) 5 MG TABS tablet Take 5 mg by mouth 2 (two) times daily.    . Cholecalciferol (D3-50) 1.25 MG (50000 UT) capsule Take 50,000 Units by mouth every Monday.    . diltiazem (CARDIZEM CD) 120 MG 24 hr capsule Take 1 capsule (120 mg total) by mouth daily. 90 capsule 3  . levothyroxine (SYNTHROID) 25 MCG tablet Take 25 mcg by mouth daily.     [name] rOPINIRole (REQUIP) 2 MG tablet Take 2 mg by mouth in the morning and at bedtime.     Social History   Socioeconomic History  . Marital status: Widowed    Spouse name: Not on file  . Number of children: 2  . Years of education: Not on file  . Highest education level: Not on file  Occupational History  . Not on file  Tobacco Use  . Smoking status: Former Research scientist (life sciences)   . Smokeless tobacco: Never Used  Vaping Use  . Vaping Use: Never used  Substance and Sexual Activity  . Alcohol use: Not Currently    Comment: Rarely  . Drug use: Never  . Sexual activity: Not on file  Other Topics Concern  . Not on file  Social History Narrative  . Not on file   Social Determinants of Health   Financial Resource Strain:   . Difficulty of Paying Living Expenses: Not on file  Food Insecurity:   . Worried About Charity fundraiser in the Last Year: Not on file  . Ran Out of Food in the Last Year: Not on file  Transportation Needs:   . Lack of Transportation (Medical): Not on file  .  Lack of Transportation (Non-Medical): Not on file  Physical Activity:   . Days of Exercise per Week: Not on file  . Minutes of Exercise per Session: Not on file  Stress:   . Feeling of Stress : Not on file  Social Connections:   . Frequency of Communication with Friends and Family: Not on file  . Frequency of Social Gatherings with Friends and Family: Not on file  . Attends Religious Services: Not on file  . Active Member of Clubs or Organizations: Not on file  . Attends Archivist Meetings: Not on file  . Marital Status: Not on file  Intimate Partner Violence:   . Fear of Current or Ex-Partner: Not on file  . Emotionally Abused: Not on file  . Physically Abused: Not on file  . Sexually Abused: Not on file    Family History  Problem Relation Age of Onset  . CAD Mother   . Stroke Brother         X 2     Review of Systems: [y] = yes, [ ]  = no       General: Weight gain [] ; Weight loss [ ] ; Anorexia [ ] ; Fatigue [ ] ; Fever [ ] ; Chills [ ] ; Weakness [ ]     Cardiac: Chest pain/pressure [ ] ; Resting SOB [ ] ; Exertional SOB [ ] ; Orthopnea [ ] ; Pedal Edema [ ] ; Palpitations [ ] ; Syncope [ ] ; Presyncope [ ] ; Paroxysmal nocturnal dyspnea[ ]   , dizziness, fatigue  Pulmonary: Cough [ ] ; Wheezing[ ] ; Hemoptysis[ ] ; Sputum [ ] ; Snoring [ ]     GI: Vomiting[ ] ;  Dysphagia[ ] ; Melena[ ] ; Hematochezia [ ] ; Heartburn[ ] ; Abdominal pain [ ] ; Constipation [ ] ; Diarrhea [ ] ; BRBPR [ ]     GU: Hematuria[ ] ; Dysuria [ ] ; Nocturia[ ]   Vascular: Pain in legs with walking [ ] ; Pain in feet with lying flat [ ] ; Non-healing sores [ ] ; Stroke [ ] ; TIA [ ] ; Slurred speech [ ] ;    Neuro: Headaches[ ] ; Vertigo[ ] ; Seizures[ ] ; Paresthesias[ ] ;Blurred vision [ ] ; Diplopia [ ] ; Vision changes [ ]   weak all over, falls  Ortho/Skin: Arthritis [ ] ; Joint pain [ ] ; Muscle pain [ ] ; Joint swelling [ ] ; Back Pain [ ] ; Rash [ ]     Psych: Depression[ ] ; Anxiety[ ]     Heme: Bleeding problems [ ] ; Clotting disorders [ ] ; Anemia [ ]     Endocrine: Diabetes [ ] ; Thyroid dysfunction[ ]   Physical Exam: Blood pressure 116/67, pulse (!) 46, temperature 97.8 F (36.6 C), temperature source Oral, resp. rate 14, SpO2 98 %.   GENERAL: Patient is afebrile, Vital signs reviewed, Well appearing, Patient appears comfortable, Alert and lucid. EYES: Normal inspection. HEENT:  normocephalic, atraumatic , normal ENT inspection. ORAL:  Moist NECK:  supple , normal inspection. CARD:  Irregular rhythm with slow heart rate, heart sounds normal. RESP:  no respiratory distress, breath sounds normal. ABD: soft, tender to palpation, BS present, soft, no organomegaly or masses . BACK: non-tender. No CVA tenderness. MUSC:  normal ROM, non-tender , no pedal edema . SKIN: color normal, no rash, warm, dry . NEURO: awake & alert, lucid, no motor/sensory deficit. Gait stable. PSYCH: mood/affect normal.   Labs: Lab Results  Component Value Date   BUN 24 (H) 05/11/2020   Lab Results  Component Value Date   CREATININE 1.09 05/11/2020   Lab Results  Component Value Date   NA 135 05/11/2020   K 3.9  05/11/2020   CL 103 05/11/2020   CO2 24 05/11/2020   Lab Results  Component Value Date   TROPONINI <0.30 12/20/2013   Lab Results  Component Value Date   WBC 7.9 05/11/2020   HGB 12.1 (L)  05/11/2020   HCT 38.3 (L) 05/11/2020   MCV 102.4 (H) 05/11/2020   PLT 260 05/11/2020   No results found for: CHOL, HDL, LDLCALC, LDLDIRECT, TRIG, CHOLHDL Lab Results  Component Value Date   ALT 14 02/06/2016   AST 19 02/06/2016   ALKPHOS 64 02/06/2016   BILITOT 0.5 02/06/2016      Radiology:   CT head and cervical spine negative   IMPRESSION: Atrophy with slight periventricular small vessel disease. No mass or hemorrhage. No acute infarct.  There are foci of arterial vascular calcification. There are foci of paranasal sinus disease. There is deviation of the nasal septum  IMPRESSION: No appreciable fracture or spondylolisthesis. Extensive multifocal arthropathy. Impression on exiting nerve roots due to bony hypertrophy at C4-5 on the left at C5-6 on the right. No frank disc extrusion or stenosis. Foci of subclavian and carotid artery calcification bilaterally noted.   Signed: Renae Fickle 05/11/2020, 7:36 PM

## 2020-05-11 NOTE — ED Notes (Signed)
Cardiology MD at bedside.

## 2020-05-11 NOTE — ED Notes (Signed)
Pt ambulatory with assistance x 2. Pt very weak, leaning on both people completely by end of walk. Pt reports weakness, denies dizziness. Pt repositioned in bed, warm blanket given.

## 2020-05-12 DIAGNOSIS — I4891 Unspecified atrial fibrillation: Secondary | ICD-10-CM | POA: Diagnosis not present

## 2020-05-12 LAB — CBC
HCT: 40.1 % (ref 39.0–52.0)
Hemoglobin: 13 g/dL (ref 13.0–17.0)
MCH: 32.9 pg (ref 26.0–34.0)
MCHC: 32.4 g/dL (ref 30.0–36.0)
MCV: 101.5 fL — ABNORMAL HIGH (ref 80.0–100.0)
Platelets: 297 10*3/uL (ref 150–400)
RBC: 3.95 MIL/uL — ABNORMAL LOW (ref 4.22–5.81)
RDW: 13 % (ref 11.5–15.5)
WBC: 10.5 10*3/uL (ref 4.0–10.5)
nRBC: 0 % (ref 0.0–0.2)

## 2020-05-12 LAB — BASIC METABOLIC PANEL
Anion gap: 9 (ref 5–15)
BUN: 30 mg/dL — ABNORMAL HIGH (ref 8–23)
CO2: 25 mmol/L (ref 22–32)
Calcium: 9.7 mg/dL (ref 8.9–10.3)
Chloride: 104 mmol/L (ref 98–111)
Creatinine, Ser: 1.29 mg/dL — ABNORMAL HIGH (ref 0.61–1.24)
GFR, Estimated: 48 mL/min — ABNORMAL LOW (ref >60)
Glucose, Bld: 150 mg/dL — ABNORMAL HIGH (ref 70–99)
Potassium: 4.6 mmol/L (ref 3.5–5.1)
Sodium: 138 mmol/L (ref 135–145)

## 2020-05-12 NOTE — Progress Notes (Signed)
Pt removed IV access. Will notify day RN. IV team consult in place.

## 2020-05-12 NOTE — Progress Notes (Signed)
Progress Note  Patient Name: Travis Bradford Date of Encounter: 05/12/2020  Primary Cardiologist: No primary care provider on file.   Subjective   Patient upset, appears confused.  Alert, communicates easily.  Inpatient Medications    Scheduled Meds: . apixaban  5 mg Oral BID  . levothyroxine  25 mcg Oral Daily  . ropinirole  2.5 mg Oral QHS   Continuous Infusions:  PRN Meds: acetaminophen **OR** acetaminophen   Vital Signs    Vitals:   05/11/20 2119 05/12/20 0050 05/12/20 0649 05/12/20 0842  BP:  122/60 (!) 149/67 139/72  Pulse:  (!) 59 (!) 50 (!) 54  Resp:  18  18  Temp:  98.2 F (36.8 C) 98.1 F (36.7 C) (!) 97.4 F (36.3 C)  TempSrc:  Oral Oral Oral  SpO2:  91% 99% 98%  Weight: 96.8 kg  96 kg   Height: 5\' 7"  (1.702 m)  5\' 7"  (1.702 m)     Intake/Output Summary (Last 24 hours) at 05/12/2020 0847 Last data filed at 05/11/2020 2200 Gross per 24 hour  Intake 240 ml  Output 100 ml  Net 140 ml   Filed Weights   05/11/20 2119 05/12/20 0649  Weight: 96.8 kg 96 kg    Telemetry    A. fib slow ventricular response rates 50s to 60s- Personally Reviewed  ECG    A. fib SVR rate 53- Personally Reviewed  Physical Exam   GEN:  Agitated Neck: No JVD Cardiac:  Irregular, bradycardic Respiratory: Clear to auscultation bilaterally.  GI: Soft, nontender, non-distended  MS: No edema; No deformity. Neuro:  Nonfocal  Psych: Agitated, confused.  Labs    Chemistry Recent Labs  Lab 05/11/20 1351  NA 135  K 3.9  CL 103  CO2 24  GLUCOSE 189*  BUN 24*  CREATININE 1.09  CALCIUM 9.2  GFRNONAA 59*  ANIONGAP 8     Hematology Recent Labs  Lab 05/11/20 1351  WBC 7.9  RBC 3.74*  HGB 12.1*  HCT 38.3*  MCV 102.4*  MCH 32.4  MCHC 31.6  RDW 13.1  PLT 260    Cardiac EnzymesNo results for input(s): TROPONINI in the last 168 hours. No results for input(s): TROPIPOC in the last 168 hours.   BNPNo results for input(s): BNP, PROBNP in the last 168  hours.   DDimer No results for input(s): DDIMER in the last 168 hours.   Radiology    CT Head Wo Contrast  Result Date: 05/11/2020 CLINICAL DATA:  Pain following trauma EXAM: CT HEAD WITHOUT CONTRAST TECHNIQUE: Contiguous axial images were obtained from the base of the skull through the vertex without intravenous contrast. COMPARISON:  None. FINDINGS: Brain: There is mild diffuse atrophy. There is no intracranial mass, hemorrhage, extra-axial fluid collection, or midline shift. There is slight small vessel disease in the centra semiovale bilaterally. No acute infarct is evident. Vascular: No evident hyperdense vessel. There is calcification in each carotid siphon region. Skull: Bony calvarium appears intact. Sinuses/Orbits: There is a retention cyst in the anterior left sphenoid sinus. There is mucosal thickening in several ethmoid air cells. There is rightward deviation of the nasal septum. The orbits appear symmetric bilaterally. Other: Mastoid air cells are clear. IMPRESSION: Atrophy with slight periventricular small vessel disease. No mass or hemorrhage. No acute infarct. There are foci of arterial vascular calcification. There are foci of paranasal sinus disease. There is deviation of the nasal septum. Electronically Signed   By: Lowella Grip III M.D.   On: 05/11/2020  15:06   CT Cervical Spine Wo Contrast  Result Date: 05/11/2020 CLINICAL DATA:  Pain following fall.  Unsteady gait EXAM: CT CERVICAL SPINE WITHOUT CONTRAST TECHNIQUE: Multidetector CT imaging of the cervical spine was performed without intravenous contrast. Multiplanar CT image reconstructions were also generated. COMPARISON:  None. FINDINGS: Alignment: There is no appreciable spondylolisthesis. Skull base and vertebrae: The skull base and craniocervical junction regions appear normal. No evident acute fracture. No blastic or lytic bone lesions. There are cystic areas in the odontoid region. Soft tissues and spinal canal:  Prevertebral soft tissues and predental space regions are normal. There is no evident cord or canal hematoma. No paraspinous lesions are appreciable. Disc levels: There is moderately severe disc space narrowing at C3-4, C5-6, C6-7, and C7-T1. There is facet hypertrophy to varying degrees at all levels. There is impression on the exiting nerve root with relative nerve root effacement at C4-5 on the left and at C5-6 on the right. No frank disc extrusion or stenosis evident. Upper chest: Visualized upper lung regions are clear. Other: There are foci of subclavian and carotid artery calcification bilaterally. Retention cyst noted in anterior left sphenoid sinus. IMPRESSION: No appreciable fracture or spondylolisthesis. Extensive multifocal arthropathy. Impression on exiting nerve roots due to bony hypertrophy at C4-5 on the left at C5-6 on the right. No frank disc extrusion or stenosis. Foci of subclavian and carotid artery calcification bilaterally noted. Electronically Signed   By: Lowella Grip III M.D.   On: 05/11/2020 15:15    Cardiac Studies   None available  Patient Profile     86 y.o. male with persistent atrial fibrillation admitted for symptomatic A. fib with slow ventricular response.  Assessment & Plan   Principal Problem:   Atrial fibrillation with slow ventricular response (HCC) Active Problems:   Dizziness   Fall   Permanent atrial fibrillation, now with symptomatic slow ventricular response. -Patient's family was not available during rounds today. Spoke to son on phone for 10 minutes. Agreed to observe patient overnight for continued monitoring on telemetry.  - hold AVN blocking agents. Rates appear to have improved slightly.  - will consult EP in AM for discussion of appropriateness for PPM. - hold diltiazem -continue eliquis. -untreated OSA may be contributing, son describes frequent overnight apnea.   HTN - BP stable, no meds at this time.  Hypothyroid - continue  synthroid. TSH with am labs.     For questions or updates, please contact [place] Please consult [email or web address] for contact info under        Signed, Elouise Munroe, MD  05/12/2020, 8:47 AM

## 2020-05-12 NOTE — Progress Notes (Signed)
Pt refuses to use the urinal, keeps on getting OOB to use the bathroom, pt reminded to call for assistance.

## 2020-05-13 DIAGNOSIS — I4891 Unspecified atrial fibrillation: Secondary | ICD-10-CM | POA: Diagnosis not present

## 2020-05-13 LAB — CBC
HCT: 38.2 % — ABNORMAL LOW (ref 39.0–52.0)
Hemoglobin: 12.8 g/dL — ABNORMAL LOW (ref 13.0–17.0)
MCH: 33.3 pg (ref 26.0–34.0)
MCHC: 33.5 g/dL (ref 30.0–36.0)
MCV: 99.5 fL (ref 80.0–100.0)
Platelets: 265 10*3/uL (ref 150–400)
RBC: 3.84 MIL/uL — ABNORMAL LOW (ref 4.22–5.81)
RDW: 12.7 % (ref 11.5–15.5)
WBC: 10.1 10*3/uL (ref 4.0–10.5)
nRBC: 0 % (ref 0.0–0.2)

## 2020-05-13 LAB — BASIC METABOLIC PANEL
Anion gap: 8 (ref 5–15)
BUN: 24 mg/dL — ABNORMAL HIGH (ref 8–23)
CO2: 25 mmol/L (ref 22–32)
Calcium: 9.3 mg/dL (ref 8.9–10.3)
Chloride: 103 mmol/L (ref 98–111)
Creatinine, Ser: 1.23 mg/dL (ref 0.61–1.24)
GFR, Estimated: 51 mL/min — ABNORMAL LOW (ref >60)
Glucose, Bld: 206 mg/dL — ABNORMAL HIGH (ref 70–99)
Potassium: 4 mmol/L (ref 3.5–5.1)
Sodium: 136 mmol/L (ref 135–145)

## 2020-05-13 LAB — TSH: TSH: 3.677 u[IU]/mL (ref 0.350–4.500)

## 2020-05-13 NOTE — Evaluation (Addendum)
Physical Therapy Evaluation Patient Details Name: Travis Bradford MRN: [id number] DOB: 1926/01/10 Today's Date: 05/13/2020   History of Present Illness  Patient is a 86 y/o male who presents with lethargy, dizziness, fatigue, and unsteady gait, s/p fall. Admitted with A-fib with slow ventricular rate (as low as 20-30s). PMH includes A-fib, HLD, prostate ca, RBBB, PVCs.  Clinical Impression  Patient presents with generalized weakness, impaired balance, impaired cognition and impaired mobility s/p above. Pt lives with son in 2 story home and has a bedroom on the second level. Reports multiple falls at home. Per pt, does own ADLs but son does IADLs. Uses walking stick for mobility. Today, pt requires Min A for standing and gait training with HHA. Would benefit from using RW for support. HR ranged from 59-87 bpm with activity, no dizziness reported. Pt with poor memory and impaired safety awareness. Recommend hands on assist for mobility at least initially and use of RW to decrease fall risk. Concerned about bedroom being on second level. Will need to practice stairs next session. Will follow acutely to maximize independence and mobility prior to return home.    Follow Up Recommendations Home health PT;Supervision for mobility/OOB;Supervision/Assistance - 24 hour    Equipment Recommendations  Rolling walker with 5" wheels;3in1 (PT)    Recommendations for Other Services       Precautions / Restrictions Precautions Precautions: Fall;Other (comment) Precaution Comments: watch HR Restrictions Weight Bearing Restrictions: No      Mobility  Bed Mobility               General bed mobility comments: Up in chair upon PT arrival.  Transfers Overall transfer level: Needs assistance Equipment used: None Transfers: Sit to/from Stand Sit to Stand: Min assist         General transfer comment: assist to power to standing from low chair.  Ambulation/Gait Ambulation/Gait assistance: Min  assist Gait Distance (Feet): 75 Feet Assistive device: 1 person hand held assist Gait Pattern/deviations: Step-through pattern;Decreased stride length;Shuffle;Trunk flexed Gait velocity: decreased Gait velocity interpretation: <1.8 ft/sec, indicate of risk for recurrent falls General Gait Details: Slow, unsteady gait with HHA (Min A) for balance. 2/4 DOE. A few standing rest breaks. Would benefit from using RW. SHuffling bil feet at times esp when fatigued.  Stairs            Wheelchair Mobility    Modified Rankin (Stroke Patients Only)       Balance Overall balance assessment: Needs assistance;History of Falls Sitting-balance support: Feet supported;No upper extremity supported Sitting balance-Leahy Scale: Fair     Standing balance support: During functional activity Standing balance-Leahy Scale: Poor Standing balance comment: Able to stand statically with close Min guard, needs UE support for dynamic standing.                             Pertinent Vitals/Pain Pain Assessment: No/denies pain    Home Living Family/patient expects to be discharged to:: Private residence Living Arrangements:  (son) Available Help at Discharge: Family;Available PRN/intermittently Type of Home: House Home Access: Stairs to enter   Entrance Stairs-Number of Steps: 2 Home Layout: Two level;Able to live on main level with bedroom/bathroom Home Equipment: Other (comment);Walker - 4 wheels (walking stivk)      Prior Function Level of Independence: Needs assistance   Gait / Transfers Assistance Needed: Uses walking stick for ambulation. Reports lots of falls  ADL's / Homemaking Assistance Needed: son does the cleaning and  cooking, assists with dressing and bathing at times        Hand Dominance        Extremity/Trunk Assessment   Upper Extremity Assessment Upper Extremity Assessment: Defer to OT evaluation    Lower Extremity Assessment Lower Extremity Assessment:  Generalized weakness    Cervical / Trunk Assessment Cervical / Trunk Assessment: Kyphotic  Communication   Communication: HOH  Cognition Arousal/Alertness: Awake/alert Behavior During Therapy: WFL for tasks assessed/performed Overall Cognitive Status: No family/caregiver present to determine baseline cognitive functioning Area of Impairment: Memory;Safety/judgement;Awareness                     Memory: Decreased short-term memory   Safety/Judgement: Decreased awareness of safety;Decreased awareness of deficits Awareness: Intellectual   General Comments: Repeating things during session; "I have a bad memory and poor balance." Needs repetition due to [facility]. Tangential. POor awareness of safety.      General Comments General comments (skin integrity, edema, etc.): HR ranging from 59-87 bpm during activity.    Exercises     Assessment/Plan    PT Assessment Patient needs continued PT services  PT Problem List Decreased strength;Decreased mobility;Decreased safety awareness;Decreased cognition;Decreased activity tolerance;Cardiopulmonary status limiting activity;Decreased knowledge of use of DME;Decreased balance       PT Treatment Interventions Therapeutic activities;Gait training;DME instruction;Therapeutic exercise;Patient/family education;Balance training;Stair training;Functional mobility training    PT Goals (Current goals can be found in the Care Plan section)  Acute Rehab PT Goals Patient Stated Goal: "to get the hell out of here" PT Goal Formulation: With patient Time For Goal Achievement: 05/27/20 Potential to Achieve Goals: Good    Frequency Min 3X/week   Barriers to discharge Inaccessible home environment bedroom on second floor    Co-evaluation               AM-PAC PT "6 Clicks" Mobility  Outcome Measure Help needed turning from your back to your side while in a flat bed without using bedrails?: None Help needed moving from lying on your back  to sitting on the side of a flat bed without using bedrails?: A Little Help needed moving to and from a bed to a chair (including a wheelchair)?: A Little Help needed standing up from a chair using your arms (e.g., wheelchair or bedside chair)?: A Little Help needed to walk in hospital room?: A Little Help needed climbing 3-5 steps with a railing? : A Lot 6 Click Score: 18    End of Session Equipment Utilized During Treatment: Gait belt Activity Tolerance: Patient tolerated treatment well;Patient limited by fatigue Patient left: in chair;with call bell/phone within reach;with chair alarm set Nurse Communication: Mobility status PT Visit Diagnosis: Muscle weakness (generalized) (M62.81);Unsteadiness on feet (R26.81);Difficulty in walking, not elsewhere classified (R26.2)    Time: 1610-9604 PT Time Calculation (min) (ACUTE ONLY): 17 min   Charges:   PT Evaluation $PT Eval Moderate Complexity: 1 Mod          Marisa Severin, PT, DPT Acute Rehabilitation Services Pager [phone] Office [place] 05/13/2020, 1:41 PM

## 2020-05-13 NOTE — Progress Notes (Addendum)
Progress Note  Patient Name: Travis Bradford Date of Encounter: 05/13/2020  Primary Cardiologist: [name], MD  Subjective   HR improved to 50s-60s off diltiazem. Patient still reports he feels "shitty." He says it's hard to determine what his normal is. Long conversation with son [name] at bedside who reports on a general decline the last few months - has been difficult to discern whether this is related to severe untreated OSA (pt refuses to consider treatment), depression, memory issues, fatigue or progressive sedentary lifestyle. Intermittent confusion noted this admission.  Inpatient Medications    Scheduled Meds:  apixaban  5 mg Oral BID   levothyroxine  25 mcg Oral Daily   ropinirole  2.5 mg Oral QHS   Continuous Infusions:  PRN Meds: acetaminophen **OR** acetaminophen   Vital Signs    Vitals:   05/12/20 1702 05/12/20 1942 05/13/20 0645 05/13/20 0754  BP: 130/69 (!) 125/51 (!) 144/69 117/63  Pulse: 63 60 (!) 56 (!) 59  Resp: 18 18 18 17   Temp: 98.1 F (36.7 C) 98.8 F (37.1 C) (!) 97.5 F (36.4 C) 98.1 F (36.7 C)  TempSrc: Oral Oral Oral Oral  SpO2: 96% 98% 99% 96%  Weight:      Height:        Intake/Output Summary (Last 24 hours) at 05/13/2020 0927 Last data filed at 05/13/2020 0700 Gross per 24 hour  Intake 1200 ml  Output 400 ml  Net 800 ml   Last 3 Weights 05/12/2020 05/11/2020 12/18/2019  Weight (lbs) 211 lb 9.6 oz 213 lb 6.5 oz 217 lb  Weight (kg) 95.981 kg 96.8 kg 98.431 kg     Telemetry    Atrial fib HR 50s-60s - Personally Reviewed  Physical Exam   GEN: No acute distress.  HEENT: Normocephalic, atraumatic, sclera non-icteric. Neck: No JVD or bruits. Cardiac: Irregularly irregular, rate controlled, no murmurs, rubs, or gallops.  Radials/DP/PT 1+ and equal bilaterally.  Respiratory: Clear to auscultation bilaterally. Breathing is unlabored. GI: Soft, nontender, non-distended, BS +x 4. MS: no deformity. Extremities: No clubbing  or cyanosis. No edema. Distal pedal pulses are 2+ and equal bilaterally. Neuro:  Alert and oriented this morning, hard of hearing. Follows commands. Psych:  Responds to questions appropriately with a normal affect.  Labs    High Sensitivity Troponin:  No results for input(s): TROPONINIHS in the last 720 hours.    Cardiac EnzymesNo results for input(s): TROPONINI in the last 168 hours. No results for input(s): TROPIPOC in the last 168 hours.   Chemistry Recent Labs  Lab 05/11/20 1351 05/12/20 1757 05/13/20 0828  NA 135 138 136  K 3.9 4.6 4.0  CL 103 104 103  CO2 24 25 25   GLUCOSE 189* 150* 206*  BUN 24* 30* 24*  CREATININE 1.09 1.29* 1.23  CALCIUM 9.2 9.7 9.3  GFRNONAA 59* 48* 51*  ANIONGAP 8 9 8      Hematology Recent Labs  Lab 05/11/20 1351 05/12/20 1757 05/13/20 0828  WBC 7.9 10.5 10.1  RBC 3.74* 3.95* 3.84*  HGB 12.1* 13.0 12.8*  HCT 38.3* 40.1 38.2*  MCV 102.4* 101.5* 99.5  MCH 32.4 32.9 33.3  MCHC 31.6 32.4 33.5  RDW 13.1 13.0 12.7  PLT 260 297 265    BNPNo results for input(s): BNP, PROBNP in the last 168 hours.   DDimer No results for input(s): DDIMER in the last 168 hours.   Radiology    CT Head Wo Contrast  Result Date: 05/11/2020 CLINICAL DATA:  Pain following  trauma EXAM: CT HEAD WITHOUT CONTRAST TECHNIQUE: Contiguous axial images were obtained from the base of the skull through the vertex without intravenous contrast. COMPARISON:  None. FINDINGS: Brain: There is mild diffuse atrophy. There is no intracranial mass, hemorrhage, extra-axial fluid collection, or midline shift. There is slight small vessel disease in the centra semiovale bilaterally. No acute infarct is evident. Vascular: No evident hyperdense vessel. There is calcification in each carotid siphon region. Skull: Bony calvarium appears intact. Sinuses/Orbits: There is a retention cyst in the anterior left sphenoid sinus. There is mucosal thickening in several ethmoid air cells. There is  rightward deviation of the nasal septum. The orbits appear symmetric bilaterally. Other: Mastoid air cells are clear. IMPRESSION: Atrophy with slight periventricular small vessel disease. No mass or hemorrhage. No acute infarct. There are foci of arterial vascular calcification. There are foci of paranasal sinus disease. There is deviation of the nasal septum. Electronically Signed   By: Lowella Grip III M.D.   On: 05/11/2020 15:06   CT Cervical Spine Wo Contrast  Result Date: 05/11/2020 CLINICAL DATA:  Pain following fall.  Unsteady gait EXAM: CT CERVICAL SPINE WITHOUT CONTRAST TECHNIQUE: Multidetector CT imaging of the cervical spine was performed without intravenous contrast. Multiplanar CT image reconstructions were also generated. COMPARISON:  None. FINDINGS: Alignment: There is no appreciable spondylolisthesis. Skull base and vertebrae: The skull base and craniocervical junction regions appear normal. No evident acute fracture. No blastic or lytic bone lesions. There are cystic areas in the odontoid region. Soft tissues and spinal canal: Prevertebral soft tissues and predental space regions are normal. There is no evident cord or canal hematoma. No paraspinous lesions are appreciable. Disc levels: There is moderately severe disc space narrowing at C3-4, C5-6, C6-7, and C7-T1. There is facet hypertrophy to varying degrees at all levels. There is impression on the exiting nerve root with relative nerve root effacement at C4-5 on the left and at C5-6 on the right. No frank disc extrusion or stenosis evident. Upper chest: Visualized upper lung regions are clear. Other: There are foci of subclavian and carotid artery calcification bilaterally. Retention cyst noted in anterior left sphenoid sinus. IMPRESSION: No appreciable fracture or spondylolisthesis. Extensive multifocal arthropathy. Impression on exiting nerve roots due to bony hypertrophy at C4-5 on the left at C5-6 on the right. No frank disc  extrusion or stenosis. Foci of subclavian and carotid artery calcification bilaterally noted. Electronically Signed   By: Lowella Grip III M.D.   On: 05/11/2020 15:15    Cardiac Studies   2018 Echo   - Left ventricle: The cavity size was normal. Systolic function was  normal. The estimated ejection fraction was in the range of 55%  to 60%. Wall motion was normal; there were no regional wall  motion abnormalities. The study is not technically sufficient to  allow evaluation of LV diastolic function.  - Aorta: Aortic root dimension: 41 mm (ED).  - Ascending aorta: The ascending aorta was mildly dilated.  - Mitral valve: Calcified annulus.  - Left atrium: The atrium was mildly dilated.  - Right ventricle: The cavity size was mildly dilated. Wall  thickness was normal.  - Tricuspid valve: There was mild regurgitation  Patient Profile     85 y.o. male with history of abnormal stress test in 2014 (managed conservatively), permanent atrial fibrillation, BPH, HLD, hypothyroidism, lymphoma, prostate CA, PVCs, intermittent RBBB, sleep apnea (pt refuses cpap), bradycardia, mildly dilated root/ascending aorta 2018, mild cardiomyopathy in 2017 (normal EF 2018), non-Hodgkin's lymphoma  20 years ago status post chemotherapy and radiation, carotid disease, traumatic hematoma of the thigh in 09/2019 who presented to [facility] with progressive weakness/fatigue and fall with hitting his head. Found to have slow ventricular response with low HR/pauses.  Assessment & Plan    1. Permanent atrial fibrillation with slow ventricular response/symptomatic bradycardia - diltiazem stopped, given calcium gluconate, ropinirole also decreased -> bradycardia resolved, HR now 50s-60s - continue apixaban, dose reviewed and remains appropriate but needs close OP f/u for falls - will review plan with Dr. Harrington Challenger - question whether needs EP involvement as previously suggested given that HR has now normalized off  diltiazem  2. Fall - suspected secondary to bradycardia but will need close f/u as outpatient given ongoing anticoagulation - CT head without acute hemorrhage, chronic changes noted - will request PT/OT consultation to determine any home health needs  3. Hypothyroidism - TSH in process - continue levothyroxine  4. Mild anemia - mild macrocytosis noted, recommend OP monitoring - get B12/folate with AM labs if still here  5. CKD stage III - baseline Cr around 1.2 and currently stable  6. Hyperglycemia - check A1c with AM labs   For questions or updates, please contact [place] Please consult [email or web address] for contact info under Cardiology/STEMI.  Signed, [name], PA-C 05/13/2020, 9:27 AM    Patient seen and examined  I agree with findings as noted by D Dunn Above   Pt just getting back from walking   No dizziness  Breathing is OK On exam Lungs are CTA Card  Irreg irreg  On S3  No murmurs Abd is supple  Ext are without edeam  Rates in afib are slow but pt is not dizzy  BP is OK  Follow off meds  Goal is to get PT to see pt, determine home health needs  Dorris Carnes MD

## 2020-05-13 NOTE — TOC Transition Note (Addendum)
Transition of Care [facility]) - CM/SW Discharge Note   Patient Details  Name: Travis Bradford MRN: [id number] Date of Birth: [birth year]  Transition of [facility]) CM/SW Contact:  Zenon Mayo, RN Phone Number: 05/13/2020, 5:20 PM   Clinical Narrative:    NCM spoke with patient at bedside, offered choice for HHPT, he states he does not have a preference, NCM made referral to [place] with [facility] for [place], she states she is able to take referral.  Soc will begin 24 to 48 hrs post dc.  Patient states he does not have any DME at home. PT eval rec 3 n 1 and rolling walker, NCM made referral to [employer] . They will bring to room prior to dc. Patient has changed his mind and states he would like to have [facility] for HHPT, awaiting call back, NCM made referral to [facility] with [facility] for HHPT. Per Adela Lank with [employer] the son states patient has a walker and a 3 n 1 at home already. [facility] was not able to take patient, NCM informed son, he stated to keep [facility] for [place].  NCM informed [place] with [employer].    Final next level of care: [place] Barriers to Discharge: Continued Medical Work up   Patient Goals and CMS Choice Patient states their goals for this hospitalization and ongoing recovery are:: get better CMS Medicare.gov Compare Post Acute Care list provided to:: Patient Choice offered to / list presented to : Patient  Discharge Placement                       Discharge Plan and Services                DME Arranged: 3-N-1, Walker rolling DME Agency: [employer] Date DME Agency Contacted: 05/13/20 Time DME Agency Contacted: [id number] Representative spoke with at DME Agency: [place]: PT [place]: [facility] [place] Date [place]: 05/13/20 Time [place]: [id number] Representative spoke with at [place]: [place] ([place]) Interventions     Readmission Risk Interventions No flowsheet data found.

## 2020-05-14 ENCOUNTER — Encounter: Payer: Self-pay | Admitting: Physician Assistant

## 2020-05-14 DIAGNOSIS — R2681 Unsteadiness on feet: Secondary | ICD-10-CM

## 2020-05-14 DIAGNOSIS — I4891 Unspecified atrial fibrillation: Secondary | ICD-10-CM | POA: Diagnosis not present

## 2020-05-14 DIAGNOSIS — G4730 Sleep apnea, unspecified: Secondary | ICD-10-CM

## 2020-05-14 DIAGNOSIS — R030 Elevated blood-pressure reading, without diagnosis of hypertension: Secondary | ICD-10-CM

## 2020-05-14 LAB — BASIC METABOLIC PANEL
Anion gap: 7 (ref 5–15)
BUN: 22 mg/dL (ref 8–23)
CO2: 29 mmol/L (ref 22–32)
Calcium: 9.5 mg/dL (ref 8.9–10.3)
Chloride: 103 mmol/L (ref 98–111)
Creatinine, Ser: 1 mg/dL (ref 0.61–1.24)
GFR, Estimated: >60 mL/min (ref >60)
Glucose, Bld: 139 mg/dL — ABNORMAL HIGH (ref 70–99)
Potassium: 4.2 mmol/L (ref 3.5–5.1)
Sodium: 139 mmol/L (ref 135–145)

## 2020-05-14 LAB — CBC
HCT: 39.1 % (ref 39.0–52.0)
Hemoglobin: 12.7 g/dL — ABNORMAL LOW (ref 13.0–17.0)
MCH: 32.4 pg (ref 26.0–34.0)
MCHC: 32.5 g/dL (ref 30.0–36.0)
MCV: 99.7 fL (ref 80.0–100.0)
Platelets: 285 10*3/uL (ref 150–400)
RBC: 3.92 MIL/uL — ABNORMAL LOW (ref 4.22–5.81)
RDW: 12.6 % (ref 11.5–15.5)
WBC: 10.4 10*3/uL (ref 4.0–10.5)
nRBC: 0 % (ref 0.0–0.2)

## 2020-05-14 LAB — VITAMIN B12: Vitamin B-12: 342 pg/mL (ref 180–914)

## 2020-05-14 LAB — FOLATE: Folate: 9.8 ng/mL (ref >5.9)

## 2020-05-14 MED ORDER — ROPINIROLE HCL 2 MG PO TABS: 2.0000 mg | ORAL_TABLET | Freq: Every day | ORAL | 0 refills | Status: AC

## 2020-05-14 NOTE — Progress Notes (Signed)
Pt pulled his tele leads again, upon putting it back, Pt shouted "get out of here, I am trying to get back to sleep" Pt agitated. Tele put to standby for now.

## 2020-05-14 NOTE — Progress Notes (Signed)
  Progress Note   Date: 05/14/2020  Patient Name: Travis Bradford        MRN#: [id number]  Clarification of diagnosis:  CKD Stage IIIa

## 2020-05-14 NOTE — Discharge Summary (Signed)
Discharge Summary    Patient ID: Travis Bradford MRN: [id number]; DOB: 30-May-1926  Admit date: 05/11/2020 Discharge date: 05/14/2020  Primary Care Provider: Patient, No Pcp Per  Primary Cardiologist: [name], MD  Primary Electrophysiologist:  None   Discharge Diagnoses    Principal Problem:   Atrial fibrillation with slow ventricular response ([place]) Active Problems:   Anemia   Hypothyroidism   Dizziness   Fall   Gait instability   Elevated blood-pressure reading without diagnosis of hypertension   Sleep apnea    Diagnostic Studies/Procedures    N/A _____________   History of Present Illness     Travis Bradford is a 86 y.o.malewith history of abnormal stress test in 2014 (managed conservatively), permanent atrial fibrillation, BPH, HLD, hypothyroidism, lymphoma, prostate CA, PVCs, intermittent RBBB, sleep apnea (pt adamantly declines cpap), bradycardia, mildly dilated root/ascending aorta 2018, mild cardiomyopathy in 2017 (normal EF 2018),non-Hodgkin's lymphoma 20 years ago status post chemotherapy and radiation, carotid disease, traumatic hematoma of the thigh in 09/2019. He presented to [facility] with progressive weakness/fatigue and fall with hitting his head. He was found to have slow ventricular response with HR in the 20s-30s and occasional pauses. He was otherwise hemodynamically stable and did not require emergent pacing but was admitted for further management.   Hospital Course     1. Permanent atrial fibrillation with slow ventricular response/symptomatic bradycardia - given calcium gluconate, diltiazem stopped, ropinirole also decreased -> bradycardia resolved, HR now 50s-60s - continue apixaban (dose reviewed and remains appropriate but needs close OP f/u for falls) - no present indication for pacemaker - re: ropinerole dosing, per discussion with pharmacy since this does not come in 2.5mg  tablet, would instead send home on the 2mg  tablet which he has at home -  son [name] is very involved, lives with patient, and will continue to monitor HR at home daily  2. Fall - suspected secondary to bradycardia but will need close f/u as outpatient given ongoing anticoagulation - CT head without acute hemorrhage, chronic changes noted - PT has seen patient and home health/DME arranged  3. Hypothyroidism - TSH wnl - continue levothyroxine  4. Mild anemia - mild macrocytosis noted, generally stable during admission, can be monitored as outpatient - B12/folate OK  5. CKD stage III - baseline Cr around 1.2 and stable this admission  6. Hyperglycemia - A1C pending, will need OP follow-up although patient has unfortunately historically declined to establish with PCP  7. Elevated BP - usually does not have hx of HTN but now off diltiazem - some of this may be related to being agitated from being in the hospital - discussed home monitoring with son, likely hold off on agents at discharge and follow as OP  The patient is feeling better today and HR is stable this morning. He unfortunately declined to wear telemetry leads further (has had intermittent agitation this admission) but vital signs are stable, he has remained stable overnight, and he has no further cardiac complaints. Dr. Harrington Challenger has seen and examined the patient today and feels he is stable for discharge. We did advise he refrain from driving given his HR issues and general delay in memory/reaction time.  Did the patient have an acute coronary syndrome (MI, NSTEMI, STEMI, etc) this admission?:  No                               Did the patient have a percutaneous coronary intervention (stent /  angioplasty)?:  No.   _____________  Discharge Vitals Blood pressure (!) 160/75, pulse (!) 55, temperature 98 F (36.7 C), temperature source Oral, resp. rate 18, height 5\' 7"  (1.702 m), weight 97.9 kg, SpO2 99 %.  Filed Weights   05/11/20 2119 05/12/20 0649 05/14/20 0124  Weight: 96.8 kg 96 kg 97.9 kg     Labs & Radiologic Studies    CBC Recent Labs    05/11/20 1351 05/12/20 1757 05/13/20 0828 05/14/20 0714  WBC 7.9   < > 10.1 10.4  NEUTROABS 5.8  --   --   --   HGB 12.1*   < > 12.8* 12.7*  HCT 38.3*   < > 38.2* 39.1  MCV 102.4*   < > 99.5 99.7  PLT 260   < > 265 285   < > = values in this interval not displayed.   Basic Metabolic Panel Recent Labs    05/13/20 0828 05/14/20 0714  NA 136 139  K 4.0 4.2  CL 103 103  CO2 25 29  GLUCOSE 206* 139*  BUN 24* 22  CREATININE 1.23 1.00  CALCIUM 9.3 9.5   Thyroid Function Tests Recent Labs    05/13/20 0828  TSH 3.677   _____________  CT Head Wo Contrast  Result Date: 05/11/2020 CLINICAL DATA:  Pain following trauma EXAM: CT HEAD WITHOUT CONTRAST TECHNIQUE: Contiguous axial images were obtained from the base of the skull through the vertex without intravenous contrast. COMPARISON:  None. FINDINGS: Brain: There is mild diffuse atrophy. There is no intracranial mass, hemorrhage, extra-axial fluid collection, or midline shift. There is slight small vessel disease in the centra semiovale bilaterally. No acute infarct is evident. Vascular: No evident hyperdense vessel. There is calcification in each carotid siphon region. Skull: Bony calvarium appears intact. Sinuses/Orbits: There is a retention cyst in the anterior left sphenoid sinus. There is mucosal thickening in several ethmoid air cells. There is rightward deviation of the nasal septum. The orbits appear symmetric bilaterally. Other: Mastoid air cells are clear. IMPRESSION: Atrophy with slight periventricular small vessel disease. No mass or hemorrhage. No acute infarct. There are foci of arterial vascular calcification. There are foci of paranasal sinus disease. There is deviation of the nasal septum. Electronically Signed   By: Lowella Grip III M.D.   On: 05/11/2020 15:06   CT Cervical Spine Wo Contrast  Result Date: 05/11/2020 CLINICAL DATA:  Pain following fall.   Unsteady gait EXAM: CT CERVICAL SPINE WITHOUT CONTRAST TECHNIQUE: Multidetector CT imaging of the cervical spine was performed without intravenous contrast. Multiplanar CT image reconstructions were also generated. COMPARISON:  None. FINDINGS: Alignment: There is no appreciable spondylolisthesis. Skull base and vertebrae: The skull base and craniocervical junction regions appear normal. No evident acute fracture. No blastic or lytic bone lesions. There are cystic areas in the odontoid region. Soft tissues and spinal canal: Prevertebral soft tissues and predental space regions are normal. There is no evident cord or canal hematoma. No paraspinous lesions are appreciable. Disc levels: There is moderately severe disc space narrowing at C3-4, C5-6, C6-7, and C7-T1. There is facet hypertrophy to varying degrees at all levels. There is impression on the exiting nerve root with relative nerve root effacement at C4-5 on the left and at C5-6 on the right. No frank disc extrusion or stenosis evident. Upper chest: Visualized upper lung regions are clear. Other: There are foci of subclavian and carotid artery calcification bilaterally. Retention cyst noted in anterior left sphenoid sinus. IMPRESSION: No appreciable  fracture or spondylolisthesis. Extensive multifocal arthropathy. Impression on exiting nerve roots due to bony hypertrophy at C4-5 on the left at C5-6 on the right. No frank disc extrusion or stenosis. Foci of subclavian and carotid artery calcification bilaterally noted. Electronically Signed   By: Lowella Grip III M.D.   On: 05/11/2020 15:15   Disposition   Pt is being discharged home today in good condition.  Follow-up Plans & Appointments     Follow-up [facility] Follow up.   Why: rolling walker, 3 n 1 Contact information: [place] [address] [phone]        Lelon Perla, MD Follow up.   Specialty: Cardiology Why: Keep follow-up as  scheduled with Dr. [name] on Tuesday May 21, 2020 3:40 PM (Arrive by 3:25 PM). Contact information: [address] [place] [address] [phone]              Discharge Instructions    Diet - low sodium heart healthy   Complete by: As directed    Discharge instructions   Complete by: As directed    Your dilitazem has been stopped due to slow heart rate.  Your ropinirole dose has been decreased to 2mg  nightly because of your slow heart rate.  Please monitor your blood pressure occasionally at home. Call your doctor if you tend to get readings of greater than 130-140 on the top number regularly.   Increase activity slowly   Complete by: As directed    We would advise that you do not drive given your heart rate issues and delays in memory/reaction time.      Discharge Medications   Allergies as of 05/14/2020      Reactions   Tape Other (See Comments)   THE PATIENT'S SKIN IS VERY THIN- TEARS AND BRUISES EASILY!!!!      Medication List    STOP taking these medications   diltiazem 120 MG 24 hr capsule Commonly known as: Cardizem CD     TAKE these medications   D3-50 1.25 MG (50000 UT) capsule Generic drug: Cholecalciferol Take 50,000 Units by mouth every Monday.   Eliquis 5 MG Tabs tablet Generic drug: apixaban Take 5 mg by mouth 2 (two) times daily.   levothyroxine 25 MCG tablet Commonly known as: SYNTHROID Take 25 mcg by mouth daily.   rOPINIRole 2 MG tablet Commonly known as: REQUIP Take 1 tablet (2 mg total) by mouth at bedtime. What changed: when to take this            Durable Medical Equipment  (From admission, onward)         Start     Ordered   05/13/20 1449  For home use only DME Walker rolling  Once       Question Answer Comment  Walker: With [place]   Patient needs a walker to treat with the following condition Gait instability      05/13/20 1448   05/13/20 1447  For home use only DME 3 n 1  Once         05/13/20 1446             Outstanding Labs/Studies   A1C pending  Duration of Discharge Encounter   Greater than 30 minutes including physician time.  Signed, [name], PA-C 05/14/2020, 9:22 AM

## 2020-05-14 NOTE — Progress Notes (Addendum)
Progress Note  Patient Name: Travis Bradford Date of Encounter: 05/14/2020  Primary Cardiologist: [name], MD  Subjective   Son feels patient is doing well this morning. Mr. Kopke himself is generally unhappy with the accommodations and food, denies any acute complaints though. He pulled off telemetry earlier but HR generally 50s-60s on the data obtained and f/u VS this AM were normal except BP trending up.  Inpatient Medications    Scheduled Meds:  apixaban  5 mg Oral BID   levothyroxine  25 mcg Oral Daily   ropinirole  2.5 mg Oral QHS   Continuous Infusions:   PRN Meds: acetaminophen **OR** acetaminophen   Vital Signs    Vitals:   05/13/20 1210 05/13/20 2047 05/14/20 0124 05/14/20 0443  BP: 134/70 (!) 152/63  (!) 160/75  Pulse: 61 70  (!) 55  Resp:  18  18  Temp:  98.4 F (36.9 C)  98 F (36.7 C)  TempSrc:  Oral  Oral  SpO2: 100% 99%  99%  Weight:   97.9 kg   Height:        Intake/Output Summary (Last 24 hours) at 05/14/2020 0808 Last data filed at 05/14/2020 0449 Gross per 24 hour  Intake --  Output 400 ml  Net -400 ml   Last 3 Weights 05/14/2020 05/12/2020 05/11/2020  Weight (lbs) 215 lb 12.8 oz 211 lb 9.6 oz 213 lb 6.5 oz  Weight (kg) 97.886 kg 95.981 kg 96.8 kg     Telemetry    Atrial fib HR 50s-60s until patient pulled off telemetry overnight (artifact noted around this time) - Personally Reviewed  Physical Exam   GEN: No acute distress.  HEENT: Normocephalic, atraumatic, sclera non-icteric. Neck: No JVD or bruits. Cardiac: Irregularly irregular, normal rate, no murmurs, rubs, or gallops.  Radials/DP/PT 1+ and equal bilaterally.  Respiratory: Clear to auscultation bilaterally. Breathing is unlabored. GI: Soft, nontender, non-distended, BS +x 4. MS: no deformity. Extremities: No clubbing or cyanosis. No edema. Distal pedal pulses are 2+ and equal bilaterally. Neuro:  Alert and oriented this morning, hard of hearing. Follows  commands. Psych:  Responds to questions appropriately with a disgruntled affect.  Labs    High Sensitivity Troponin:  No results for input(s): TROPONINIHS in the last 720 hours.    Cardiac EnzymesNo results for input(s): TROPONINI in the last 168 hours. No results for input(s): TROPIPOC in the last 168 hours.   Chemistry Recent Labs  Lab 05/11/20 1351 05/12/20 1757 05/13/20 0828  NA 135 138 136  K 3.9 4.6 4.0  CL 103 104 103  CO2 24 25 25   GLUCOSE 189* 150* 206*  BUN 24* 30* 24*  CREATININE 1.09 1.29* 1.23  CALCIUM 9.2 9.7 9.3  GFRNONAA 59* 48* 51*  ANIONGAP 8 9 8      Hematology Recent Labs  Lab 05/11/20 1351 05/12/20 1757 05/13/20 0828  WBC 7.9 10.5 10.1  RBC 3.74* 3.95* 3.84*  HGB 12.1* 13.0 12.8*  HCT 38.3* 40.1 38.2*  MCV 102.4* 101.5* 99.5  MCH 32.4 32.9 33.3  MCHC 31.6 32.4 33.5  RDW 13.1 13.0 12.7  PLT 260 297 265    BNPNo results for input(s): BNP, PROBNP in the last 168 hours.   DDimer No results for input(s): DDIMER in the last 168 hours.   Radiology    No results found.  Cardiac Studies   2018 Echo   - Left ventricle: The cavity size was normal. Systolic function was    normal. The estimated ejection  fraction was in the range of 55%    to 60%. Wall motion was normal; there were no regional wall    motion abnormalities. The study is not technically sufficient to    allow evaluation of LV diastolic function.  - Aorta: Aortic root dimension: 41 mm (ED).  - Ascending aorta: The ascending aorta was mildly dilated.  - Mitral valve: Calcified annulus.  - Left atrium: The atrium was mildly dilated.  - Right ventricle: The cavity size was mildly dilated. Wall    thickness was normal.  - Tricuspid valve: There was mild regurgitation  Patient Profile     86 y.o. male with history of abnormal stress test in 2014 (managed conservatively), permanent atrial fibrillation, BPH, HLD, hypothyroidism, lymphoma, prostate CA, PVCs, intermittent RBBB, sleep  apnea (pt refuses cpap), bradycardia, mildly dilated root/ascending aorta 2018, mild cardiomyopathy in 2017 (normal EF 2018), non-Hodgkin's lymphoma 20 years ago status post chemotherapy and radiation, carotid disease, traumatic hematoma of the thigh in 09/2019 who presented to [facility] with progressive weakness/fatigue and fall with hitting his head. Found to have slow ventricular response with low HR/pauses.  Assessment & Plan    1. Permanent atrial fibrillation with slow ventricular response/symptomatic bradycardia - diltiazem stopped, given calcium gluconate, ropinirole also decreased -> bradycardia resolved, HR now 50s-60s - continue apixaban, dose reviewed and remains appropriate but needs close OP f/u for falls - no present indication for pacemaker - son will continue to monitor HR at home daily   2. Fall - suspected secondary to bradycardia but will need close f/u as outpatient given ongoing anticoagulation - CT head without acute hemorrhage, chronic changes noted - PT has seen patient and home health/DME arranged   3. Hypothyroidism - TSH wnl - continue levothyroxine   4. Mild anemia - mild macrocytosis noted, recommend OP monitoring - B12/folate in process from this morning   5. CKD stage III - baseline Cr around 1.2 and currently stable - f/u labs pending this AM   6. Hyperglycemia - A1C in process  7. Elevated BP - usually does not have hx of HTN but now off diltiazem - some of this may be related to being agitated from being in the hospital - discussed home monitoring with son, likely hold off on agents at discharge and follow as OP  Already had f/u with Dr. [name] on 05/21/20 which we will keep for HR check and follow-up.  For questions or updates, please contact [place] Please consult [email or web address] for contact info under Cardiology/STEMI.  Signed, [name], PA-C 05/14/2020, 8:08 AM    Pt seen and examined  I agree with findings as noted above by D  DUnn Pt's HR and BP are OK  Keep on same regimen ON exam, pt comfortable Lungs are Clear Cardiac Irreg irreg  No S3 Ext are without edema  OK for D/C today   Plan for outpt f/u on 10/26  Dorris Carnes MD

## 2020-05-14 NOTE — Progress Notes (Signed)
Patient was discharged home by MD order; discharged instructions  reviewed and given to patient's son Legend Tumminello, IV DIC; skin intact; patient will be escorted to the car by nurse tech via wheelchair.

## 2020-05-15 ENCOUNTER — Telehealth: Payer: Self-pay

## 2020-05-15 LAB — HEMOGLOBIN A1C
Hgb A1c MFr Bld: 7.4 % — ABNORMAL HIGH (ref 4.8–5.6)
Mean Plasma Glucose: 166 mg/dL

## 2020-05-15 NOTE — Progress Notes (Deleted)
HPI: Follow-up atrial fibrillation. Stress echocardiogram January 2014 showed hypokinesis of the mid and distal inferior wall suggestive of ischemia. We treated medically. Patient had a CT of his neck in January 2017 that showed calcium in both carotidsandcoronaries. Echocardiogram July 2017 showed ejection fraction 40-45%with hypokinesis of the basal and mid inferior lateral and inferior myocardium. Mild biatrial enlargement, mild right ventricular enlargement and mild mitral regurgitation. Patient seen for new onset atrial fibrillation in July 2017.Patient placed on metoprolol and anticoagulation. Metoprolol subsequent change to Cardizem because of fatigue.Echocardiogram repeated September 2018 and showed normal LV function, mildly dilated aortic root and ascending aorta, mild left atrial enlargement and mild right ventricular enlargement.  Patient had traumatic hematoma of right thigh March 2021.  Patient just discharged from the hospital following admission for bradycardia, weakness and fall striking his head.  Heart rate was in the 20s to 30s.  Cardizem and ropinirole discontinued and heart rate improved.  Head CT showed no bleed.  Since last seen  Current Outpatient Medications  Medication Sig Dispense Refill  . apixaban (ELIQUIS) 5 MG TABS tablet Take 5 mg by mouth 2 (two) times daily.    . Cholecalciferol (D3-50) 1.25 MG (50000 UT) capsule Take 50,000 Units by mouth every Monday.    . levothyroxine (SYNTHROID) 25 MCG tablet Take 25 mcg by mouth daily.     [name] rOPINIRole (REQUIP) 2 MG tablet Take 1 tablet (2 mg total) by mouth at bedtime. 30 tablet 0   No current facility-administered medications for this visit.     Past Medical History:  Diagnosis Date  . Abnormal stress test    a. 07/2012: abnormal stress echo showing hypokinesis of the mid-distal inferior wall suggestive of ischemia; + frequent PVCs, no ST changes or CP. Dr. [name] had a long discussion with the patient about  further w/u versus medical therapy and the patient elected medical therapy..  . Arthritis   . Atrial fibrillation ([place])    a. Dx 01/2016.  [name] BPH (benign prostatic hyperplasia)   . Bradycardia   . Dizziness 05/11/2020  . History of blood transfusion   . Hyperlipidemia   . Hypothyroid   . Lymphoma ([place])    15 years ago  . Pneumonia   . Prostate cancer ([place])   . PVC's (premature ventricular contractions)    a. 07/2012 - seen on stress echo.  [name] RBBB    a. h/o intermittent rbbb.  . Restless legs syndrome   . Sinus bradycardia   . Sleep apnea    does not use a cpap    Past Surgical History:  Procedure Laterality Date  . APPENDECTOMY    . Athroscopic knee surgery    . COLONOSCOPY    . dental implants    . GREEN LIGHT LASER TURP (TRANSURETHRAL RESECTION OF PROSTATE  08/29/2012   Procedure: GREEN LIGHT LASER TURP (TRANSURETHRAL RESECTION OF PROSTATE;  Surgeon: [name], MD;  Location: [facility];  Service: Urology;  Laterality: N/A;  . HEMATOMA EVACUATION Right 10/11/2019   Procedure: RIGHT LEG EVACUATION OF DEEP HEMATOMA;  Surgeon: [name], MD;  Location: [facility];  Service: Orthopedics;  Laterality: Right;  . MOHS SURGERY     x 6    Social History   Socioeconomic History  . Marital status: Widowed    Spouse name: Not on file  . Number of children: 2  . Years of education: Not on file  . Highest education level: Not on file  Occupational  History  . Not on file  Tobacco Use  . Smoking status: Former Research scientist (life sciences)  . Smokeless tobacco: Never Used  Vaping Use  . Vaping Use: Never used  Substance and Sexual Activity  . Alcohol use: Not Currently    Comment: Rarely  . Drug use: Never  . Sexual activity: Not on file  Other Topics Concern  . Not on file  Social History Narrative  . Not on file   Social Determinants of Health   Financial Resource Strain:   . Difficulty of Paying Living Expenses: Not on file  Food Insecurity:   . Worried About Charity fundraiser in  the Last Year: Not on file  . Ran Out of Food in the Last Year: Not on file  Transportation Needs:   . Lack of Transportation (Medical): Not on file  . Lack of Transportation (Non-Medical): Not on file  Physical Activity:   . Days of Exercise per Week: Not on file  . Minutes of Exercise per Session: Not on file  Stress:   . Feeling of Stress : Not on file  Social Connections:   . Frequency of Communication with Friends and Family: Not on file  . Frequency of Social Gatherings with Friends and Family: Not on file  . Attends Religious Services: Not on file  . Active Member of Clubs or Organizations: Not on file  . Attends Archivist Meetings: Not on file  . Marital Status: Not on file  Intimate Partner Violence:   . Fear of Current or Ex-Partner: Not on file  . Emotionally Abused: Not on file  . Physically Abused: Not on file  . Sexually Abused: Not on file    Family History  Problem Relation Age of Onset  . CAD Mother   . Stroke Brother         X 2    ROS: no fevers or chills, productive cough, hemoptysis, dysphasia, odynophagia, melena, hematochezia, dysuria, hematuria, rash, seizure activity, orthopnea, PND, pedal edema, claudication. Remaining systems are negative.  Physical Exam: Well-developed well-nourished in no acute distress.  Skin is warm and dry.  HEENT is normal.  Neck is supple.  Chest is clear to auscultation with normal expansion.  Cardiovascular exam is regular rate and rhythm.  Abdominal exam nontender or distended. No masses palpated. Extremities show no edema. neuro grossly intact  ECG- personally reviewed  A/P  1 permanent atrial fibrillation-patient recently admitted following severe bradycardia which improved following discontinuation of Cardizem.  Heart rate is controlled on no AV nodal blocking agents.  We will continue apixaban for now.  However low threshold to discontinue if he has further falls in the future.  2  hyperlipidemia-managed by primary care.  3 history of abnormal functional study-patient again denies chest pain.  Plan conservative measures given his age and overall medical condition.  Continue medical therapy.  4 history of mild cardiomyopathy-improved on most recent echocardiogram.  Previous reduction possibly secondary to tachycardia.  Heart rate now controlled.  5 history of obstructive sleep apnea-refuses CPAP.  [name], MD

## 2020-05-15 NOTE — Telephone Encounter (Signed)
-----   Message from [name], [place] sent at 05/15/2020  8:06 AM EDT ----- Hospital lab. Patient recently admitted for slow HR. His son [name] helps with his care as patient is hard of hearing, has some memory difficulties. He has declined to establish with PCP in the past. Let them know his A1c is 7.4 which indicates diabetes - definitely needs to get into see a primary care to help manage. Keep f/u with Dr [name] as planned otherwise.

## 2020-05-15 NOTE — Telephone Encounter (Signed)
Spoke with the patients son, [name] and informed him of DD, PA recommendations regarding A1C and following up with PCP. Patient still does not have PCP but his son did say he has an upcoming appointment with the [facility] who will hopefully facilitate a PCP for the patient. Reminded [name] of Mr. Glasscock appointment with Dr. [name] on 10/26. Patient was discharged from the hospital yesterday and is doing much better.

## 2020-05-17 DIAGNOSIS — I450 Right fascicular block: Secondary | ICD-10-CM | POA: Diagnosis not present

## 2020-05-17 DIAGNOSIS — I429 Cardiomyopathy, unspecified: Secondary | ICD-10-CM | POA: Diagnosis not present

## 2020-05-17 DIAGNOSIS — I129 Hypertensive chronic kidney disease with stage 1 through stage 4 chronic kidney disease, or unspecified chronic kidney disease: Secondary | ICD-10-CM | POA: Diagnosis not present

## 2020-05-17 DIAGNOSIS — I4811 Longstanding persistent atrial fibrillation: Secondary | ICD-10-CM | POA: Diagnosis not present

## 2020-05-17 DIAGNOSIS — N401 Enlarged prostate with lower urinary tract symptoms: Secondary | ICD-10-CM | POA: Diagnosis not present

## 2020-05-17 DIAGNOSIS — D631 Anemia in chronic kidney disease: Secondary | ICD-10-CM | POA: Diagnosis not present

## 2020-05-17 DIAGNOSIS — M1990 Unspecified osteoarthritis, unspecified site: Secondary | ICD-10-CM | POA: Diagnosis not present

## 2020-05-17 DIAGNOSIS — N183 Chronic kidney disease, stage 3 unspecified: Secondary | ICD-10-CM | POA: Diagnosis not present

## 2020-05-17 DIAGNOSIS — J449 Chronic obstructive pulmonary disease, unspecified: Secondary | ICD-10-CM | POA: Diagnosis not present

## 2020-05-17 DIAGNOSIS — I4510 Unspecified right bundle-branch block: Secondary | ICD-10-CM | POA: Diagnosis not present

## 2020-05-18 ENCOUNTER — Inpatient Hospital Stay (HOSPITAL_COMMUNITY): Payer: Medicare HMO

## 2020-05-18 ENCOUNTER — Encounter (HOSPITAL_COMMUNITY): Payer: Self-pay | Admitting: *Deleted

## 2020-05-18 ENCOUNTER — Telehealth: Payer: Self-pay | Admitting: Physician Assistant

## 2020-05-18 ENCOUNTER — Emergency Department (HOSPITAL_COMMUNITY): Payer: Medicare HMO

## 2020-05-18 ENCOUNTER — Inpatient Hospital Stay (HOSPITAL_COMMUNITY)
Admission: EM | Admit: 2020-05-18 | Discharge: 2020-05-27 | DRG: 177 | Disposition: E | Payer: Medicare HMO | Attending: Internal Medicine | Admitting: Internal Medicine

## 2020-05-18 DIAGNOSIS — E1129 Type 2 diabetes mellitus with other diabetic kidney complication: Secondary | ICD-10-CM | POA: Diagnosis not present

## 2020-05-18 DIAGNOSIS — N401 Enlarged prostate with lower urinary tract symptoms: Secondary | ICD-10-CM | POA: Diagnosis present

## 2020-05-18 DIAGNOSIS — R350 Frequency of micturition: Secondary | ICD-10-CM | POA: Diagnosis present

## 2020-05-18 DIAGNOSIS — Z7901 Long term (current) use of anticoagulants: Secondary | ICD-10-CM

## 2020-05-18 DIAGNOSIS — R630 Anorexia: Secondary | ICD-10-CM

## 2020-05-18 DIAGNOSIS — Z8572 Personal history of non-Hodgkin lymphomas: Secondary | ICD-10-CM | POA: Diagnosis not present

## 2020-05-18 DIAGNOSIS — I4510 Unspecified right bundle-branch block: Secondary | ICD-10-CM | POA: Diagnosis present

## 2020-05-18 DIAGNOSIS — F0390 Unspecified dementia without behavioral disturbance: Secondary | ICD-10-CM | POA: Diagnosis present

## 2020-05-18 DIAGNOSIS — Z23 Encounter for immunization: Secondary | ICD-10-CM | POA: Diagnosis not present

## 2020-05-18 DIAGNOSIS — G4733 Obstructive sleep apnea (adult) (pediatric): Secondary | ICD-10-CM | POA: Diagnosis present

## 2020-05-18 DIAGNOSIS — Z79890 Hormone replacement therapy (postmenopausal): Secondary | ICD-10-CM

## 2020-05-18 DIAGNOSIS — R6883 Chills (without fever): Secondary | ICD-10-CM | POA: Diagnosis not present

## 2020-05-18 DIAGNOSIS — I429 Cardiomyopathy, unspecified: Secondary | ICD-10-CM | POA: Diagnosis not present

## 2020-05-18 DIAGNOSIS — G2581 Restless legs syndrome: Secondary | ICD-10-CM | POA: Diagnosis present

## 2020-05-18 DIAGNOSIS — Z7189 Other specified counseling: Secondary | ICD-10-CM

## 2020-05-18 DIAGNOSIS — N179 Acute kidney failure, unspecified: Secondary | ICD-10-CM | POA: Diagnosis not present

## 2020-05-18 DIAGNOSIS — Z66 Do not resuscitate: Secondary | ICD-10-CM

## 2020-05-18 DIAGNOSIS — R0602 Shortness of breath: Secondary | ICD-10-CM | POA: Diagnosis not present

## 2020-05-18 DIAGNOSIS — R5381 Other malaise: Secondary | ICD-10-CM | POA: Diagnosis not present

## 2020-05-18 DIAGNOSIS — I4821 Permanent atrial fibrillation: Secondary | ICD-10-CM | POA: Diagnosis present

## 2020-05-18 DIAGNOSIS — E785 Hyperlipidemia, unspecified: Secondary | ICD-10-CM | POA: Diagnosis present

## 2020-05-18 DIAGNOSIS — Z20822 Contact with and (suspected) exposure to covid-19: Secondary | ICD-10-CM | POA: Diagnosis present

## 2020-05-18 DIAGNOSIS — R5383 Other fatigue: Secondary | ICD-10-CM | POA: Diagnosis not present

## 2020-05-18 DIAGNOSIS — R627 Adult failure to thrive: Secondary | ICD-10-CM | POA: Diagnosis present

## 2020-05-18 DIAGNOSIS — J690 Pneumonitis due to inhalation of food and vomit: Secondary | ICD-10-CM | POA: Diagnosis not present

## 2020-05-18 DIAGNOSIS — Z515 Encounter for palliative care: Secondary | ICD-10-CM

## 2020-05-18 DIAGNOSIS — K567 Ileus, unspecified: Secondary | ICD-10-CM | POA: Diagnosis not present

## 2020-05-18 DIAGNOSIS — I509 Heart failure, unspecified: Secondary | ICD-10-CM | POA: Diagnosis not present

## 2020-05-18 DIAGNOSIS — G9341 Metabolic encephalopathy: Secondary | ICD-10-CM | POA: Diagnosis present

## 2020-05-18 DIAGNOSIS — I5021 Acute systolic (congestive) heart failure: Secondary | ICD-10-CM | POA: Diagnosis not present

## 2020-05-18 DIAGNOSIS — E039 Hypothyroidism, unspecified: Secondary | ICD-10-CM | POA: Diagnosis not present

## 2020-05-18 DIAGNOSIS — M1990 Unspecified osteoarthritis, unspecified site: Secondary | ICD-10-CM | POA: Diagnosis present

## 2020-05-18 DIAGNOSIS — J90 Pleural effusion, not elsewhere classified: Secondary | ICD-10-CM | POA: Diagnosis not present

## 2020-05-18 DIAGNOSIS — Y95 Nosocomial condition: Secondary | ICD-10-CM

## 2020-05-18 DIAGNOSIS — R531 Weakness: Secondary | ICD-10-CM | POA: Diagnosis not present

## 2020-05-18 DIAGNOSIS — R0681 Apnea, not elsewhere classified: Secondary | ICD-10-CM | POA: Diagnosis not present

## 2020-05-18 DIAGNOSIS — Z823 Family history of stroke: Secondary | ICD-10-CM

## 2020-05-18 DIAGNOSIS — R4182 Altered mental status, unspecified: Secondary | ICD-10-CM | POA: Diagnosis not present

## 2020-05-18 DIAGNOSIS — I5041 Acute combined systolic (congestive) and diastolic (congestive) heart failure: Secondary | ICD-10-CM | POA: Diagnosis present

## 2020-05-18 DIAGNOSIS — N39498 Other specified urinary incontinence: Secondary | ICD-10-CM | POA: Diagnosis present

## 2020-05-18 DIAGNOSIS — Z87891 Personal history of nicotine dependence: Secondary | ICD-10-CM

## 2020-05-18 DIAGNOSIS — J189 Pneumonia, unspecified organism: Secondary | ICD-10-CM | POA: Diagnosis not present

## 2020-05-18 DIAGNOSIS — Z8546 Personal history of malignant neoplasm of prostate: Secondary | ICD-10-CM

## 2020-05-18 DIAGNOSIS — Z8249 Family history of ischemic heart disease and other diseases of the circulatory system: Secondary | ICD-10-CM

## 2020-05-18 DIAGNOSIS — R000 Tachycardia, unspecified: Secondary | ICD-10-CM | POA: Diagnosis not present

## 2020-05-18 DIAGNOSIS — F32A Depression, unspecified: Secondary | ICD-10-CM | POA: Diagnosis present

## 2020-05-18 DIAGNOSIS — M160 Bilateral primary osteoarthritis of hip: Secondary | ICD-10-CM | POA: Diagnosis not present

## 2020-05-18 DIAGNOSIS — K6389 Other specified diseases of intestine: Secondary | ICD-10-CM | POA: Diagnosis not present

## 2020-05-18 DIAGNOSIS — Z888 Allergy status to other drugs, medicaments and biological substances status: Secondary | ICD-10-CM

## 2020-05-18 DIAGNOSIS — I517 Cardiomegaly: Secondary | ICD-10-CM | POA: Diagnosis not present

## 2020-05-18 DIAGNOSIS — Z79899 Other long term (current) drug therapy: Secondary | ICD-10-CM

## 2020-05-18 LAB — CBC WITH DIFFERENTIAL/PLATELET
Abs Immature Granulocytes: 0.03 10*3/uL (ref 0.00–0.07)
Basophils Absolute: 0 10*3/uL (ref 0.0–0.1)
Basophils Relative: 0 %
Eosinophils Absolute: 0 10*3/uL (ref 0.0–0.5)
Eosinophils Relative: 1 %
HCT: 39.1 % (ref 39.0–52.0)
Hemoglobin: 12.4 g/dL — ABNORMAL LOW (ref 13.0–17.0)
Immature Granulocytes: 0 %
Lymphocytes Relative: 15 %
Lymphs Abs: 1.3 10*3/uL (ref 0.7–4.0)
MCH: 32.4 pg (ref 26.0–34.0)
MCHC: 31.7 g/dL (ref 30.0–36.0)
MCV: 102.1 fL — ABNORMAL HIGH (ref 80.0–100.0)
Monocytes Absolute: 1.3 10*3/uL — ABNORMAL HIGH (ref 0.1–1.0)
Monocytes Relative: 15 %
Neutro Abs: 6.1 10*3/uL (ref 1.7–7.7)
Neutrophils Relative %: 69 %
Platelets: 246 10*3/uL (ref 150–400)
RBC: 3.83 MIL/uL — ABNORMAL LOW (ref 4.22–5.81)
RDW: 13 % (ref 11.5–15.5)
WBC: 8.7 10*3/uL (ref 4.0–10.5)
nRBC: 0 % (ref 0.0–0.2)

## 2020-05-18 LAB — COMPREHENSIVE METABOLIC PANEL
ALT: 17 U/L (ref 0–44)
AST: 21 U/L (ref 15–41)
Albumin: 3.6 g/dL (ref 3.5–5.0)
Alkaline Phosphatase: 62 U/L (ref 38–126)
Anion gap: 10 (ref 5–15)
BUN: 19 mg/dL (ref 8–23)
CO2: 26 mmol/L (ref 22–32)
Calcium: 8.8 mg/dL — ABNORMAL LOW (ref 8.9–10.3)
Chloride: 99 mmol/L (ref 98–111)
Creatinine, Ser: 1 mg/dL (ref 0.61–1.24)
GFR, Estimated: >60 mL/min (ref >60)
Glucose, Bld: 108 mg/dL — ABNORMAL HIGH (ref 70–99)
Potassium: 3.9 mmol/L (ref 3.5–5.1)
Sodium: 135 mmol/L (ref 135–145)
Total Bilirubin: 0.8 mg/dL (ref 0.3–1.2)
Total Protein: 6.8 g/dL (ref 6.5–8.1)

## 2020-05-18 LAB — BLOOD GAS, VENOUS
Acid-Base Excess: 3.8 mmol/L — ABNORMAL HIGH (ref 0.0–2.0)
Bicarbonate: 29.6 mmol/L — ABNORMAL HIGH (ref 20.0–28.0)
FIO2: 21
O2 Saturation: 26.7 %
Patient temperature: 98.6
pCO2, Ven: 52.7 mmHg (ref 44.0–60.0)
pH, Ven: 7.369 (ref 7.250–7.430)
pO2, Ven: <31 mmHg — CL (ref 32.0–45.0)

## 2020-05-18 LAB — URINALYSIS, ROUTINE W REFLEX MICROSCOPIC
Bacteria, UA: NONE SEEN
Bilirubin Urine: NEGATIVE
Glucose, UA: NEGATIVE mg/dL
Ketones, ur: NEGATIVE mg/dL
Leukocytes,Ua: NEGATIVE
Nitrite: NEGATIVE
Protein, ur: 30 mg/dL — AB
Specific Gravity, Urine: 1.024 (ref 1.005–1.030)
pH: 5 (ref 5.0–8.0)

## 2020-05-18 LAB — RESPIRATORY PANEL BY RT PCR (FLU A&B, COVID)
Influenza A by PCR: NEGATIVE
Influenza B by PCR: NEGATIVE
SARS Coronavirus 2 by RT PCR: NEGATIVE

## 2020-05-18 LAB — LACTIC ACID, PLASMA: Lactic Acid, Venous: 0.8 mmol/L (ref 0.5–1.9)

## 2020-05-18 LAB — BRAIN NATRIURETIC PEPTIDE: B Natriuretic Peptide: 279.4 pg/mL — ABNORMAL HIGH (ref 0.0–100.0)

## 2020-05-18 LAB — TSH: TSH: 1.735 u[IU]/mL (ref 0.350–4.500)

## 2020-05-18 MED ORDER — LORAZEPAM 2 MG/ML IJ SOLN
0.5000 mg | INTRAMUSCULAR | Status: DC | PRN
  Administered 2020-05-19 – 2020-05-20 (×4): 0.5 mg via INTRAVENOUS
  Filled 2020-05-18 (×4): qty 1

## 2020-05-18 MED ORDER — ACETAMINOPHEN 325 MG PO TABS: 650.0000 mg | ORAL_TABLET | Freq: Four times a day (QID) | ORAL | Status: DC | PRN

## 2020-05-18 MED ORDER — LEVOTHYROXINE SODIUM 25 MCG PO TABS
25.0000 ug | ORAL_TABLET | Freq: Every day | ORAL | Status: DC
  Administered 2020-05-19 – 2020-05-20 (×2): 25 ug via ORAL
  Filled 2020-05-18 (×2): qty 1

## 2020-05-18 MED ORDER — ACETAMINOPHEN 650 MG RE SUPP
650.0000 mg | Freq: Four times a day (QID) | RECTAL | Status: DC | PRN
  Administered 2020-05-20 – 2020-05-24 (×3): 650 mg via RECTAL
  Filled 2020-05-18 (×3): qty 1

## 2020-05-18 MED ORDER — APIXABAN 5 MG PO TABS
5.0000 mg | ORAL_TABLET | Freq: Two times a day (BID) | ORAL | Status: DC
  Administered 2020-05-18 – 2020-05-20 (×3): 5 mg via ORAL
  Filled 2020-05-18 (×5): qty 1

## 2020-05-18 MED ORDER — FUROSEMIDE 10 MG/ML IJ SOLN
40.0000 mg | Freq: Two times a day (BID) | INTRAMUSCULAR | Status: DC
  Administered 2020-05-18 – 2020-05-19 (×2): 40 mg via INTRAVENOUS
  Filled 2020-05-18 (×2): qty 4

## 2020-05-18 MED ORDER — DONEPEZIL HCL 5 MG PO TABS
5.0000 mg | ORAL_TABLET | Freq: Every day | ORAL | Status: DC
  Administered 2020-05-18: 5 mg via ORAL
  Filled 2020-05-18 (×2): qty 1

## 2020-05-18 MED ORDER — LOSARTAN POTASSIUM 25 MG PO TABS
25.0000 mg | ORAL_TABLET | Freq: Every day | ORAL | Status: DC
  Administered 2020-05-18 – 2020-05-19 (×2): 25 mg via ORAL
  Filled 2020-05-18 (×2): qty 1

## 2020-05-18 MED ORDER — VANCOMYCIN HCL 1500 MG/300ML IV SOLN
1500.0000 mg | Freq: Once | INTRAVENOUS | Status: AC
  Administered 2020-05-18: 1500 mg via INTRAVENOUS
  Filled 2020-05-18: qty 300

## 2020-05-18 MED ORDER — SODIUM CHLORIDE 0.9 % IV SOLN: 250.0000 mL | INTRAVENOUS | Status: DC | PRN

## 2020-05-18 MED ORDER — SODIUM CHLORIDE 0.9 % IV SOLN
2.0000 g | INTRAVENOUS | Status: DC
  Administered 2020-05-19 – 2020-05-20 (×2): 2 g via INTRAVENOUS
  Filled 2020-05-18 (×2): qty 2

## 2020-05-18 MED ORDER — SODIUM CHLORIDE 0.9% FLUSH
3.0000 mL | Freq: Two times a day (BID) | INTRAVENOUS | Status: DC
  Administered 2020-05-18 – 2020-05-24 (×11): 3 mL via INTRAVENOUS

## 2020-05-18 MED ORDER — SERTRALINE HCL 50 MG PO TABS
50.0000 mg | ORAL_TABLET | Freq: Every day | ORAL | Status: DC
  Administered 2020-05-18 – 2020-05-20 (×3): 50 mg via ORAL
  Filled 2020-05-18 (×4): qty 1

## 2020-05-18 MED ORDER — SODIUM CHLORIDE 0.9 % IV SOLN
1.0000 g | Freq: Once | INTRAVENOUS | Status: AC
  Administered 2020-05-18: 1 g via INTRAVENOUS
  Filled 2020-05-18: qty 1

## 2020-05-18 MED ORDER — PANTOPRAZOLE SODIUM 40 MG PO TBEC
40.0000 mg | DELAYED_RELEASE_TABLET | Freq: Every day | ORAL | Status: DC
  Administered 2020-05-18 – 2020-05-20 (×3): 40 mg via ORAL
  Filled 2020-05-18 (×3): qty 1

## 2020-05-18 MED ORDER — AZITHROMYCIN 250 MG PO TABS
500.0000 mg | ORAL_TABLET | Freq: Every day | ORAL | Status: DC
  Administered 2020-05-18 – 2020-05-20 (×3): 500 mg via ORAL
  Filled 2020-05-18 (×3): qty 2

## 2020-05-18 MED ORDER — INSULIN ASPART 100 UNIT/ML ~~LOC~~ SOLN
0.0000 [IU] | Freq: Three times a day (TID) | SUBCUTANEOUS | Status: DC
  Administered 2020-05-19: 2 [IU] via SUBCUTANEOUS
  Administered 2020-05-19: 3 [IU] via SUBCUTANEOUS
  Administered 2020-05-20 (×3): 2 [IU] via SUBCUTANEOUS
  Filled 2020-05-18: qty 0.15

## 2020-05-18 MED ORDER — ROPINIROLE HCL 1 MG PO TABS
2.0000 mg | ORAL_TABLET | Freq: Every day | ORAL | Status: DC
  Administered 2020-05-18: 2 mg via ORAL
  Filled 2020-05-18 (×4): qty 2

## 2020-05-18 MED ORDER — SODIUM CHLORIDE 0.9% FLUSH: 3.0000 mL | INTRAVENOUS | Status: DC | PRN

## 2020-05-18 NOTE — H&P (Signed)
History and Physical    Travis Bradford TDH:[id number] DOB: [birth year] DOA: 05/09/2020  PCP: Patient, No Pcp Per (Confirm with patient/family/NH records and if not entered, this has to be entered at [facility] point of entry) Patient coming from: home  I have personally briefly reviewed patient's old medical records in [place]  Chief Complaint: altered mental status, precipitous functional decline   HPI: Travis Bradford is a 86 y.o. male with medical history significant of abnormal stress test in 2014 (managed conservatively), permanent atrial fibrillation, BPH, HLD, hypothyroidism, lymphoma, prostate CA, PVCs, intermittent RBBB, sleep apnea (pt adamantly declines cpap), bradycardia, mildly dilated root/ascending aorta 2018, mild cardiomyopathy in 2017 (normal EF 2018),non-Hodgkin's lymphoma 20 years ago status post chemotherapy and radiation, carotid disease, traumatic hematoma of the thigh in 09/2019. He was recently admitted by [facility] 10-16/10-19/21 after a fall and was found to be in chronic a. Fib with slow ventricular response. His medications were adjusted - CCB stopped, repinirole decreased and he was able to be discharged home. During his hospital stay he did exhibit agitation in a setting of baseline dementia.  Since discharge home his son reports rapid decline: loss of appetite, decreased general function, increased somnolence. There have been no falls, no injuries, no changes in the home environment. Mr Travis Bradford has always been irrascible and difficult. He has refused to establish with a PCP, he has refused CPAP for severe OSA. The son raised the question of rapidly progressive dementia along with possible depression.  Due to his rapid decline he presents to [facility] for evaluation.    ED Course: Afebrile, 149/63  HR 85 (low of 59) RR 17. EDP PE basically unremarkable for acute findings. Lab reveals nl Cmet, nl CBC and diff. Lactic acid 0.8. TSH 1.735. BNP 279. CXR readout as   Central vascular congestion c/w mild CHF and early infiltrate apex right lung. Patient was administered Cefipime and Vancomycin in the ED to cover for HAP w/o sepsis. TRH called to admit patient for further evaluation and treatment.   Review of Systems: As per HPI otherwise 10 point review of systems negative.    Past Medical History:  Diagnosis Date  . Abnormal stress test    a. 07/2012: abnormal stress echo showing hypokinesis of the mid-distal inferior wall suggestive of ischemia; + frequent PVCs, no ST changes or CP. Dr. [name] had a long discussion with the patient about further w/u versus medical therapy and the patient elected medical therapy..  . Arthritis   . Atrial fibrillation ([place])    a. Dx 01/2016.  [name] BPH (benign prostatic hyperplasia)   . Bradycardia   . Dizziness 05/11/2020  . History of blood transfusion   . Hyperlipidemia   . Hypothyroid   . Lymphoma ([place])    15 years ago  . Pneumonia   . Prostate cancer ([place])   . PVC's (premature ventricular contractions)    a. 07/2012 - seen on stress echo.  [name] RBBB    a. h/o intermittent rbbb.  . Restless legs syndrome   . Sinus bradycardia   . Sleep apnea    does not use a cpap    Past Surgical History:  Procedure Laterality Date  . APPENDECTOMY    . Athroscopic knee surgery    . COLONOSCOPY    . dental implants    . GREEN LIGHT LASER TURP (TRANSURETHRAL RESECTION OF PROSTATE  08/29/2012   Procedure: GREEN LIGHT LASER TURP (TRANSURETHRAL RESECTION OF PROSTATE;  Surgeon: [name], MD;  Location:  [facility];  Service: Urology;  Laterality: N/A;  . HEMATOMA EVACUATION Right 10/11/2019   Procedure: RIGHT LEG EVACUATION OF DEEP HEMATOMA;  Surgeon: [name], MD;  Location: [facility];  Service: Orthopedics;  Laterality: Right;  . MOHS SURGERY     x 6   Soc Hx -  Left home before graduating HS. Spent several years in the [employer]. 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 He married and  had 2 sons. He was widowed about 20 years ago. He remained very active, enjoying high risk activities. Currently he lives with his son. There are helpers in the home to assist with cooking and ADLs.    reports that he has quit smoking. He has never used smokeless tobacco. He reports previous alcohol use. He reports that he does not use drugs.  Allergies  Allergen Reactions  . Tape Other (See Comments)    THE PATIENT'S SKIN IS VERY THIN- TEARS AND BRUISES EASILY!!!!    Family History  Problem Relation Age of Onset  . CAD Mother   . Stroke Brother         X 2     Prior to Admission medications   Medication Sig Start Date End Date Taking? Authorizing Provider  apixaban (ELIQUIS) 5 MG TABS tablet Take 5 mg by mouth 2 (two) times daily.    [provider]  Cholecalciferol (D3-50) 1.25 MG (50000 UT) capsule Take 50,000 Units by mouth every Monday.    [provider]  levothyroxine (SYNTHROID) 25 MCG tablet Take 25 mcg by mouth daily.     [provider]  rOPINIRole (REQUIP) 2 MG tablet Take 1 tablet (2 mg total) by mouth at bedtime. 05/14/20   [name], PA-C    Physical Exam: Vitals:   05/24/2020 1600 05/24/2020 1615 05/24/2020 1630 05/26/2020 1925  BP: 125/70 110/70 107/61 (!) 149/63  Pulse: 62 64 65 85  Resp: (!) 32 (!) 33 (!) 25 17  SpO2: 95% 100% 94% 98%     Vitals:   04/27/2020 1600 05/04/2020 1615 05/11/2020 1630 05/20/2020 1925  BP: 125/70 110/70 107/61 (!) 149/63  Pulse: 62 64 65 85  Resp: (!) 32 (!) 33 (!) 25 17  SpO2: 95% 100% 94% 98%   General -  Large man in no distress but confused. Eyes: PERRL, lids and conjunctivae normal ENMT: Mucous membranes are moist. Posterior pharynx clear of any exudate or lesions.Normal dentition.  Neck: normal, supple, no masses, no thyromegaly Respiratory: (suboptimal exam - patient mildly agitated) Decreased BS, no rales, no wheezes, no rhonchi. No increased WOB.  Cardiovascular: IRIR, no murmurs / rubs / gallops.  No extremity edema. 2+ pedal pulses. No carotid bruits.  Abdomen: Protuberant, hypoactive to absent BS. No tenderness to palpation, no guarding or rebound. No HSM - exam limited by girth Musculoskeletal: no clubbing / cyanosis. No joint deformity upper and lower extremities. Good ROM, no contractures. Normal muscle tone.  Skin: no rashes, lesions, ulcers. No induration Neurologic: CN 2-12 grossly intact. Sensation intact. Strength 5/5 in all 4.  Psychiatric: Poor insight to medical condition or why he is being admitted. He is awake, conversant, mildly agitated.     Labs on Admission: I have personally reviewed following labs and imaging studies  CBC: Recent Labs  Lab 05/12/20 1757 05/13/20 0828 05/14/20 0714 05/05/2020 1518  WBC 10.5 10.1 10.4 8.7  NEUTROABS  --   --   --  6.1  HGB 13.0 12.8* 12.7* 12.4*  HCT 40.1 38.2* 39.1 39.1  MCV 101.5* 99.5 99.7 102.1*  PLT 297 265 285 657   Basic Metabolic Panel: Recent Labs  Lab 05/12/20 1757 05/13/20 0828 05/14/20 0714 05/05/2020 1518  NA 138 136 139 135  K 4.6 4.0 4.2 3.9  CL 104 103 103 99  CO2 25 25 29 26   GLUCOSE 150* 206* 139* 108*  BUN 30* 24* 22 19  CREATININE 1.29* 1.23 1.00 1.00  CALCIUM 9.7 9.3 9.5 8.8*   GFR: Estimated Creatinine Clearance: 53.6 mL/min (by C-G formula based on SCr of 1 mg/dL). Liver Function Tests: Recent Labs  Lab 05/23/2020 1518  AST 21  ALT 17  ALKPHOS 62  BILITOT 0.8  PROT 6.8  ALBUMIN 3.6   No results for input(s): LIPASE, AMYLASE in the last 168 hours. No results for input(s): AMMONIA in the last 168 hours. Coagulation Profile: No results for input(s): INR, PROTIME in the last 168 hours. Cardiac Enzymes: No results for input(s): CKTOTAL, CKMB, CKMBINDEX, TROPONINI in the last 168 hours. BNP (last 3 results) No results for input(s): PROBNP in the last 8760 hours. HbA1C: No results for input(s): HGBA1C in the last 72 hours. CBG: No results for input(s): GLUCAP in the last 168  hours. Lipid Profile: No results for input(s): CHOL, HDL, LDLCALC, TRIG, CHOLHDL, LDLDIRECT in the last 72 hours. Thyroid Function Tests: Recent Labs    05/16/2020 1518  TSH 1.735   Anemia Panel: No results for input(s): VITAMINB12, FOLATE, FERRITIN, TIBC, IRON, RETICCTPCT in the last 72 hours. Urine analysis:    Component Value Date/Time   COLORURINE YELLOW 05/08/2020 [place] 05/05/2020 1717   LABSPEC 1.024 05/22/2020 1717   PHURINE 5.0 05/06/2020 1717   GLUCOSEU NEGATIVE 05/02/2020 1717   HGBUR SMALL (A) 05/19/2020 1717   BILIRUBINUR NEGATIVE 05/14/2020 1717   BILIRUBINUR neg 12/13/2012 1650   KETONESUR NEGATIVE 05/09/2020 1717   PROTEINUR 30 (A) 05/05/2020 1717   UROBILINOGEN 0.2 12/20/2013 1553   NITRITE NEGATIVE 05/07/2020 1717   LEUKOCYTESUR NEGATIVE 05/12/2020 1717    Radiological Exams on Admission: CT Head Wo Contrast  Result Date: 05/05/2020 CLINICAL DATA:  Altered mental status after fall 1 week ago EXAM: CT HEAD WITHOUT CONTRAST TECHNIQUE: Contiguous axial images were obtained from the base of the skull through the vertex without intravenous contrast. COMPARISON:  05/11/2020 FINDINGS: Brain: No evidence of acute infarction, hemorrhage, hydrocephalus, extra-axial collection or mass lesion/mass effect. Mild low-density changes within the periventricular and subcortical white matter compatible with chronic microvascular ischemic change. Moderate diffuse cerebral volume loss. Vascular: Atherosclerotic calcifications involving the large vessels of the skull base. No unexpected hyperdense vessel. Skull: Normal. Negative for fracture or focal lesion. Sinuses/Orbits: Unchanged including probable retention cyst in the left sphenoid sinus. Mastoid air cells are clear. Orbital structures unremarkable. Other: None. IMPRESSION: 1. No acute intracranial findings.  Stable exam from 05/11/2020 2. Chronic microvascular ischemic change and cerebral volume loss.  Electronically Signed   By: Davina Poke D.O.   On: 05/15/2020 18:59   DG Chest Portable 1 View  Result Date: 05/02/2020 CLINICAL DATA:  Altered mental status and chills for 2 days EXAM: PORTABLE CHEST 1 VIEW COMPARISON:  02/06/2016 FINDINGS: Cardiac shadow is enlarged. Increased central vascular congestion is noted. Patchy increased density is noted in the right apex consistent with early infiltrate. Mild interstitial edema is noted. No bony abnormality is noted. IMPRESSION: Early infiltrate in the right apex. Mild CHF. Electronically Signed   By: Linus Mako.D.  On: 05/17/2020 15:49    EKG: Independently reviewed. A. Fib at rate of 73,m RBB, LAFB, LVH - no acute change, no change from previous study  Assessment/Plan Active Problems:   Hyperlipidemia   Diabetes mellitus with renal manifestation (HCC)   Hypothyroidism   Restless leg syndrome   HCAP (healthcare-associated pneumonia)   HAP (hospital-acquired pneumonia)    1. HAP - patient with recent hospitalization now with FTT, altered MS. CXR with early infiltrate RUL. CBC nl. No hypoxemia Plan Sputum culture, Nasal swab for MRSA  Ceftriaxone 2 g q 24, Azithromycin 500 mg daily  2. CHF - last echo Sept 2018 with LVEF 55-60%. No prior h/o CHF. CXR with vascular congestion and mild elevation in BNP. Plan Tele admit  Diuretic - lasic 40 mg q12 x 3 doses  ARB - losartan 25 mg daily  2D echo  F/u BNP and CXR  3. DM - diet controlled. Last A1C 7.4% 05/14/20 Plan Sliding scale coverage  4. Hypothyroidism - stable on present medication  5.  Dementia - long standing problem with some progression. Question of sudden decline. No hx to suggest acute neurologic event. Nl Head CT Plan Aricept 5 mg daily  6. Depression - per son the patient has become more listless and disengaged. He has expressed unhappiness about his decline in health and his advancing age. He refuses to see PCP Plan Trial of Zoloft 50 mg daily  7. OSA - per  history he has had several sleep studies confirming OSA with desaturation. Patient has refused to wear CPAP. Plan Nocturnal oxygen  8. GI - on exam - hypoactive to absent BS Plan Abdominal x-ray single view  PPI  9. EoL Care - DNR already established. Goals of Care and MOST decisions not yet addressed. Son, who experienced Hospice when his mother was sick and died, would like palliative care consult.  DVT prophylaxis: Eliquis Code Status: DNR/DNI  Family Communication: spoke at length with son.  Disposition Plan: Home, possibly with palliatice care/hospice  Consults called: Palliative care consult put in   Admission status: inpatient    Adella Hare MD [facility] Pager [phone]  If 7PM-7AM, please contact night-coverage [email or web address] Password [facility]  05/12/2020, 8:32 PM

## 2020-05-18 NOTE — ED Provider Notes (Signed)
[facility] DEPT Provider Note   CSN: [id number] Arrival date & time: 05/03/2020  1247     History Chief Complaint  Patient presents with  . Chills  . Fatigue    Travis Bradford is a 86 y.o. male.  86 year old male with past medical history below including atrial fibrillation on anticoagulation, BPH, hypothyroidism, previous history of lymphoma, OSA not on CPAP hypertension, hyperlipidemia who presents with fatigue and malaise. Pt was admitted to the hospital 10/16-10/19 for slow A fib.  After he was discharged home, son states that he seemed to be improving for a day or 2 but then had a fairly precipitous decline in functioning.  He has been lethargic, falling asleep a lot, generally fatigued, and poorly coordinated while doing things like eating.  He has had no focal unilateral weakness or facial asymmetry.  Son notes that he has had some increased urination and some urinary incontinence recently.  Urine seems darker and does have strong odor.  Patient himself denies any dysuria.  He has not had any cough and he denies any breathing problems.  Denies abdominal pain and son states no vomiting or diarrhea, only mild constipation.  LEVEL 5 CAVEAT DUE TO AMS  The history is provided by a relative.       Past Medical History:  Diagnosis Date  . Abnormal stress test    a. 07/2012: abnormal stress echo showing hypokinesis of the mid-distal inferior wall suggestive of ischemia; + frequent PVCs, no ST changes or CP. Dr. [name] had a long discussion with the patient about further w/u versus medical therapy and the patient elected medical therapy..  . Arthritis   . Atrial fibrillation ([place])    a. Dx 01/2016.  [name] BPH (benign prostatic hyperplasia)   . Bradycardia   . Dizziness 05/11/2020  . History of blood transfusion   . Hyperlipidemia   . Hypothyroid   . Lymphoma ([place])    15 years ago  . Pneumonia   . Prostate cancer ([place])   . PVC's (premature  ventricular contractions)    a. 07/2012 - seen on stress echo.  [name] RBBB    a. h/o intermittent rbbb.  . Restless legs syndrome   . Sinus bradycardia   . Sleep apnea    does not use a cpap    Patient Active Problem List   Diagnosis Date Noted  . Gait instability 05/14/2020  . Elevated blood-pressure reading without diagnosis of hypertension 05/14/2020  . Sleep apnea 05/14/2020  . Atrial fibrillation with slow ventricular response ([place]) 05/11/2020  . Dizziness 05/11/2020  . Fall 05/11/2020  . Traumatic hematoma of right thigh 10/11/2019  . Periodic limb movement sleep disorder 03/26/2016  . Snoring 02/06/2016  . Daytime hypersomnolence 02/06/2016  . Periodic limb movement disorder (PLMD) 02/06/2016  . Hx of recurrent pneumonia 02/06/2016  . HCAP (healthcare-associated pneumonia) 12/20/2013  . UTI (lower urinary tract infection) 12/20/2013  . Nonspecific abnormal electrocardiogram (ECG) (EKG) 12/10/2013  . Leukocytosis 12/09/2013  . Sinus tachycardia 12/09/2013  . Respiratory distress 12/09/2013  . Hypotension 12/09/2013  . Septic shock ([place]) 12/09/2013  . Hypothyroidism 12/09/2013  . Restless leg syndrome 12/09/2013  . Arthritis   . CAP (community acquired pneumonia) 12/08/2013  . Diabetes mellitus with renal manifestation ([place]) 12/14/2012  . Anemia 12/14/2012  . Nonspecific abnormal unspecified cardiovascular function study 10/18/2012  . Bradycardia 07/13/2012  . Hyperlipidemia 07/13/2012    Past Surgical History:  Procedure Laterality Date  . APPENDECTOMY    .  Athroscopic knee surgery    . COLONOSCOPY    . dental implants    . GREEN LIGHT LASER TURP (TRANSURETHRAL RESECTION OF PROSTATE  08/29/2012   Procedure: GREEN LIGHT LASER TURP (TRANSURETHRAL RESECTION OF PROSTATE;  Surgeon: [name], MD;  [facility];  Service: Urology;  Laterality: N/A;  . HEMATOMA EVACUATION Right 10/11/2019   Procedure: RIGHT LEG EVACUATION OF DEEP HEMATOMA;  Surgeon: [name], MD;  Location: [facility];  Service: Orthopedics;  Laterality: Right;  . MOHS SURGERY     x 6       Family History  Problem Relation Age of Onset  . CAD Mother   . Stroke Brother         X 2    Social History   Tobacco Use  . Smoking status: Former Research scientist (life sciences)  . Smokeless tobacco: Never Used  Vaping Use  . Vaping Use: Never used  Substance Use Topics  . Alcohol use: Not Currently    Comment: Rarely  . Drug use: Never    Home Medications Prior to Admission medications   Medication Sig Start Date End Date Taking? Authorizing Provider  apixaban (ELIQUIS) 5 MG TABS tablet Take 5 mg by mouth 2 (two) times daily.    [provider]  Cholecalciferol (D3-50) 1.25 MG (50000 UT) capsule Take 50,000 Units by mouth every Monday.    [provider]  levothyroxine (SYNTHROID) 25 MCG tablet Take 25 mcg by mouth daily.     [provider]  rOPINIRole (REQUIP) 2 MG tablet Take 1 tablet (2 mg total) by mouth at bedtime. 05/14/20   Dunn, Nedra Hai, PA-C    Allergies    Tape  Review of Systems   Review of Systems  Unable to perform ROS: Mental status change    Physical Exam Updated Vital Signs There were no vitals taken for this visit.  Physical Exam Vitals and nursing note reviewed.  Constitutional:      General: He is not in acute distress.    Appearance: He is well-developed.     Comments: Eyes closed but awake  HENT:     Head: Normocephalic and atraumatic.  Eyes:     Conjunctiva/sclera: Conjunctivae normal.  Cardiovascular:     Rate and Rhythm: Normal rate. Rhythm irregular.     Heart sounds: Normal heart sounds. No murmur heard.   Pulmonary:     Effort: Pulmonary effort is normal.     Breath sounds: Normal breath sounds.  Abdominal:     General: Bowel sounds are normal. There is no distension.     Palpations: Abdomen is soft.     Tenderness: There is no abdominal tenderness.  Musculoskeletal:     Cervical back: Neck supple.     Right lower  leg: No edema.     Left lower leg: No edema.  Skin:    General: Skin is warm and dry.  Neurological:     Comments: Fluent speech, oriented to person and place, no facial asymmetry     ED Results / Procedures / Treatments   Labs (all labs ordered are listed, but only abnormal results are displayed) Labs Reviewed  URINALYSIS, ROUTINE W REFLEX MICROSCOPIC - Abnormal; Notable for the following components:      Result Value   Hgb urine dipstick SMALL (*)    Protein, ur 30 (*)    All other components within normal limits  COMPREHENSIVE METABOLIC PANEL - Abnormal; Notable for the following components:  Glucose, Bld 108 (*)    Calcium 8.8 (*)    All other components within normal limits  CBC WITH DIFFERENTIAL/PLATELET - Abnormal; Notable for the following components:   RBC 3.83 (*)    Hemoglobin 12.4 (*)    MCV 102.1 (*)    Monocytes Absolute 1.3 (*)    All other components within normal limits  BLOOD GAS, VENOUS - Abnormal; Notable for the following components:   pO2, Ven <31.0 (*)    Bicarbonate 29.6 (*)    Acid-Base Excess 3.8 (*)    All other components within normal limits  BRAIN NATRIURETIC PEPTIDE - Abnormal; Notable for the following components:   B Natriuretic Peptide 279.4 (*)    All other components within normal limits  RESPIRATORY PANEL BY RT PCR (FLU A&B, COVID)  URINE CULTURE  CULTURE, BLOOD (ROUTINE X 2)  CULTURE, BLOOD (ROUTINE X 2)  MRSA PCR SCREENING  LACTIC ACID, PLASMA  TSH  INFLUENZA PANEL BY PCR (TYPE A & B)  BASIC METABOLIC PANEL    EKG EKG Interpretation  Date/Time:  Saturday May 18 2020 15:26:16 EDT Ventricular Rate:  73 PR Interval:    QRS Duration: 164 QT Interval:  477 QTC Calculation: 526 R Axis:   -76 Text Interpretation: Atrial fibrillation RBBB and LAFB Left ventricular hypertrophy similar to previous Confirmed by Theotis Burrow [phone]) on 05/10/2020 4:32:33 PM   Radiology DG Abd 1 View  Result Date: 05/22/2020 CLINICAL  DATA:  Chills and malaise starting yesterday. EXAM: ABDOMEN - 1 VIEW COMPARISON:  Chest 05/06/2020 FINDINGS: Gas and stool throughout the colon without distention. Mid abdominal small bowel are prominent with some dilated gas-filled small bowel loops in the left mid abdomen. Findings may represent early small bowel obstruction. Ileus would be a another possibility. Calcifications in the right upper quadrant consistent with gallstones. Cardiac enlargement. Degenerative changes in the spine and hips. IMPRESSION: Prominent mid abdominal small bowel with some dilated gas-filled small bowel loops in the left mid abdomen. Findings may represent early small bowel obstruction versus ileus. Electronically Signed   By: Lucienne Capers M.D.   On: 05/20/2020 21:44   CT Head Wo Contrast  Result Date: 05/08/2020 CLINICAL DATA:  Altered mental status after fall 1 week ago EXAM: CT HEAD WITHOUT CONTRAST TECHNIQUE: Contiguous axial images were obtained from the base of the skull through the vertex without intravenous contrast. COMPARISON:  05/11/2020 FINDINGS: Brain: No evidence of acute infarction, hemorrhage, hydrocephalus, extra-axial collection or mass lesion/mass effect. Mild low-density changes within the periventricular and subcortical white matter compatible with chronic microvascular ischemic change. Moderate diffuse cerebral volume loss. Vascular: Atherosclerotic calcifications involving the large vessels of the skull base. No unexpected hyperdense vessel. Skull: Normal. Negative for fracture or focal lesion. Sinuses/Orbits: Unchanged including probable retention cyst in the left sphenoid sinus. Mastoid air cells are clear. Orbital structures unremarkable. Other: None. IMPRESSION: 1. No acute intracranial findings.  Stable exam from 05/11/2020 2. Chronic microvascular ischemic change and cerebral volume loss. Electronically Signed   By: Davina Poke D.O.   On: 05/15/2020 18:59   DG Chest Portable 1  View  Result Date: 05/16/2020 CLINICAL DATA:  Altered mental status and chills for 2 days EXAM: PORTABLE CHEST 1 VIEW COMPARISON:  02/06/2016 FINDINGS: Cardiac shadow is enlarged. Increased central vascular congestion is noted. Patchy increased density is noted in the right apex consistent with early infiltrate. Mild interstitial edema is noted. No bony abnormality is noted. IMPRESSION: Early infiltrate in the right apex. Mild CHF.  Electronically Signed   By: Inez Catalina M.D.   On: 05/13/2020 15:49    Procedures Procedures (including critical care time)  Medications Ordered in ED Medications - No data to display  ED Course  I have reviewed the triage vital signs and the nursing notes.  Pertinent labs & imaging results that were available during my care of the patient were reviewed by me and considered in my medical decision making (see chart for details).    MDM Rules/Calculators/A&P                          Patient was nontoxic on exam, reassuring vital signs.  No focal complaints of pain.  Lab work notable for UA without infection, COVID-19 negative, no hypercarbia on VBG, normal WBC, normal TSH, normal lactate. Head CT negative acute. CXR w/ R apical infiltrate. Given recent hospitalization, will cover for HCAP w/ broad abx. Discussed admission w/ [facility], Dr. Linda Hedges. Son in agreement w/ plan.  Final Clinical Impression(s) / ED Diagnoses Final diagnoses:  HCAP (healthcare-associated pneumonia)  Fatigue, unspecified type  Poor appetite    Rx / DC Orders ED Discharge Orders    None       Jewell Ryans, Wenda Overland, MD 05/22/2020 2321

## 2020-05-18 NOTE — ED Triage Notes (Signed)
BIB EMS with c/o chills today and malaise starting yesterday. Pt is from home. Has sleep apnea, frequent urination with strong odor since yesterday. 134/64-66-18-97%-CBG 144 Pt rhythm A-Fib has history.

## 2020-05-18 NOTE — Telephone Encounter (Signed)
86 year old male with permanent atrial fibrillation, BPH, lymphoma, prostate CA, sleep apnea. He was recently admitted and just discharged 05/14/2020 with atrial fibrillation with slow ventricular response. His heart rate was reportedly in the 20s at times. He was treated with calcium gluconate and his diltiazem was stopped. His ropinirole was also decreased.  His son, [name] (DPR on file), called the answering service today due to worsening lethargy. The patient seemed to be in an improved state when he left the hospital. However, over the past couple of days, he has developed recurrent lethargy. He is hard to arouse. He has not passed out. He has not complained of chest pain or shortness of breath. He has mild pedal edema. His vital signs seem to be good. His oxygen is 95%, heart rate in the 50s, blood pressure 128/76. Of note, his son describes Cheyne-Stokes breathing.  PLAN: Given his current symptoms, I recommend he go to the emergency room for further evaluation. He may have underlying complete heart block and now requires pacemaker as he is off of AV nodal blocking agents. He may have developed some type of infection and is now septic. His son agrees with this plan. I will forward my note also to Dr. [name] and his nurse to follow-up with the patient on Monday to see if he needs to be seen in the office sooner.  Richardson Dopp, PA-C    05/16/2020 12:04 PM

## 2020-05-18 NOTE — Progress Notes (Signed)
A consult was received from an ED physician for vancomycin per pharmacy dosing.  The patient's profile has been reviewed for ht/wt/allergies/indication/available labs.    A one time order has been placed for vancomycin 1500 mg IV once.    Further antibiotics/pharmacy consults should be ordered by admitting physician if indicated.                       Thank you, Lenis Noon, PharmD 05/07/2020  7:24 PM

## 2020-05-18 NOTE — ED Notes (Signed)
Date and time results received: 04/29/2020 4:32 PM  Test: VBG Po2 Critical Value: <31.0  Name of Provider Notified: Little, EDP

## 2020-05-19 ENCOUNTER — Inpatient Hospital Stay (HOSPITAL_COMMUNITY): Payer: Medicare HMO

## 2020-05-19 ENCOUNTER — Other Ambulatory Visit: Payer: Self-pay

## 2020-05-19 DIAGNOSIS — Z7189 Other specified counseling: Secondary | ICD-10-CM

## 2020-05-19 DIAGNOSIS — Z515 Encounter for palliative care: Secondary | ICD-10-CM | POA: Diagnosis not present

## 2020-05-19 DIAGNOSIS — I5021 Acute systolic (congestive) heart failure: Secondary | ICD-10-CM

## 2020-05-19 DIAGNOSIS — Z66 Do not resuscitate: Secondary | ICD-10-CM

## 2020-05-19 DIAGNOSIS — J189 Pneumonia, unspecified organism: Secondary | ICD-10-CM

## 2020-05-19 LAB — GLUCOSE, CAPILLARY
Glucose-Capillary: 157 mg/dL — ABNORMAL HIGH (ref 70–99)
Glucose-Capillary: 117 mg/dL — ABNORMAL HIGH (ref 70–99)
Glucose-Capillary: 110 mg/dL — ABNORMAL HIGH (ref 70–99)
Glucose-Capillary: 149 mg/dL — ABNORMAL HIGH (ref 70–99)
Glucose-Capillary: 120 mg/dL — ABNORMAL HIGH (ref 70–99)

## 2020-05-19 LAB — ECHOCARDIOGRAM COMPLETE
Area-P 1/2: 3.08 cm2
Height: 67 in
S' Lateral: 3.2 cm
Single Plane A4C EF: 51.8 %
Weight: 3449.76 oz

## 2020-05-19 LAB — BASIC METABOLIC PANEL
Anion gap: 13 (ref 5–15)
BUN: 23 mg/dL (ref 8–23)
CO2: 23 mmol/L (ref 22–32)
Calcium: 8.5 mg/dL — ABNORMAL LOW (ref 8.9–10.3)
Chloride: 96 mmol/L — ABNORMAL LOW (ref 98–111)
Creatinine, Ser: 1.12 mg/dL (ref 0.61–1.24)
GFR, Estimated: >60 mL/min (ref >60)
Glucose, Bld: 170 mg/dL — ABNORMAL HIGH (ref 70–99)
Potassium: 3.7 mmol/L (ref 3.5–5.1)
Sodium: 132 mmol/L — ABNORMAL LOW (ref 135–145)

## 2020-05-19 LAB — BRAIN NATRIURETIC PEPTIDE: B Natriuretic Peptide: 295.9 pg/mL — ABNORMAL HIGH (ref 0.0–100.0)

## 2020-05-19 MED ORDER — SODIUM CHLORIDE 0.9% IV SOLUTION: Freq: Once | INTRAVENOUS | Status: DC

## 2020-05-19 MED ORDER — LORAZEPAM 2 MG/ML IJ SOLN
0.5000 mg | Freq: Once | INTRAMUSCULAR | Status: AC | PRN
  Administered 2020-05-19: 0.5 mg via INTRAVENOUS
  Filled 2020-05-19: qty 1

## 2020-05-19 MED ORDER — INFLUENZA VAC A&B SA ADJ QUAD 0.5 ML IM PRSY
0.5000 mL | PREFILLED_SYRINGE | INTRAMUSCULAR | Status: AC
  Administered 2020-05-20: 0.5 mL via INTRAMUSCULAR
  Filled 2020-05-19: qty 0.5

## 2020-05-19 NOTE — Progress Notes (Signed)
Pt restless, trying to get out of bed and pulling at tubes. Ativan given. Pt unable to follow commands and swallow PO meds. Will continue to monitor.

## 2020-05-19 NOTE — Progress Notes (Signed)
PROGRESS NOTE    Travis Bradford  SVX:[id number] DOB: 19-Mar-1926 DOA: 05/22/2020 PCP: Patient, No Pcp Per   Chief Complaint  Patient presents with  . Chills  . Fatigue    Brief Narrative: Travis Bradford is Travis Bradford 86 y.o. male with medical history significant of abnormal stress test in 2014 (managed conservatively), permanent atrial fibrillation, BPH, HLD, hypothyroidism, lymphoma, prostate CA, PVCs, intermittent RBBB, sleep apnea (ptadamantly declinescpap), bradycardia, mildly dilated root/ascending aorta 2018, mild cardiomyopathy in 2017 (normal EF 2018),non-Hodgkin's lymphoma 20 years ago status post chemotherapy and radiation, carotid disease, traumatic hematoma of the thigh in 09/2019. He was recently admitted by [facility] 10-16/10-19/21 after [name] fall and was found to be in chronic Travis Bradford. Fib with slow ventricular response. His medications were adjusted - CCB stopped, repinirole decreased and he was able to be discharged home. During his hospital stay he did exhibit agitation in Travis Bradford setting of baseline dementia.  Since discharge home his son reports rapid decline: loss of appetite, decreased general function, increased somnolence. There have been no falls, no injuries, no changes in the home environment. Travis Bradford has always been irrascible and difficult. He has refused to establish with Nikelle Malatesta PCP, he has refused CPAP for severe OSA. The son raised the question of rapidly progressive dementia along with possible depression.  Due to his rapid decline he presents to [facility] for evaluation.  Assessment & Plan:   Active Problems:   Hyperlipidemia   Diabetes mellitus with renal manifestation (HCC)   Hypothyroidism   Restless leg syndrome   HCAP (healthcare-associated pneumonia)   HAP (hospital-acquired pneumonia)  HCAP:  CXR 10/24 with RUL pneumonia Continue ceftriaxone, azithromycin Follow blood cx, sputum cx if able Negative flu/covid.   Follow MRSA PCR SLP eval   Ileus: Large BM today,  improved, imaging improved, negative kub today KUB 10/23 with dilated gas filled small bowel loobs, early obstruction vs ileus  Acute Metabolic Encephalopathy  Dementia  Depression Likely 2/2 above and hospitalization Started on aricept Started on zoloft -> follow repeat EKG for qt eval Delirium precautions Low dose prn ativan  Head CT without acute findings  HFpEF Doesn't appear grossly overloaded, elevated BNP Echo 10/24 with EF 55-60%, basal to mid inferior wall hypokinesis (see report), mildly reduced RVSF Recommended repeat limited study with contrast when pt cooperative Hold additional lasix, hold losartan Strict I/O, daily weights  T2DM:  SSI  Hypothyroidism: Continue home meds  OSA He's refused to wear cpap Nocturnal O2, continue to monitor  Goals of Care Palliative care c/s, appreciate recs  DVT prophylaxis: continue eliquis Code Status: DNR Family Communication: none at bedside Disposition:   Status is: Inpatient  Remains inpatient appropriate because:Inpatient level of care appropriate due to severity of illness   Dispo: The patient is from: Home              Anticipated d/c is to: pending              Anticipated d/c date is: > 3 days              Patient currently is not medically stable to d/c.  Consultants:   Palliative care  Procedures:  echo IMPRESSIONS    1. The basal to mid inferior wall appears hypokinetic. EF ~55-60% which  is largely unchanged from prior study. The windows were quite poor on this  study, and the patient was non-compliant with the exam as documented by  the sonographer. Would recommend  to repeat Nevae Pinnix limited study with  contrast when the patient is cooperative.  Left ventricular ejection fraction, by estimation, is 55 to 60%. The left  ventricle has normal function. The left ventricle demonstrates regional  wall motion abnormalities (see  scoring diagram/findings for description). Left ventricular diastolic   function could not be evaluated.  2. Right ventricular systolic function is mildly reduced. The right  ventricular size is moderately enlarged. There is normal pulmonary artery  systolic pressure. The estimated right ventricular systolic pressure is  28.3 mmHg.  3. Left atrial size was mild to moderately dilated.  4. Right atrial size was mildly dilated.  5. The mitral valve is degenerative. No evidence of mitral valve  regurgitation. No evidence of mitral stenosis.  6. The aortic valve is calcified. Aortic valve regurgitation is not  visualized. Mild to moderate aortic valve sclerosis/calcification is  present, without any evidence of aortic stenosis.  7. The inferior vena cava is normal in size with <50% respiratory  variability, suggesting right atrial pressure of 8 mmHg.   FINDINGS  Left Ventricle: The basal to mid inferior wall appears hypokinetic. EF  ~55-60% which is largely unchanged from prior study. The windows were  quite poor on this study, and the patient was non-compliant with the exam  as documented by the sonographer.  Would recommend to repeat Neisha Hinger limited study with contrast when the patient  is cooperative. Left ventricular ejection fraction, by estimation, is 55  to 60%. The left ventricle has normal function. The left ventricle  demonstrates regional wall motion  abnormalities. The left ventricular internal cavity size was normal in  size. There is no left ventricular hypertrophy. Abnormal (paradoxical)  septal motion, consistent with left bundle branch block. Left ventricular  diastolic function could not be  evaluated due to atrial fibrillation. Left ventricular diastolic function  could not be evaluated.   Antimicrobials: Anti-infectives (From admission, onward)   Start     Dose/Rate Route Frequency Ordered Stop   05/19/20 0600  cefTRIAXone (ROCEPHIN) 2 g in sodium chloride 0.9 % 100 mL IVPB        2 g 200 mL/hr over 30 Minutes Intravenous Every 24  hours 05/07/2020 2029 05/24/20 0559   05/19/2020 2130  azithromycin (ZITHROMAX) tablet 500 mg        500 mg Oral Daily 05/03/2020 2029 05/23/20 0959   05/17/2020 1915  vancomycin (VANCOREADY) IVPB 1500 mg/300 mL        1,500 mg 150 mL/hr over 120 Minutes Intravenous  Once 05/23/2020 1902 05/06/2020 2238   05/04/2020 1830  ceFEPIme (MAXIPIME) 1 g in sodium chloride 0.9 % 100 mL IVPB        1 g 200 mL/hr over 30 Minutes Intravenous  Once 05/24/2020 1825 04/28/2020 1928      Subjective: Lethargic, doesn't say much   Objective: Vitals:   05/24/2020 2217 05/19/20 0053 05/19/20 0300 05/19/20 1302  BP: (!) 156/71  124/70 126/64  Pulse: 67   79  Resp: 20   19  Temp: 99.4 F (37.4 C)   99.1 F (37.3 C)  TempSrc: Oral   Oral  SpO2: 96%   93%  Weight:  97.8 kg    Height:  5\' 7"  (1.702 m)      Intake/Output Summary (Last 24 hours) at 05/19/2020 1421 Last data filed at 05/19/2020 1300 Gross per 24 hour  Intake 178.15 ml  Output 1663 ml  Net -1484.85 ml   Filed Weights   05/19/20 0053  Weight: 97.8 kg    Examination:  General exam: Appears calm and comfortable  Respiratory system: unlabored Cardiovascular system: S1 & S2 heard, RRR Gastrointestinal system: Abdomen is nondistended, soft and nontender.  Central nervous system: lethargic, moving all extremities, confused Extremities: no LEE Skin: No rashes, lesions or ulcers  Data Reviewed: I have personally reviewed following labs and imaging studies  CBC: Recent Labs  Lab 05/12/20 1757 05/13/20 0828 05/14/20 0714 04/26/2020 1518  WBC 10.5 10.1 10.4 8.7  NEUTROABS  --   --   --  6.1  HGB 13.0 12.8* 12.7* 12.4*  HCT 40.1 38.2* 39.1 39.1  MCV 101.5* 99.5 99.7 102.1*  PLT 297 265 285 893    Basic Metabolic Panel: Recent Labs  Lab 05/12/20 1757 05/13/20 0828 05/14/20 0714 05/01/2020 1518 05/19/20 0511  NA 138 136 139 135 132*  K 4.6 4.0 4.2 3.9 3.7  CL 104 103 103 99 96*  CO2 25 25 29 26 23   GLUCOSE 150* 206* 139* 108* 170*  BUN  30* 24* 22 19 23   CREATININE 1.29* 1.23 1.00 1.00 1.12  CALCIUM 9.7 9.3 9.5 8.8* 8.5*    GFR: Estimated Creatinine Clearance: 47.9 mL/min (by C-G formula based on SCr of 1.12 mg/dL).  Liver Function Tests: Recent Labs  Lab 05/16/2020 1518  AST 21  ALT 17  ALKPHOS 62  BILITOT 0.8  PROT 6.8  ALBUMIN 3.6    CBG: Recent Labs  Lab 05/19/20 0758 05/19/20 1213 05/19/20 1315  GLUCAP 157* 117* 110*     Recent Results (from the past 240 hour(s))  Resp Panel by RT PCR (RSV, Flu Rylen Hou&B, Covid) - Nasopharyngeal Swab     Status: None   Collection Time: 05/11/20  5:15 PM   Specimen: Nasopharyngeal Swab  Result Value Ref Range Status   SARS Coronavirus 2 by RT PCR NEGATIVE NEGATIVE Final    Comment: (NOTE) SARS-CoV-2 target nucleic acids are NOT DETECTED.  The SARS-CoV-2 RNA is generally detectable in upper respiratoy specimens during the acute phase of infection. The lowest concentration of SARS-CoV-2 viral copies this assay can detect is 131 copies/mL. Terianna Peggs negative result does not preclude SARS-Cov-2 infection and should not be used as the sole basis for treatment or other patient management decisions. Ahrianna Siglin negative result may occur with  improper specimen collection/handling, submission of specimen other than nasopharyngeal swab, presence of viral mutation(s) within the areas targeted by this assay, and inadequate number of viral copies (<131 copies/mL). Kastin Cerda negative result must be combined with clinical observations, patient history, and epidemiological information. The expected result is Negative.  Fact Sheet for Patients:  PinkCheek.be  Fact Sheet for Healthcare Providers:  GravelBags.it  This test is no t yet approved or cleared by the [place] FDA and  has been authorized for detection and/or diagnosis of SARS-CoV-2 by FDA under an Emergency Use Authorization (EUA). This EUA will remain  in effect (meaning this  test can be used) for the duration of the COVID-19 declaration under Section 564(b)(1) of the Act, 21 U.S.C. section 360bbb-3(b)(1), unless the authorization is terminated or revoked sooner.     Influenza Darbi Chandran by PCR NEGATIVE NEGATIVE Final   Influenza B by PCR NEGATIVE NEGATIVE Final    Comment: (NOTE) The Xpert Xpress SARS-CoV-2/FLU/RSV assay is intended as an aid in  the diagnosis of influenza from Nasopharyngeal swab specimens and  should not be used as Jasiyah Poland sole basis for treatment. Nasal washings and  aspirates are unacceptable for Xpert Xpress SARS-CoV-2/FLU/RSV  testing.  Fact Sheet for Patients: PinkCheek.be  Fact Sheet for Healthcare Providers: GravelBags.it  This test is not yet approved or cleared by the [place] FDA and  has been authorized for detection and/or diagnosis of SARS-CoV-2 by  FDA under an Emergency Use Authorization (EUA). This EUA will remain  in effect (meaning this test can be used) for the duration of the  Covid-19 declaration under Section 564(b)(1) of the Act, 21  U.S.C. section 360bbb-3(b)(1), unless the authorization is  terminated or revoked.    Respiratory Syncytial Virus by PCR NEGATIVE NEGATIVE Final    Comment: (NOTE) Fact Sheet for Patients: PinkCheek.be  Fact Sheet for Healthcare Providers: GravelBags.it  This test is not yet approved or cleared by the [place] FDA and  has been authorized for detection and/or diagnosis of SARS-CoV-2 by  FDA under an Emergency Use Authorization (EUA). This EUA will remain  in effect (meaning this test can be used) for the duration of the  COVID-19 declaration under Section 564(b)(1) of the Act, 21 U.S.C.  section 360bbb-3(b)(1), unless the authorization is terminated or  revoked. Performed at [facility], [place] [address], [place], [place] [address]   Culture, blood (routine  x 2)     Status: None (Preliminary result)   Collection Time: 05/24/2020  3:45 PM   Specimen: BLOOD  Result Value Ref Range Status   Specimen Description   Final    BLOOD LEFT UPPER ARM Performed at [facility] [address] [place], [place] [address]    Special Requests   Final    BOTTLES DRAWN AEROBIC AND ANAEROBIC Blood Culture adequate volume Performed at [facility] [address], [place], [place] [address]    Culture   Final    NO GROWTH < 24 HOURS Performed at [facility] [address] [place], [place] [address]    Report Status PENDING  Incomplete  Culture, blood (routine x 2)     Status: None (Preliminary result)   Collection Time: 05/17/2020  3:57 PM   Specimen: BLOOD  Result Value Ref Range Status   Specimen Description   Final    BLOOD LEFT ANTECUBITAL Performed at [facility] [address], [place], [place] [address]    Special Requests   Final    BOTTLES DRAWN AEROBIC ONLY Blood Culture results may not be optimal due to an excessive volume of blood received in culture bottles Performed at [facility] [address], [place], [place] [address]    Culture   Final    NO GROWTH < 24 HOURS Performed at [facility] [address] [place], [place] [address]    Report Status PENDING  Incomplete  Respiratory Panel by RT PCR (Flu Yitta Gongaware&B, Covid) - Nasopharyngeal Swab     Status: None   Collection Time: 05/13/2020  4:35 PM   Specimen: Nasopharyngeal Swab  Result Value Ref Range Status   SARS Coronavirus 2 by RT PCR NEGATIVE NEGATIVE Final    Comment: (NOTE) SARS-CoV-2 target nucleic acids are NOT DETECTED.  The SARS-CoV-2 RNA is generally detectable in upper respiratoy specimens during the acute phase of infection. The lowest concentration of SARS-CoV-2 viral copies this assay can detect is 131 copies/mL. Mayar Whittier negative result does not preclude SARS-Cov-2 infection and should  not be used as the sole basis for treatment or other patient management decisions. Tuwanna Krausz negative result may occur with  improper specimen collection/handling, submission of specimen other than nasopharyngeal swab, presence of  viral mutation(s) within the areas targeted by this assay, and inadequate number of viral copies (<131 copies/mL). Kay Ricciuti negative result must be combined with clinical observations, patient history, and epidemiological information. The expected result is Negative.  Fact Sheet for Patients:  PinkCheek.be  Fact Sheet for Healthcare Providers:  GravelBags.it  This test is no t yet approved or cleared by the [place] FDA and  has been authorized for detection and/or diagnosis of SARS-CoV-2 by FDA under an Emergency Use Authorization (EUA). This EUA will remain  in effect (meaning this test can be used) for the duration of the COVID-19 declaration under Section 564(b)(1) of the Act, 21 U.S.C. section 360bbb-3(b)(1), unless the authorization is terminated or revoked sooner.     Influenza Loukisha Gunnerson by PCR NEGATIVE NEGATIVE Final   Influenza B by PCR NEGATIVE NEGATIVE Final    Comment: (NOTE) The Xpert Xpress SARS-CoV-2/FLU/RSV assay is intended as an aid in  the diagnosis of influenza from Nasopharyngeal swab specimens and  should not be used as Boykin Baetz sole basis for treatment. Nasal washings and  aspirates are unacceptable for Xpert Xpress SARS-CoV-2/FLU/RSV  testing.  Fact Sheet for Patients: PinkCheek.be  Fact Sheet for Healthcare Providers: GravelBags.it  This test is not yet approved or cleared by the [place] FDA and  has been authorized for detection and/or diagnosis of SARS-CoV-2 by  FDA under an Emergency Use Authorization (EUA). This EUA will remain  in effect (meaning this test can be used) for the duration of the  Covid-19 declaration under  Section 564(b)(1) of the Act, 21  U.S.C. section 360bbb-3(b)(1), unless the authorization is  terminated or revoked. Performed at [facility], [place] [address], [place], [place] [address]          Radiology Studies: DG Abd 1 View  Result Date: 05/19/2020 CLINICAL DATA:  Ileus. EXAM: ABDOMEN - 1 VIEW COMPARISON:  May 18, 2020. FINDINGS: The bowel gas pattern is normal. No radio-opaque calculi or other significant radiographic abnormality are seen. IMPRESSION: Negative. Electronically Signed   By: Marijo Conception M.D.   On: 05/19/2020 13:56   DG Abd 1 View  Result Date: 05/21/2020 CLINICAL DATA:  Chills and malaise starting yesterday. EXAM: ABDOMEN - 1 VIEW COMPARISON:  Chest 05/03/2020 FINDINGS: Gas and stool throughout the colon without distention. Mid abdominal small bowel are prominent with some dilated gas-filled small bowel loops in the left mid abdomen. Findings may represent early small bowel obstruction. Ileus would be Josealberto Montalto another possibility. Calcifications in the right upper quadrant consistent with gallstones. Cardiac enlargement. Degenerative changes in the spine and hips. IMPRESSION: Prominent mid abdominal small bowel with some dilated gas-filled small bowel loops in the left mid abdomen. Findings may represent early small bowel obstruction versus ileus. Electronically Signed   By: Lucienne Capers M.D.   On: 05/20/2020 21:44   CT Head Wo Contrast  Result Date: 05/24/2020 CLINICAL DATA:  Altered mental status after fall 1 week ago EXAM: CT HEAD WITHOUT CONTRAST TECHNIQUE: Contiguous axial images were obtained from the base of the skull through the vertex without intravenous contrast. COMPARISON:  05/11/2020 FINDINGS: Brain: No evidence of acute infarction, hemorrhage, hydrocephalus, extra-axial collection or mass lesion/mass effect. Mild low-density changes within the periventricular and subcortical white matter compatible with chronic microvascular ischemic  change. Moderate diffuse cerebral volume loss. Vascular: Atherosclerotic calcifications involving the large vessels of the skull base. No unexpected hyperdense vessel. Skull: Normal. Negative for fracture or focal lesion. Sinuses/Orbits: Unchanged including probable retention cyst  in the left sphenoid sinus. Mastoid air cells are clear. Orbital structures unremarkable. Other: None. IMPRESSION: 1. No acute intracranial findings.  Stable exam from 05/11/2020 2. Chronic microvascular ischemic change and cerebral volume loss. Electronically Signed   By: Davina Poke D.O.   On: 05/07/2020 18:59   Portable chest 1 View  Result Date: 05/19/2020 CLINICAL DATA:  Follow-up pneumonia. EXAM: PORTABLE CHEST 1 VIEW COMPARISON:  05/01/2020 and prior radiographs FINDINGS: Cardiomegaly again noted. Increased RIGHT UPPER lobe airspace disease/pneumonia noted. Mild interstitial opacities are again noted. No pneumothorax or large pleural effusion noted. No acute bony abnormalities are present. IMPRESSION: Increased RIGHT UPPER lobe airspace disease/pneumonia. Electronically Signed   By: Margarette Canada M.D.   On: 05/19/2020 12:40   DG Chest Portable 1 View  Result Date: 05/02/2020 CLINICAL DATA:  Altered mental status and chills for 2 days EXAM: PORTABLE CHEST 1 VIEW COMPARISON:  02/06/2016 FINDINGS: Cardiac shadow is enlarged. Increased central vascular congestion is noted. Patchy increased density is noted in the right apex consistent with early infiltrate. Mild interstitial edema is noted. No bony abnormality is noted. IMPRESSION: Early infiltrate in the right apex. Mild CHF. Electronically Signed   By: Inez Catalina M.D.   On: 05/24/2020 15:49   ECHOCARDIOGRAM COMPLETE  Result Date: 05/19/2020    ECHOCARDIOGRAM REPORT   Patient Name:   ALPER GUILMETTE Date of Exam: 05/19/2020 Medical Rec #:  [id number]      Height:       67.0 in Accession #:    [id number]     Weight:       215.6 lb Date of Birth:  [birth year]      BSA:           2.088 m Patient Age:    38 years       BP:           124/70 mmHg Patient Gender: M              HR:           75 bpm. Exam Location:  Inpatient Procedure: 2D Echo, Color Doppler and Cardiac Doppler Indications:    K86.38 Acute systolic (congestive) heart failure  History:        Patient has prior history of Echocardiogram examinations, most                 recent 04/20/2017. Arrythmias:Atrial Fibrillation; Risk                 Factors:Diabetes, Dyslipidemia and Sleep Apnea.  Sonographer:    Raquel Sarna Senior RDCS Referring Phys: Powers Comments: Technically difficult study due to poor echo windows. Patient has been very agitated, obtained pertinent information as quickly as possible. IMPRESSIONS  1. The basal to mid inferior wall appears hypokinetic. EF ~55-60% which is largely unchanged from prior study. The windows were quite poor on this study, and the patient was non-compliant with the exam as documented by the sonographer. Would recommend to repeat Jonet Mathies limited study with contrast when the patient is cooperative. Left ventricular ejection fraction, by estimation, is 55 to 60%. The left ventricle has normal function. The left ventricle demonstrates regional wall motion abnormalities (see scoring diagram/findings for description). Left ventricular diastolic function could not be evaluated.  2. Right ventricular systolic function is mildly reduced. The right ventricular size is moderately enlarged. There is normal pulmonary artery systolic pressure. The estimated right ventricular systolic pressure is 17.7 mmHg.  3. Left atrial  size was mild to moderately dilated.  4. Right atrial size was mildly dilated.  5. The mitral valve is degenerative. No evidence of mitral valve regurgitation. No evidence of mitral stenosis.  6. The aortic valve is calcified. Aortic valve regurgitation is not visualized. Mild to moderate aortic valve sclerosis/calcification is present, without any evidence of  aortic stenosis.  7. The inferior vena cava is normal in size with <50% respiratory variability, suggesting right atrial pressure of 8 mmHg. FINDINGS  Left Ventricle: The basal to mid inferior wall appears hypokinetic. EF ~55-60% which is largely unchanged from prior study. The windows were quite poor on this study, and the patient was non-compliant with the exam as documented by the sonographer. Would recommend to repeat Ruben Mahler limited study with contrast when the patient is cooperative. Left ventricular ejection fraction, by estimation, is 55 to 60%. The left ventricle has normal function. The left ventricle demonstrates regional wall motion abnormalities. The left ventricular internal cavity size was normal in size. There is no left ventricular hypertrophy. Abnormal (paradoxical) septal motion, consistent with left bundle branch block. Left ventricular diastolic function could not be evaluated due to atrial fibrillation. Left ventricular diastolic function could not be evaluated.  LV Wall Scoring: The inferior wall is hypokinetic. Right Ventricle: The right ventricular size is moderately enlarged. No increase in right ventricular wall thickness. Right ventricular systolic function is mildly reduced. There is normal pulmonary artery systolic pressure. The tricuspid regurgitant velocity is 1.90 m/s, and with an assumed right atrial pressure of 8 mmHg, the estimated right ventricular systolic pressure is 81.1 mmHg. Left Atrium: Left atrial size was mild to moderately dilated. Right Atrium: Right atrial size was mildly dilated. Pericardium: Trivial pericardial effusion is present. Mitral Valve: The mitral valve is degenerative in appearance. No evidence of mitral valve regurgitation. No evidence of mitral valve stenosis. Tricuspid Valve: The tricuspid valve is grossly normal. Tricuspid valve regurgitation is trivial. No evidence of tricuspid stenosis. Aortic Valve: The aortic valve is calcified. Aortic valve regurgitation  is not visualized. Mild to moderate aortic valve sclerosis/calcification is present, without any evidence of aortic stenosis. Pulmonic Valve: The pulmonic valve was not well visualized. Pulmonic valve regurgitation is not visualized. Aorta: The aortic root is normal in size and structure. Venous: The inferior vena cava is normal in size with less than 50% respiratory variability, suggesting right atrial pressure of 8 mmHg. IAS/Shunts: The interatrial septum was not well visualized.  LEFT VENTRICLE PLAX 2D LVIDd:         4.20 cm      Diastology LVIDs:         3.20 cm      LV e' medial:    7.18 cm/s LV PW:         1.20 cm      LV E/e' medial:  11.3 LV IVS:        1.00 cm      LV e' lateral:   8.27 cm/s LVOT diam:     2.60 cm      LV E/e' lateral: 9.8 LV SV:         77 LV SV Index:   37 LVOT Area:     5.31 cm  LV Volumes (MOD) LV vol d, MOD A4C: 142.0 ml LV vol s, MOD A4C: 68.4 ml LV SV MOD A4C:     142.0 ml RIGHT VENTRICLE RV S prime:     12.00 cm/s TAPSE (M-mode): 1.8 cm LEFT ATRIUM  Index       RIGHT ATRIUM           Index LA diam:        4.00 cm  1.92 cm/m  RA Area:     23.60 cm LA Vol (A2C):   106.0 ml 50.77 ml/m RA Volume:   74.40 ml  35.63 ml/m LA Vol (A4C):   66.0 ml  31.61 ml/m LA Biplane Vol: 83.8 ml  40.14 ml/m  AORTIC VALVE LVOT Vmax:   90.72 cm/s LVOT Vmean:  58.100 cm/s LVOT VTI:    0.145 m  AORTA Ao Root diam: 3.90 cm MITRAL VALVE               TRICUSPID VALVE MV Area (PHT): 3.08 cm    TR Peak grad:   14.4 mmHg MV Decel Time: 246 msec    TR Vmax:        190.00 cm/s MV E velocity: 81.40 cm/s                            SHUNTS                            Systemic VTI:  0.14 m                            Systemic Diam: 2.60 cm Eleonore Chiquito MD Electronically signed by Eleonore Chiquito MD Signature Date/Time: 05/19/2020/10:45:31 AM    Final         Scheduled Meds: . sodium chloride   Intravenous Once  . apixaban  5 mg Oral BID  . azithromycin  500 mg Oral Daily  . donepezil  5 mg  Oral QHS  . furosemide  40 mg Intravenous Q12H  . [START ON 05/20/2020] influenza vaccine adjuvanted  0.5 mL Intramuscular Tomorrow-1000  . insulin aspart  0-15 Units Subcutaneous TID WC  . levothyroxine  25 mcg Oral Q0600  . losartan  25 mg Oral Daily  . pantoprazole  40 mg Oral Daily  . rOPINIRole  2 mg Oral QHS  . sertraline  50 mg Oral Daily  . sodium chloride flush  3 mL Intravenous Q12H   Continuous Infusions: . sodium chloride    . cefTRIAXone (ROCEPHIN)  IV 2 g (05/19/20 0448)     LOS: 1 day    Time spent: over 2 min    Fayrene Helper, MD [facility]   To contact the attending provider between 7A-7P or the covering provider during after hours 7P-7A, please log into the web site [email or web address] and access using universal [place] password for that web site. If you do not have the password, please call the hospital operator.  05/19/2020, 2:21 PM

## 2020-05-19 NOTE — Progress Notes (Signed)
Echocardiogram 2D Echocardiogram has been performed.  Oneal Deputy Kei Mcelhiney 05/19/2020, 8:48 AM

## 2020-05-19 NOTE — Progress Notes (Addendum)
Night provider notified of 5 second pause on telemetry. Night provider notified of second pause of 5.5 seconds on telemetry.

## 2020-05-19 NOTE — Progress Notes (Signed)
Patient states " I forgot" when asked named. Could not tell me his birthday or any other information. I named a few different months and pt picked December as birth month.   Son Travis Bradford called and informed patient was in a room. Soft mittens on. Patient frequently pulling off condom cath, gown, telemetry and IVs.

## 2020-05-19 NOTE — Consult Note (Signed)
Consultation Note Date: 05/19/2020   Patient Name: Travis Bradford  DOB: Jun 14, 1926  MRN: [id number]  Age / Sex: 86 y.o., male  PCP: Patient, No Pcp Per Referring Physician: Elodia Florence., *  Reason for Consultation: Establishing goals of care  HPI/Patient Profile: 86 y.o. male  with past medical history of a fib, HLD, hypothyroidism, non-hodgkins lymphoma s/p chemo and radiation, prostate CA, and cardiomyopathy admitted on 04/27/2020 with AMS. Patient recently admitted with hospital 05/11/20-10/19-21 with a fib (rate in 20s) and a fall. He was taken off CCB and HR improved. Since discharge patient has experienced rapid decline. PMT consulted for GOC/per son's request.    Clinical Assessment and Goals of Care: I have reviewed medical records including EPIC notes, labs and imaging, received report from RN, assessed the patient and then spoke with patient's son [name]  to discuss diagnosis prognosis, [place], EOL wishes, disposition and options.  I introduced Palliative Medicine as specialized medical care for people living with serious illness. It focuses on providing relief from the symptoms and stress of a serious illness. The goal is to improve quality of life for both the patient and the family.  We discussed a brief life review of the patient. [name] shares about his parents 63 year marriage (patient's wife passed away many years ago with cancer). He tells me how they met and got married 9 weeks later. 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 [name] describes Mr. Dao as tough and fearless.   As far as functional and nutritional status, [name] shares about the patient's recent, fairly rapid, decline. Patient was able to walk with a cane recently but is now much weaker. Needs assistance with ADLs. [name] has also noticed a change in appetite over the past couple of weeks. Patient has had decline in  cognition as well over the past couple of months - [name] suspects dementia. Also with episodes of delirium that have been more concerning. [name] suspects there is an element of depression as well.    We discussed patient's current illness and what it means in the larger context of patient's on-going co-morbidities.  Natural disease trajectory and expectations at EOL were discussed. We discuss patients decline in function, nutrition, and cognition. We discuss his falls and hospitalizations. We discuss his episodes of delirium. All of these topics, in addition to his chronic medical conditions, lead to believe patient is nearing end of life.   I attempted to elicit values and goals of care important to the patient. [name] shares the patient is not interested in aggressive medical care. He also does not want to be admitted to the hospital any more - was resistant to this admission. [name] shares that quality of life is important to patient and, at this point, patient's quality of life is poor d/t his health.    Hospice and Palliative Care services outpatient were explained and offered. Described differences of each. Suggested that hospice care seems appropriate and in line with patient's/son's goals of care. [name] agrees. Will request hospice evaluation.   Questions and concerns were addressed. The family was encouraged to call with questions or concerns.   Primary Decision Maker HCPOA - son Tenoch Mcclure   SUMMARY OF RECOMMENDATIONS   - hospice services at home following discharge - continue current measures to treat pna   Code Status/Advance Care Planning:  DNR  Prognosis:   < 6 months  Discharge Planning: Home with Hospice  Primary Diagnoses: Present on Admission: . Hyperlipidemia . Diabetes mellitus with renal manifestation ([place]) . Hypothyroidism . Restless leg syndrome . HCAP (healthcare-associated pneumonia) . HAP (hospital-acquired pneumonia)   I have reviewed the medical record,  interviewed the patient and family, and examined the patient. The following aspects are pertinent.  Past Medical History:  Diagnosis Date  . Abnormal stress test    a. 07/2012: abnormal stress echo showing hypokinesis of the mid-distal inferior wall suggestive of ischemia; + frequent PVCs, no ST changes or CP. Dr. [name] had a long discussion with the patient about further w/u versus medical therapy and the patient elected medical therapy..  . Arthritis   . Atrial fibrillation ([place])    a. Dx 01/2016.  [name] BPH (benign prostatic hyperplasia)   . Bradycardia   . Dizziness 05/11/2020  . History of blood transfusion   . Hyperlipidemia   . Hypothyroid   . Lymphoma ([place])    15 years ago  . Pneumonia   . Prostate cancer ([place])   . PVC's (premature ventricular contractions)    a. 07/2012 - seen on stress echo.  [name] RBBB    a. h/o intermittent rbbb.  . Restless legs syndrome   . Sinus bradycardia   . Sleep apnea    does not use a cpap   Social History   Socioeconomic History  . Marital status: Widowed    Spouse name: Not on file  . Number of children: 2  . Years of education: Not on file  . Highest education level: Not on file  Occupational History  . Not on file  Tobacco Use  . Smoking status: Former Research scientist (life sciences)  . Smokeless tobacco: Never Used  Vaping Use  . Vaping Use: Never used  Substance and Sexual Activity  . Alcohol use: Not Currently    Comment: Rarely  . Drug use: Never  . Sexual activity: Not on file  Other Topics Concern  . Not on file  Social History Narrative  . Not on file   Social Determinants of Health   Financial Resource Strain:   . Difficulty of Paying Living Expenses: Not on file  Food Insecurity:   . Worried About Charity fundraiser in the Last Year: Not on file  . Ran Out of Food in the Last Year: Not on file  Transportation Needs:   . Lack of Transportation (Medical): Not on file  . Lack of Transportation (Non-Medical): Not on file  Physical  Activity:   . Days of Exercise per Week: Not on file  . Minutes of Exercise per Session: Not on file  Stress:   . Feeling of Stress : Not on file  Social Connections:   . Frequency of Communication with Friends and Family: Not on file  . Frequency of Social Gatherings with Friends and Family: Not on file  . Attends Religious Services: Not on file  . Active Member of Clubs or Organizations: Not on file  . Attends Archivist Meetings: Not on file  . Marital Status: Not on file   Family History  Problem Relation Age of Onset  . CAD Mother   . Stroke Brother         X 2   Scheduled Meds: . sodium chloride   Intravenous Once  . apixaban  5 mg Oral BID  . azithromycin  500 mg Oral Daily  . donepezil  5 mg Oral QHS  . furosemide  40 mg Intravenous Q12H  . [START ON 05/20/2020]  influenza vaccine adjuvanted  0.5 mL Intramuscular Tomorrow-1000  . insulin aspart  0-15 Units Subcutaneous TID WC  . levothyroxine  25 mcg Oral Q0600  . pantoprazole  40 mg Oral Daily  . rOPINIRole  2 mg Oral QHS  . sertraline  50 mg Oral Daily  . sodium chloride flush  3 mL Intravenous Q12H   Continuous Infusions: . sodium chloride    . cefTRIAXone (ROCEPHIN)  IV 2 g (05/19/20 0448)   PRN Meds:.sodium chloride, acetaminophen **OR** acetaminophen, LORazepam, sodium chloride flush Allergies  Allergen Reactions  . Tape Other (See Comments)    THE PATIENT'S SKIN IS VERY THIN- TEARS AND BRUISES EASILY!!!!   Review of Systems  Unable to perform ROS: Mental status change    Physical Exam Constitutional:      General: He is not in acute distress.    Comments: lethargic  Pulmonary:     Effort: Pulmonary effort is normal.  Skin:    General: Skin is warm and dry.  Neurological:     Mental Status: He is disoriented.     Vital Signs: BP 126/64 (BP Location: Left Arm)   Pulse 79   Temp 99.1 F (37.3 C) (Oral)   Resp 19   Ht _0  (1.702 m)   Wt 97.8 kg   SpO2 93%   BMI 33.77 kg/m   Pain Scale: 0-10   Pain Score: 0-No pain   SpO2: SpO2: 93 % O2 Device:SpO2: 93 % O2 Flow Rate: .   IO: Intake/output summary:   Intake/Output Summary (Last 24 hours) at 05/19/2020 1438 Last data filed at 05/19/2020 1300 Gross per 24 hour  Intake 178.15 ml  Output 1663 ml  Net -1484.85 ml    LBM:   Baseline Weight: Weight: 97.8 kg Most recent weight: Weight: 97.8 kg     Palliative Assessment/Data: PPS 40%    Time Total: 70 minutes Greater than 50%  of this time was spent counseling and coordinating care related to the above assessment and plan.  Juel Burrow, DNP, AGNP-C Palliative Medicine Team [phone] Pager: [phone]

## 2020-05-19 NOTE — Progress Notes (Signed)
Patient pulling mittens off successfully with teeth.

## 2020-05-20 ENCOUNTER — Inpatient Hospital Stay (HOSPITAL_COMMUNITY): Payer: Medicare HMO

## 2020-05-20 DIAGNOSIS — Z515 Encounter for palliative care: Secondary | ICD-10-CM | POA: Diagnosis not present

## 2020-05-20 DIAGNOSIS — J189 Pneumonia, unspecified organism: Secondary | ICD-10-CM | POA: Diagnosis not present

## 2020-05-20 DIAGNOSIS — Z7189 Other specified counseling: Secondary | ICD-10-CM | POA: Diagnosis not present

## 2020-05-20 LAB — CBC WITH DIFFERENTIAL/PLATELET
Abs Immature Granulocytes: 0.02 10*3/uL (ref 0.00–0.07)
Basophils Absolute: 0 10*3/uL (ref 0.0–0.1)
Basophils Relative: 0 %
Eosinophils Absolute: 0 10*3/uL (ref 0.0–0.5)
Eosinophils Relative: 0 %
HCT: 41.7 % (ref 39.0–52.0)
Hemoglobin: 14 g/dL (ref 13.0–17.0)
Immature Granulocytes: 0 %
Lymphocytes Relative: 10 %
Lymphs Abs: 0.9 10*3/uL (ref 0.7–4.0)
MCH: 33.1 pg (ref 26.0–34.0)
MCHC: 33.6 g/dL (ref 30.0–36.0)
MCV: 98.6 fL (ref 80.0–100.0)
Monocytes Absolute: 1.1 10*3/uL — ABNORMAL HIGH (ref 0.1–1.0)
Monocytes Relative: 12 %
Neutro Abs: 7.2 10*3/uL (ref 1.7–7.7)
Neutrophils Relative %: 78 %
Platelets: 261 10*3/uL (ref 150–400)
RBC: 4.23 MIL/uL (ref 4.22–5.81)
RDW: 12.6 % (ref 11.5–15.5)
WBC: 9.3 10*3/uL (ref 4.0–10.5)
nRBC: 0 % (ref 0.0–0.2)

## 2020-05-20 LAB — URINE CULTURE: Culture: <10000 — AB

## 2020-05-20 LAB — COMPREHENSIVE METABOLIC PANEL
ALT: 18 U/L (ref 0–44)
AST: 26 U/L (ref 15–41)
Albumin: 3.7 g/dL (ref 3.5–5.0)
Alkaline Phosphatase: 58 U/L (ref 38–126)
Anion gap: 14 (ref 5–15)
BUN: 33 mg/dL — ABNORMAL HIGH (ref 8–23)
CO2: 23 mmol/L (ref 22–32)
Calcium: 8.6 mg/dL — ABNORMAL LOW (ref 8.9–10.3)
Chloride: 97 mmol/L — ABNORMAL LOW (ref 98–111)
Creatinine, Ser: 1.38 mg/dL — ABNORMAL HIGH (ref 0.61–1.24)
GFR, Estimated: 48 mL/min — ABNORMAL LOW (ref >60)
Glucose, Bld: 149 mg/dL — ABNORMAL HIGH (ref 70–99)
Potassium: 3.5 mmol/L (ref 3.5–5.1)
Sodium: 134 mmol/L — ABNORMAL LOW (ref 135–145)
Total Bilirubin: 0.8 mg/dL (ref 0.3–1.2)
Total Protein: 7.3 g/dL (ref 6.5–8.1)

## 2020-05-20 LAB — GLUCOSE, CAPILLARY
Glucose-Capillary: 123 mg/dL — ABNORMAL HIGH (ref 70–99)
Glucose-Capillary: 122 mg/dL — ABNORMAL HIGH (ref 70–99)
Glucose-Capillary: 124 mg/dL — ABNORMAL HIGH (ref 70–99)

## 2020-05-20 LAB — MAGNESIUM: Magnesium: 2.1 mg/dL (ref 1.7–2.4)

## 2020-05-20 LAB — PHOSPHORUS: Phosphorus: 3.2 mg/dL (ref 2.5–4.6)

## 2020-05-20 MED ORDER — HALOPERIDOL 0.5 MG PO TABS
0.5000 mg | ORAL_TABLET | ORAL | Status: DC | PRN
  Filled 2020-05-20: qty 1

## 2020-05-20 MED ORDER — ONDANSETRON 4 MG PO TBDP: 4.0000 mg | ORAL_TABLET | Freq: Four times a day (QID) | ORAL | Status: DC | PRN

## 2020-05-20 MED ORDER — HALOPERIDOL LACTATE 5 MG/ML IJ SOLN: 0.5000 mg | INTRAMUSCULAR | Status: DC | PRN

## 2020-05-20 MED ORDER — MORPHINE SULFATE (PF) 2 MG/ML IV SOLN
0.5000 mg | Freq: Once | INTRAVENOUS | Status: AC | PRN
  Administered 2020-05-20: 0.5 mg via INTRAVENOUS

## 2020-05-20 MED ORDER — ONDANSETRON HCL 4 MG/2ML IJ SOLN: 4.0000 mg | Freq: Four times a day (QID) | INTRAMUSCULAR | Status: DC | PRN

## 2020-05-20 MED ORDER — HALOPERIDOL LACTATE 2 MG/ML PO CONC: 0.5000 mg | ORAL | Status: DC | PRN

## 2020-05-20 MED ORDER — HALOPERIDOL LACTATE 5 MG/ML IJ SOLN
1.0000 mg | INTRAMUSCULAR | Status: DC | PRN
  Filled 2020-05-20: qty 1

## 2020-05-20 MED ORDER — SODIUM CHLORIDE 0.9 % IV SOLN: 100.0000 mg | Freq: Two times a day (BID) | INTRAVENOUS | Status: DC

## 2020-05-20 MED ORDER — LORAZEPAM 1 MG PO TABS: 1.0000 mg | ORAL_TABLET | ORAL | Status: DC | PRN

## 2020-05-20 MED ORDER — POLYVINYL ALCOHOL 1.4 % OP SOLN
1.0000 [drp] | Freq: Four times a day (QID) | OPHTHALMIC | Status: DC | PRN
  Filled 2020-05-20: qty 15

## 2020-05-20 MED ORDER — LORAZEPAM 2 MG/ML IJ SOLN
1.0000 mg | INTRAMUSCULAR | Status: DC | PRN
  Administered 2020-05-22 – 2020-05-24 (×7): 1 mg via INTRAVENOUS
  Filled 2020-05-20 (×7): qty 1

## 2020-05-20 MED ORDER — HALOPERIDOL LACTATE 2 MG/ML PO CONC
0.5000 mg | ORAL | Status: DC | PRN
  Filled 2020-05-20: qty 0.3

## 2020-05-20 MED ORDER — DOXYCYCLINE HYCLATE 100 MG PO TABS: 100.0000 mg | ORAL_TABLET | Freq: Two times a day (BID) | ORAL | Status: DC

## 2020-05-20 MED ORDER — GLYCOPYRROLATE 1 MG PO TABS
1.0000 mg | ORAL_TABLET | ORAL | Status: DC | PRN
  Filled 2020-05-20: qty 1

## 2020-05-20 MED ORDER — GLYCOPYRROLATE 0.2 MG/ML IJ SOLN
0.2000 mg | INTRAMUSCULAR | Status: DC | PRN
  Administered 2020-05-22: 0.2 mg via SUBCUTANEOUS
  Filled 2020-05-20: qty 1

## 2020-05-20 MED ORDER — HALOPERIDOL 0.5 MG PO TABS: 0.5000 mg | ORAL_TABLET | ORAL | Status: DC | PRN

## 2020-05-20 MED ORDER — LORAZEPAM 2 MG/ML IJ SOLN: 0.5000 mg | Freq: Four times a day (QID) | INTRAMUSCULAR | Status: DC | PRN

## 2020-05-20 MED ORDER — MORPHINE SULFATE (PF) 2 MG/ML IV SOLN
2.0000 mg | INTRAVENOUS | Status: DC | PRN
  Administered 2020-05-20 – 2020-05-21 (×4): 2 mg via INTRAVENOUS
  Filled 2020-05-20 (×5): qty 1

## 2020-05-20 MED ORDER — KCL-LACTATED RINGERS-D5W 20 MEQ/L IV SOLN
INTRAVENOUS | Status: AC
  Filled 2020-05-20 (×2): qty 1000

## 2020-05-20 MED ORDER — BIOTENE DRY MOUTH MT LIQD: 15.0000 mL | OROMUCOSAL | Status: DC | PRN

## 2020-05-20 MED ORDER — GLYCOPYRROLATE 0.2 MG/ML IJ SOLN
0.2000 mg | INTRAMUSCULAR | Status: DC | PRN
  Filled 2020-05-20: qty 1

## 2020-05-20 MED ORDER — LORAZEPAM 2 MG/ML PO CONC: 1.0000 mg | ORAL | Status: DC | PRN

## 2020-05-20 MED ORDER — MORPHINE SULFATE (PF) 2 MG/ML IV SOLN: 1.0000 mg | INTRAVENOUS | Status: DC | PRN

## 2020-05-20 MED ORDER — MORPHINE SULFATE (PF) 2 MG/ML IV SOLN
0.5000 mg | INTRAVENOUS | Status: DC | PRN
  Administered 2020-05-20: 0.5 mg via INTRAVENOUS
  Filled 2020-05-20 (×2): qty 1

## 2020-05-20 MED ORDER — DEXTROSE IN LACTATED RINGERS 5 % IV SOLN
INTRAVENOUS | Status: DC
Start: 2020-05-20 — End: 2020-05-20

## 2020-05-20 NOTE — TOC Progression Note (Addendum)
Transition of Care [facility]) - Progression Note    Patient Details  Name: Travis Bradford MRN: [id number] Date of Birth: May 31, 1926  [facility] CM/SW Contact  Purcell Mouton, RN Phone Number: 05/20/2020, 11:20 AM  Clinical Narrative:     Pt lives with his son who is HCPOA paper on chart. Pt was active with [facility] for HHRN/PT.   Expected Discharge Plan: [place] Barriers to Discharge: No Barriers Identified  Expected Discharge Plan and Services Expected Discharge Plan: [place] arrangements for the past 2 months: Single Family Home                                       Social Determinants of Health (SDOH) Interventions    Readmission Risk Interventions No flowsheet data found.

## 2020-05-20 NOTE — Progress Notes (Signed)
PROGRESS NOTE    Travis Bradford  KCL:[id number] DOB: 1925/12/27 DOA: 04/26/2020 PCP: Patient, No Pcp Per   Chief Complaint  Patient presents with  . Chills  . Fatigue    Brief Narrative: Travis Bradford is Travis Bradford 86 y.o. male with medical history significant of abnormal stress test in 2014 (managed conservatively), permanent atrial fibrillation, BPH, HLD, hypothyroidism, lymphoma, prostate CA, PVCs, intermittent RBBB, sleep apnea (ptadamantly declinescpap), bradycardia, mildly dilated root/ascending aorta 2018, mild cardiomyopathy in 2017 (normal EF 2018),non-Hodgkin's lymphoma 20 years ago status post chemotherapy and radiation, carotid disease, traumatic hematoma of the thigh in 09/2019. He was recently admitted by [facility] 10-16/10-19/21 after Travis Bradford fall and was found to be in chronic Travis Bradford. Fib with slow ventricular response. His medications were adjusted - CCB stopped, repinirole decreased and he was able to be discharged home. During his hospital stay he did exhibit agitation in Travis Bradford setting of baseline dementia.  Since discharge home his son reports rapid decline: loss of appetite, decreased general function, increased somnolence. There have been no falls, no injuries, no changes in the home environment. Travis Bradford has always been irrascible and difficult. He has refused to establish with Mccoy Testa PCP, he has refused CPAP for severe OSA. The son raised the question of rapidly progressive dementia along with possible depression.  Due to his rapid decline he presents to [facility] for evaluation.  Assessment & Plan:   Active Problems:   Hyperlipidemia   Diabetes mellitus with renal manifestation (HCC)   Hypothyroidism   Restless leg syndrome   HCAP (healthcare-associated pneumonia)   HAP (hospital-acquired pneumonia)   Goals of care, counseling/discussion   Palliative care by specialist   DNR (do not resuscitate)  Goals of Care Discussed with son today, initially planned for home with hospice, but  if he's not improving will likely need [facility].  Morphine added for comfort today.  Dr. Domingo Cocking planning to see him as well, appreciate assistance.  Acute Metabolic Encephalopathy  Dementia  Depression Likely 2/2 aspiration pneumonia and hospitalization He has continued agitated delirium today Currently NPO due to inability to swallow safely Low dose ativan prn Morphine added for discomfort Delirium precautions Low dose prn ativan  No antipsychotic at this time due to qt prolongation Head CT without acute findings Appears uncomfortable, discussed adding morphine to see if this helps him to be more comfortable.  HCAP:  CXR 10/24 with RUL pneumonia CXR 10/25 with RUL pneumonia - recommending outpatient follow up Continue ceftriaxone (10/23 - ), doxycycline (10/26).  Azithromycin 10/23-25. Follow blood cx (NGTD), sputum cx if able Negative flu/covid.   Follow MRSA PCR SLP eval -> recommending NPO  Ileus: Large BM 10/24, improved, imaging improved, negative kub today KUB 10/23 with dilated gas filled small bowel loobs, early obstruction vs ileus  HFpEF Doesn't appear grossly overloaded, elevated BNP Echo 10/24 with EF 55-60%, basal to mid inferior wall hypokinesis (see report), mildly reduced RVSF Recommended repeat limited study with contrast when pt cooperative Hold additional lasix, hold losartan Strict I/O, daily weights  AKI Gentle IVF  T2DM:  SSI  Hypothyroidism: Continue home meds  OSA He's refused to wear cpap Nocturnal O2, continue to monitor  Goals of Care Palliative care c/s, appreciate recs  DVT prophylaxis: continue eliquis Code Status: DNR Family Communication: none at bedside Disposition:   Status is: Inpatient  Remains inpatient appropriate because:Inpatient level of care appropriate due to severity of illness   Dispo: The patient is from: Home  Anticipated d/c is to: pending              Anticipated d/c date is: > 3 days               Patient currently is not medically stable to d/c.  Consultants:   Palliative care  Procedures:  echo IMPRESSIONS    1. The basal to mid inferior wall appears hypokinetic. EF ~55-60% which  is largely unchanged from prior study. The windows were quite poor on this  study, and the patient was non-compliant with the exam as documented by  the sonographer. Would recommend  to repeat Devani Odonnel limited study with contrast when the patient is cooperative.  Left ventricular ejection fraction, by estimation, is 55 to 60%. The left  ventricle has normal function. The left ventricle demonstrates regional  wall motion abnormalities (see  scoring diagram/findings for description). Left ventricular diastolic  function could not be evaluated.  2. Right ventricular systolic function is mildly reduced. The right  ventricular size is moderately enlarged. There is normal pulmonary artery  systolic pressure. The estimated right ventricular systolic pressure is  96.2 mmHg.  3. Left atrial size was mild to moderately dilated.  4. Right atrial size was mildly dilated.  5. The mitral valve is degenerative. No evidence of mitral valve  regurgitation. No evidence of mitral stenosis.  6. The aortic valve is calcified. Aortic valve regurgitation is not  visualized. Mild to moderate aortic valve sclerosis/calcification is  present, without any evidence of aortic stenosis.  7. The inferior vena cava is normal in size with <50% respiratory  variability, suggesting right atrial pressure of 8 mmHg.   FINDINGS  Left Ventricle: The basal to mid inferior wall appears hypokinetic. EF  ~55-60% which is largely unchanged from prior study. The windows were  quite poor on this study, and the patient was non-compliant with the exam  as documented by the sonographer.  Would recommend to repeat Corry Storie limited study with contrast when the patient  is cooperative. Left ventricular ejection fraction, by estimation,  is 55  to 60%. The left ventricle has normal function. The left ventricle  demonstrates regional wall motion  abnormalities. The left ventricular internal cavity size was normal in  size. There is no left ventricular hypertrophy. Abnormal (paradoxical)  septal motion, consistent with left bundle branch block. Left ventricular  diastolic function could not be  evaluated due to atrial fibrillation. Left ventricular diastolic function  could not be evaluated.   Antimicrobials: Anti-infectives (From admission, onward)   Start     Dose/Rate Route Frequency Ordered Stop   05/21/20 1000  doxycycline (VIBRA-TABS) tablet 100 mg  Status:  Discontinued        100 mg Oral Every 12 hours 05/20/20 1012 05/20/20 1114   05/21/20 1000  doxycycline (VIBRAMYCIN) 100 mg in sodium chloride 0.9 % 250 mL IVPB        100 mg 125 mL/hr over 120 Minutes Intravenous Every 12 hours 05/20/20 1114     05/19/20 0600  cefTRIAXone (ROCEPHIN) 2 g in sodium chloride 0.9 % 100 mL IVPB        2 g 200 mL/hr over 30 Minutes Intravenous Every 24 hours 05/23/2020 2029 05/24/20 0559   05/19/2020 2130  azithromycin (ZITHROMAX) tablet 500 mg  Status:  Discontinued        500 mg Oral Daily 05/10/2020 2029 05/20/20 1012   05/06/2020 1915  vancomycin (VANCOREADY) IVPB 1500 mg/300 mL  1,500 mg 150 mL/hr over 120 Minutes Intravenous  Once 05/09/2020 1902 04/30/2020 2238   05/08/2020 1830  ceFEPIme (MAXIPIME) 1 g in sodium chloride 0.9 % 100 mL IVPB        1 g 200 mL/hr over 30 Minutes Intravenous  Once 05/24/2020 1825 05/17/2020 1928      Subjective: Agitated, moaning  Objective: Vitals:   05/19/20 1515 05/19/20 2204 05/20/20 0620 05/20/20 1425  BP:  135/72 107/65 116/64  Pulse:  72 70 84  Resp:  20 20 20   Temp:  99.9 F (37.7 C) 98.2 F (36.8 C) 98.3 F (36.8 C)  TempSrc:  Oral Oral Oral  SpO2: 94% 96% 93% 94%  Weight:      Height:        Intake/Output Summary (Last 24 hours) at 05/20/2020 1642 Last data filed at 05/20/2020  1500 Gross per 24 hour  Intake 173.58 ml  Output 325 ml  Net -151.42 ml   Filed Weights   05/19/20 0053  Weight: 97.8 kg    Examination:  General: No acute distress. Cardiovascular: Heart sounds show Braedon Sjogren regular rate, and rhythm Lungs: coarse breath sounds Abdomen: Soft, nontender, nondistended Neurological: PERRL, agitated delirium, moving all extremities, restless in bed Skin: Warm and dry. No rashes or lesions. Extremities: No clubbing or cyanosis. No edema.  Data Reviewed: I have personally reviewed following labs and imaging studies  CBC: Recent Labs  Lab 05/14/20 0714 05/22/2020 1518 05/20/20 0428  WBC 10.4 8.7 9.3  NEUTROABS  --  6.1 7.2  HGB 12.7* 12.4* 14.0  HCT 39.1 39.1 41.7  MCV 99.7 102.1* 98.6  PLT 285 246 387    Basic Metabolic Panel: Recent Labs  Lab 05/14/20 0714 05/06/2020 1518 05/19/20 0511 05/20/20 0428  NA 139 135 132* 134*  K 4.2 3.9 3.7 3.5  CL 103 99 96* 97*  CO2 29 26 23 23   GLUCOSE 139* 108* 170* 149*  BUN 22 19 23  33*  CREATININE 1.00 1.00 1.12 1.38*  CALCIUM 9.5 8.8* 8.5* 8.6*  MG  --   --   --  2.1  PHOS  --   --   --  3.2    GFR: Estimated Creatinine Clearance: 38.9 mL/min ([place]) (by C-G formula based on SCr of 1.38 mg/dL (H)).  Liver Function Tests: Recent Labs  Lab 05/24/2020 1518 05/20/20 0428  AST 21 26  ALT 17 18  ALKPHOS 62 58  BILITOT 0.8 0.8  PROT 6.8 7.3  ALBUMIN 3.6 3.7    CBG: Recent Labs  Lab 05/19/20 1632 05/19/20 2205 05/20/20 0753 05/20/20 1202 05/20/20 1619  GLUCAP 149* 120* 123* 122* 124*     Recent Results (from the past 240 hour(s))  Resp Panel by RT PCR (RSV, Flu Keirston Saephanh&B, Covid) - Nasopharyngeal Swab     Status: None   Collection Time: 05/11/20  5:15 PM   Specimen: Nasopharyngeal Swab  Result Value Ref Range Status   SARS Coronavirus 2 by RT PCR NEGATIVE NEGATIVE Final    Comment: (NOTE) SARS-CoV-2 target nucleic acids are NOT DETECTED.  The SARS-CoV-2 RNA is generally detectable in upper  respiratoy specimens during the acute phase of infection. The lowest concentration of SARS-CoV-2 viral copies this assay can detect is 131 copies/mL. Zilda No negative result does not preclude SARS-Cov-2 infection and should not be used as the sole basis for treatment or other patient management decisions. Cheyrl Buley negative result may occur with  improper specimen collection/handling, submission of specimen other than nasopharyngeal swab, presence  of viral mutation(s) within the areas targeted by this assay, and inadequate number of viral copies (<131 copies/mL). Annahi Short negative result must be combined with clinical observations, patient history, and epidemiological information. The expected result is Negative.  Fact Sheet for Patients:  PinkCheek.be  Fact Sheet for Healthcare Providers:  GravelBags.it  This test is no t yet approved or cleared by the [place] FDA and  has been authorized for detection and/or diagnosis of SARS-CoV-2 by FDA under an Emergency Use Authorization (EUA). This EUA will remain  in effect (meaning this test can be used) for the duration of the COVID-19 declaration under Section 564(b)(1) of the Act, 21 U.S.C. section 360bbb-3(b)(1), unless the authorization is terminated or revoked sooner.     Influenza Arlissa Monteverde by PCR NEGATIVE NEGATIVE Final   Influenza B by PCR NEGATIVE NEGATIVE Final    Comment: (NOTE) The Xpert Xpress SARS-CoV-2/FLU/RSV assay is intended as an aid in  the diagnosis of influenza from Nasopharyngeal swab specimens and  should not be used as Navid Lenzen sole basis for treatment. Nasal washings and  aspirates are unacceptable for Xpert Xpress SARS-CoV-2/FLU/RSV  testing.  Fact Sheet for Patients: PinkCheek.be  Fact Sheet for Healthcare Providers: GravelBags.it  This test is not yet approved or cleared by the [place] FDA and  has been  authorized for detection and/or diagnosis of SARS-CoV-2 by  FDA under an Emergency Use Authorization (EUA). This EUA will remain  in effect (meaning this test can be used) for the duration of the  Covid-19 declaration under Section 564(b)(1) of the Act, 21  U.S.C. section 360bbb-3(b)(1), unless the authorization is  terminated or revoked.    Respiratory Syncytial Virus by PCR NEGATIVE NEGATIVE Final    Comment: (NOTE) Fact Sheet for Patients: PinkCheek.be  Fact Sheet for Healthcare Providers: GravelBags.it  This test is not yet approved or cleared by the [place] FDA and  has been authorized for detection and/or diagnosis of SARS-CoV-2 by  FDA under an Emergency Use Authorization (EUA). This EUA will remain  in effect (meaning this test can be used) for the duration of the  COVID-19 declaration under Section 564(b)(1) of the Act, 21 U.S.C.  section 360bbb-3(b)(1), unless the authorization is terminated or  revoked. Performed at [facility], [place] [address], [place], [place] [address]   Culture, blood (routine x 2)     Status: None (Preliminary result)   Collection Time: 05/13/2020  3:45 PM   Specimen: BLOOD  Result Value Ref Range Status   Specimen Description   Final    BLOOD LEFT UPPER ARM Performed at [facility] [address], [place], [place] [address]    Special Requests   Final    BOTTLES DRAWN AEROBIC AND ANAEROBIC Blood Culture adequate volume Performed at [facility] [address], [place], [place] [address]    Culture   Final    NO GROWTH 2 DAYS Performed at [facility] [address], [place], [place] [address]    Report Status PENDING  Incomplete  Culture, blood (routine x 2)     Status: None (Preliminary result)   Collection Time: 05/11/2020  3:57 PM   Specimen: BLOOD  Result Value Ref Range Status   Specimen Description   Final    BLOOD LEFT  ANTECUBITAL Performed at [facility] [address], [place], [place] [address]    Special Requests   Final    BOTTLES DRAWN AEROBIC ONLY Blood  Culture results may not be optimal due to an excessive volume of blood received in culture bottles Performed at [facility] [address], [place], [place] [address]    Culture   Final    NO GROWTH 2 DAYS Performed at [facility] [address], [place], [place] [address]    Report Status PENDING  Incomplete  Respiratory Panel by RT PCR (Flu Kymberly Blomberg&B, Covid) - Nasopharyngeal Swab     Status: None   Collection Time: 05/23/2020  4:35 PM   Specimen: Nasopharyngeal Swab  Result Value Ref Range Status   SARS Coronavirus 2 by RT PCR NEGATIVE NEGATIVE Final    Comment: (NOTE) SARS-CoV-2 target nucleic acids are NOT DETECTED.  The SARS-CoV-2 RNA is generally detectable in upper respiratoy specimens during the acute phase of infection. The lowest concentration of SARS-CoV-2 viral copies this assay can detect is 131 copies/mL. Allan Bacigalupi negative result does not preclude SARS-Cov-2 infection and should not be used as the sole basis for treatment or other patient management decisions. Wilton Thrall negative result may occur with  improper specimen collection/handling, submission of specimen other than nasopharyngeal swab, presence of viral mutation(s) within the areas targeted by this assay, and inadequate number of viral copies (<131 copies/mL). Jeremih Dearmas negative result must be combined with clinical observations, patient history, and epidemiological information. The expected result is Negative.  Fact Sheet for Patients:  PinkCheek.be  Fact Sheet for Healthcare Providers:  GravelBags.it  This test is no t yet approved or cleared by the [place] FDA and  has been authorized for detection and/or diagnosis of SARS-CoV-2 by FDA under an Emergency Use Authorization (EUA).  This EUA will remain  in effect (meaning this test can be used) for the duration of the COVID-19 declaration under Section 564(b)(1) of the Act, 21 U.S.C. section 360bbb-3(b)(1), unless the authorization is terminated or revoked sooner.     Influenza Princess Karnes by PCR NEGATIVE NEGATIVE Final   Influenza B by PCR NEGATIVE NEGATIVE Final    Comment: (NOTE) The Xpert Xpress SARS-CoV-2/FLU/RSV assay is intended as an aid in  the diagnosis of influenza from Nasopharyngeal swab specimens and  should not be used as Delmore Sear sole basis for treatment. Nasal washings and  aspirates are unacceptable for Xpert Xpress SARS-CoV-2/FLU/RSV  testing.  Fact Sheet for Patients: PinkCheek.be  Fact Sheet for Healthcare Providers: GravelBags.it  This test is not yet approved or cleared by the [place] FDA and  has been authorized for detection and/or diagnosis of SARS-CoV-2 by  FDA under an Emergency Use Authorization (EUA). This EUA will remain  in effect (meaning this test can be used) for the duration of the  Covid-19 declaration under Section 564(b)(1) of the Act, 21  U.S.C. section 360bbb-3(b)(1), unless the authorization is  terminated or revoked. Performed at [facility], [place] [address], [place], [place] [address]   Urine culture     Status: Abnormal   Collection Time: 05/01/2020  5:17 PM   Specimen: Urine, Clean Catch  Result Value Ref Range Status   Specimen Description   Final    URINE, CLEAN CATCH Performed at [facility], [place] [address] [place], [place] [address]    Special Requests   Final    NONE Performed at [facility], [place] [address] [place], [place] [address]    Culture (Safira Proffit)  Final    <10,000 COLONIES/mL INSIGNIFICANT GROWTH Performed at [facility] [address], [place], [place]  [address]    Report Status 05/20/2020 FINAL  Final         Radiology  Studies: DG Abd 1 View  Result Date: 05/19/2020 CLINICAL DATA:  Ileus. EXAM: ABDOMEN - 1 VIEW COMPARISON:  May 18, 2020. FINDINGS: The bowel gas pattern is normal. No radio-opaque calculi or other significant radiographic abnormality are seen. IMPRESSION: Negative. Electronically Signed   By: Marijo Conception M.D.   On: 05/19/2020 13:56   DG Abd 1 View  Result Date: 05/16/2020 CLINICAL DATA:  Chills and malaise starting yesterday. EXAM: ABDOMEN - 1 VIEW COMPARISON:  Chest 05/08/2020 FINDINGS: Gas and stool throughout the colon without distention. Mid abdominal small bowel are prominent with some dilated gas-filled small bowel loops in the left mid abdomen. Findings may represent early small bowel obstruction. Ileus would be Aneesa Romey another possibility. Calcifications in the right upper quadrant consistent with gallstones. Cardiac enlargement. Degenerative changes in the spine and hips. IMPRESSION: Prominent mid abdominal small bowel with some dilated gas-filled small bowel loops in the left mid abdomen. Findings may represent early small bowel obstruction versus ileus. Electronically Signed   By: Lucienne Capers M.D.   On: 05/08/2020 21:44   CT Head Wo Contrast  Result Date: 05/07/2020 CLINICAL DATA:  Altered mental status after fall 1 week ago EXAM: CT HEAD WITHOUT CONTRAST TECHNIQUE: Contiguous axial images were obtained from the base of the skull through the vertex without intravenous contrast. COMPARISON:  05/11/2020 FINDINGS: Brain: No evidence of acute infarction, hemorrhage, hydrocephalus, extra-axial collection or mass lesion/mass effect. Mild low-density changes within the periventricular and subcortical white matter compatible with chronic microvascular ischemic change. Moderate diffuse cerebral volume loss. Vascular: Atherosclerotic calcifications involving the large vessels of the skull base. No unexpected hyperdense vessel. Skull: Normal. Negative for fracture or focal lesion.  Sinuses/Orbits: Unchanged including probable retention cyst in the left sphenoid sinus. Mastoid air cells are clear. Orbital structures unremarkable. Other: None. IMPRESSION: 1. No acute intracranial findings.  Stable exam from 05/11/2020 2. Chronic microvascular ischemic change and cerebral volume loss. Electronically Signed   By: Davina Poke D.O.   On: 04/26/2020 18:59   DG CHEST PORT 1 VIEW  Result Date: 05/20/2020 CLINICAL DATA:  Altered mental status EXAM: PORTABLE CHEST 1 VIEW COMPARISON:  May 19, 2020 FINDINGS: Airspace opacity consistent with pneumonia is again noted in the right upper lobe. The lungs elsewhere are clear. Heart is mildly enlarged with pulmonary vascularity normal. No adenopathy. There is aortic atherosclerosis. No bone lesions. IMPRESSION: Airspace opacity consistent with pneumonia right upper lobe. Lungs elsewhere clear. Stable cardiac prominence. Aortic Atherosclerosis (ICD10-I70.0). Followup PA and lateral chest radiographs recommended in 3-4 weeks following trial of antibiotic therapy to ensure resolution and exclude underlying malignancy. Electronically Signed   By: Lowella Grip III M.D.   On: 05/20/2020 14:04   Portable chest 1 View  Result Date: 05/19/2020 CLINICAL DATA:  Follow-up pneumonia. EXAM: PORTABLE CHEST 1 VIEW COMPARISON:  05/15/2020 and prior radiographs FINDINGS: Cardiomegaly again noted. Increased RIGHT UPPER lobe airspace disease/pneumonia noted. Mild interstitial opacities are again noted. No pneumothorax or large pleural effusion noted. No acute bony abnormalities are present. IMPRESSION: Increased RIGHT UPPER lobe airspace disease/pneumonia. Electronically Signed   By: Margarette Canada M.D.   On: 05/19/2020 12:40   ECHOCARDIOGRAM COMPLETE  Result Date: 05/19/2020    ECHOCARDIOGRAM REPORT   Patient Name:   JOUD INGWERSEN Date of Exam: 05/19/2020 Medical Rec #:  [id number]      Height:  67.0 in Accession #:    [id number]     Weight:        215.6 lb Date of Birth:  [birth year]      BSA:          2.088 m Patient Age:    71 years       BP:           124/70 mmHg Patient Gender: M              HR:           75 bpm. Exam Location:  Inpatient Procedure: 2D Echo, Color Doppler and Cardiac Doppler Indications:    V37.10 Acute systolic (congestive) heart failure  History:        Patient has prior history of Echocardiogram examinations, most                 recent 04/20/2017. Arrythmias:Atrial Fibrillation; Risk                 Factors:Diabetes, Dyslipidemia and Sleep Apnea.  Sonographer:    Raquel Sarna Senior RDCS Referring Phys: [place] Comments: Technically difficult study due to poor echo windows. Patient has been very agitated, obtained pertinent information as quickly as possible. IMPRESSIONS  1. The basal to mid inferior wall appears hypokinetic. EF ~55-60% which is largely unchanged from prior study. The windows were quite poor on this study, and the patient was non-compliant with the exam as documented by the sonographer. Would recommend to repeat Lilyan Prete limited study with contrast when the patient is cooperative. Left ventricular ejection fraction, by estimation, is 55 to 60%. The left ventricle has normal function. The left ventricle demonstrates regional wall motion abnormalities (see scoring diagram/findings for description). Left ventricular diastolic function could not be evaluated.  2. Right ventricular systolic function is mildly reduced. The right ventricular size is moderately enlarged. There is normal pulmonary artery systolic pressure. The estimated right ventricular systolic pressure is 62.6 mmHg.  3. Left atrial size was mild to moderately dilated.  4. Right atrial size was mildly dilated.  5. The mitral valve is degenerative. No evidence of mitral valve regurgitation. No evidence of mitral stenosis.  6. The aortic valve is calcified. Aortic valve regurgitation is not visualized. Mild to moderate aortic valve  sclerosis/calcification is present, without any evidence of aortic stenosis.  7. The inferior vena cava is normal in size with <50% respiratory variability, suggesting right atrial pressure of 8 mmHg. FINDINGS  Left Ventricle: The basal to mid inferior wall appears hypokinetic. EF ~55-60% which is largely unchanged from prior study. The windows were quite poor on this study, and the patient was non-compliant with the exam as documented by the sonographer. Would recommend to repeat Tomica Arseneault limited study with contrast when the patient is cooperative. Left ventricular ejection fraction, by estimation, is 55 to 60%. The left ventricle has normal function. The left ventricle demonstrates regional wall motion abnormalities. The left ventricular internal cavity size was normal in size. There is no left ventricular hypertrophy. Abnormal (paradoxical) septal motion, consistent with left bundle branch block. Left ventricular diastolic function could not be evaluated due to atrial fibrillation. Left ventricular diastolic function could not be evaluated.  LV Wall Scoring: The inferior wall is hypokinetic. Right Ventricle: The right ventricular size is moderately enlarged. No increase in right ventricular wall thickness. Right ventricular systolic function is mildly reduced. There is normal pulmonary artery systolic pressure. The tricuspid regurgitant velocity is 1.90 m/s, and with  an assumed right atrial pressure of 8 mmHg, the estimated right ventricular systolic pressure is 40.1 mmHg. Left Atrium: Left atrial size was mild to moderately dilated. Right Atrium: Right atrial size was mildly dilated. Pericardium: Trivial pericardial effusion is present. Mitral Valve: The mitral valve is degenerative in appearance. No evidence of mitral valve regurgitation. No evidence of mitral valve stenosis. Tricuspid Valve: The tricuspid valve is grossly normal. Tricuspid valve regurgitation is trivial. No evidence of tricuspid stenosis. Aortic  Valve: The aortic valve is calcified. Aortic valve regurgitation is not visualized. Mild to moderate aortic valve sclerosis/calcification is present, without any evidence of aortic stenosis. Pulmonic Valve: The pulmonic valve was not well visualized. Pulmonic valve regurgitation is not visualized. Aorta: The aortic root is normal in size and structure. Venous: The inferior vena cava is normal in size with less than 50% respiratory variability, suggesting right atrial pressure of 8 mmHg. IAS/Shunts: The interatrial septum was not well visualized.  LEFT VENTRICLE PLAX 2D LVIDd:         4.20 cm      Diastology LVIDs:         3.20 cm      LV e' medial:    7.18 cm/s LV PW:         1.20 cm      LV E/e' medial:  11.3 LV IVS:        1.00 cm      LV e' lateral:   8.27 cm/s LVOT diam:     2.60 cm      LV E/e' lateral: 9.8 LV SV:         77 LV SV Index:   37 LVOT Area:     5.31 cm  LV Volumes (MOD) LV vol d, MOD A4C: 142.0 ml LV vol s, MOD A4C: 68.4 ml LV SV MOD A4C:     142.0 ml RIGHT VENTRICLE RV S prime:     12.00 cm/s TAPSE (M-mode): 1.8 cm LEFT ATRIUM              Index       RIGHT ATRIUM           Index LA diam:        4.00 cm  1.92 cm/m  RA Area:     23.60 cm LA Vol (A2C):   106.0 ml 50.77 ml/m RA Volume:   74.40 ml  35.63 ml/m LA Vol (A4C):   66.0 ml  31.61 ml/m LA Biplane Vol: 83.8 ml  40.14 ml/m  AORTIC VALVE LVOT Vmax:   90.72 cm/s LVOT Vmean:  58.100 cm/s LVOT VTI:    0.145 m  AORTA Ao Root diam: 3.90 cm MITRAL VALVE               TRICUSPID VALVE MV Area (PHT): 3.08 cm    TR Peak grad:   14.4 mmHg MV Decel Time: 246 msec    TR Vmax:        190.00 cm/s MV E velocity: 81.40 cm/s                            SHUNTS                            Systemic VTI:  0.14 m  Systemic Diam: 2.60 cm Eleonore Chiquito MD Electronically signed by Eleonore Chiquito MD Signature Date/Time: 05/19/2020/10:45:31 AM    Final         Scheduled Meds: . sodium chloride   Intravenous Once  . apixaban  5 mg  Oral BID  . insulin aspart  0-15 Units Subcutaneous TID WC  . levothyroxine  25 mcg Oral Q0600  . pantoprazole  40 mg Oral Daily  . rOPINIRole  2 mg Oral QHS  . sertraline  50 mg Oral Daily  . sodium chloride flush  3 mL Intravenous Q12H   Continuous Infusions: . sodium chloride    . cefTRIAXone (ROCEPHIN)  IV 2 g (05/20/20 0539)  . dextrose 5% lactated ringers with KCl 20 mEq/L 50 mL/hr at 05/20/20 1350  . [START ON 05/21/2020] doxycycline (VIBRAMYCIN) IV       LOS: 2 days    Time spent: over 30 min    Fayrene Helper, MD [facility]   To contact the attending provider between 7A-7P or the covering provider during after hours 7P-7A, please log into the web site [email or web address] and access using universal [place] password for that web site. If you do not have the password, please call the hospital operator.  05/20/2020, 4:42 PM

## 2020-05-20 NOTE — Telephone Encounter (Signed)
Patient currently admitted

## 2020-05-20 NOTE — Progress Notes (Signed)
Palliative care progress note  Reason for visit: goals of care in light of PNA with continued decline  Received call that patient's son would like to meet again with palliative medicine team as Mr. Diskin has continued to decline.  I called and discussed case with Dr. Florene Glen.  Chart reviewed including personal review of pertinent labs and imaging.  I saw and examined Travis Bradford.  He was lying in bed throughout encounter and somnolent other than occasional moaning and restlessness.  I met today with his son at the bedside. We discussed clinical course as well as wishes moving forward in regard to care plan this hospitalization.  We discussed difference between a aggressive medical intervention path and a palliative, comfort focused care path.  Values and goals of care important to patient and family were attempted to be elicited.  Travis Bradford reports that his father has always been very who is being clear when the time comes and he reached the end of the light aggressive interventions by his current status and unresponsive but moaning in pain to be unacceptable.  We discussed care options including continuation of current interventions and reassessing his of medication for comfort versus transitioning to full comfort care.  Facility is clear that his father's desire, if he were to understand the situation, will be to focus on comfort and dignity moving forward as he approaches end-of-life.  Questions and concerns addressed.   PMT will continue to support holistically.  Recommendations: -DNR/DNI -Full comfort care -Pain/shortness of breath: Morphine as needed -Anxiety: Ativan as needed -Agitation: Haldol as needed -Secretions: Robinul as needed -Discussed options for care outside the hospital with support of hospice.  His son would like to work to transition him to residential hospice for end-of-life care. -At this point, Travis Bradford is not waking up nor his he able to safely take p.o. intake.  He  appears to me to be transitioning to actively dying.  I believe his prognosis is likely less than 2 weeks and he does have symptoms that need aggressive management.  He would be best served by transition to residential hospice for end-of-life care for can be arranged.  Start time: 1700 End time:1740 Total time: 40 minutes  Greater than 50%  of this time was spent counseling and coordinating care related to the above assessment and plan.  Micheline Rough, MD [place] Team [phone]

## 2020-05-20 NOTE — Progress Notes (Signed)
Engineer, maintenance [facility]) Hospital Liaison note.     Received request from [place] RN  for family interest in [facility]. Patient information sent to [facility] for review, eligibility to be determined. [facility] is unable to offer a room today. Hospital Liaison will follow up tomorrow or sooner if a room becomes available and patient is eligible.     A Please do not hesitate to call with questions.     Thank you,    Farrel Gordon, RN, [facility]       [place] (listed on AMION under [place])     [phone]

## 2020-05-20 NOTE — TOC Progression Note (Signed)
Transition of Care [facility]) - Progression Note    Patient Details  Name: Travis Bradford MRN: [id number] Date of Birth: 09-21-1925  Transition of Care [facility]) CM/SW Contact  Purcell Mouton, RN Phone Number: 05/20/2020, 2:57 PM  Clinical Narrative:     Spoke with pt's son Travis Bradford who states that he would like [facility] which is [facility]. Waiting on MD for Home with Hospice or residential.  Referral given to [facility] in house rep who will continue to follow to see if pt will go home with [facility] or residential [facility].   Expected Discharge Plan: [place] Barriers to Discharge: No Barriers Identified  Expected Discharge Plan and Services Expected Discharge Plan: [place] arrangements for the past 2 months: Single Family Home                                       Social Determinants of Health (SDOH) Interventions    Readmission Risk Interventions No flowsheet data found.

## 2020-05-20 NOTE — Evaluation (Signed)
Clinical/Bedside Swallow Evaluation Patient Details  Name: Travis Bradford MRN: [id number] Date of Birth: 10-17-1925  Today's Date: 05/20/2020 Time: SLP Start Time (ACUTE ONLY): 65 SLP Stop Time (ACUTE ONLY): 1110 SLP Time Calculation (min) (ACUTE ONLY): 30 min  Past Medical History:  Past Medical History:  Diagnosis Date  . Abnormal stress test    a. 07/2012: abnormal stress echo showing hypokinesis of the mid-distal inferior wall suggestive of ischemia; + frequent PVCs, no ST changes or CP. Dr. [name] had a long discussion with the patient about further w/u versus medical therapy and the patient elected medical therapy..  . Arthritis   . Atrial fibrillation ([place])    a. Dx 01/2016.  [name] BPH (benign prostatic hyperplasia)   . Bradycardia   . Dizziness 05/11/2020  . History of blood transfusion   . Hyperlipidemia   . Hypothyroid   . Lymphoma ([place])    15 years ago  . Pneumonia   . Prostate cancer ([place])   . PVC's (premature ventricular contractions)    a. 07/2012 - seen on stress echo.  [name] RBBB    a. h/o intermittent rbbb.  . Restless legs syndrome   . Sinus bradycardia   . Sleep apnea    does not use a cpap   Past Surgical History:  Past Surgical History:  Procedure Laterality Date  . APPENDECTOMY    . Athroscopic knee surgery    . COLONOSCOPY    . dental implants    . GREEN LIGHT LASER TURP (TRANSURETHRAL RESECTION OF PROSTATE  08/29/2012   Procedure: GREEN LIGHT LASER TURP (TRANSURETHRAL RESECTION OF PROSTATE;  Surgeon: [name], MD;  Location: [facility];  Service: Urology;  Laterality: N/A;  . HEMATOMA EVACUATION Right 10/11/2019   Procedure: RIGHT LEG EVACUATION OF DEEP HEMATOMA;  Surgeon: [name], MD;  Location: [facility];  Service: Orthopedics;  Laterality: Right;  . MOHS SURGERY     x 6   HPI:  Travis Bradford is a 86 y.o. male with medical history significant of abnormal stress test in 2014 (managed conservatively), permanent atrial fibrillation, BPH, HLD,  hypothyroidism, lymphoma, prostate CA, PVCs, intermittent RBBB, sleep apnea (pt adamantly declines cpap), bradycardia, mildly dilated root/ascending aorta 2018, mild cardiomyopathy in 2017 (normal EF 2018), non-Hodgkin's lymphoma 20 years ago status post chemotherapy and radiation, carotid disease, traumatic hematoma of the thigh in 09/2019. He was recently admitted by [facility] 10-16/10-19/2. His medications were adjusted and he was able to be discharged home. During his hospital stay he did exhibit agitation in a setting of baseline dementia. Since discharge home his son reports rapid decline: loss of appetite, decreased general function, increased somnolence. There have been no falls, no injuries, no changes in the home environment.  CXR 10/24 concerning for pneumonia; CT 10/23 with no acute findings   Assessment / Plan / Recommendation Clinical Impression  Pt presents with severe oral dysphagia and clinical indicators of pharyngeal dysphagia.  Pt exhibited poor bolus awareness.  There was no oral response to ice chip or puree.  With thin liquid, it is suspected all bolus transit was passive in nature.  Pt was able initiate pharyngeal swallow reflex, but it was seemingly delayed with wet vocal quality noted and multiple swallows.  Pt does not have a history of recurrent pneumonia and son reports pt has had an excelled appetite and no difficulties with PO intake until just recently.  Dysphagia may be acute and related to recent hospitalization and deconditioning as well as possible acute delerium;  there is also a possibility, however, that it is a part of his dementia process.    At present, pt is not safe for PO intake.  SLP will continue to follow for PO readiness and/or readiness for instrumental swallow evaluation if indicated.  Temporary alternate means of nutrition may be considered if pt's agitation lessens.  SLP Visit Diagnosis: Dysphagia, unspecified (R13.10)    Aspiration Risk  Severe  aspiration risk;Moderate aspiration risk    Diet Recommendation NPO   Medication Administration: Via alternative means    Other  Recommendations Oral Care Recommendations: Oral care QID   Follow up Recommendations  (TBD)      Frequency and Duration min 2x/week  2 weeks       Prognosis Prognosis for Safe Diet Advancement: Fair      Swallow Study   General HPI: Travis Bradford is a 86 y.o. male with medical history significant of abnormal stress test in 2014 (managed conservatively), permanent atrial fibrillation, BPH, HLD, hypothyroidism, lymphoma, prostate CA, PVCs, intermittent RBBB, sleep apnea (pt adamantly declines cpap), bradycardia, mildly dilated root/ascending aorta 2018, mild cardiomyopathy in 2017 (normal EF 2018), non-Hodgkin's lymphoma 20 years ago status post chemotherapy and radiation, carotid disease, traumatic hematoma of the thigh in 09/2019. He was recently admitted by [facility] 10-16/10-19/2. His medications were adjusted and he was able to be discharged home. During his hospital stay he did exhibit agitation in a setting of baseline dementia. Since discharge home his son reports rapid decline: loss of appetite, decreased general function, increased somnolence. There have been no falls, no injuries, no changes in the home environment.  CXR 10/24 concerning for pneumonia; CT 10/23 with no acute findings Type of Study: Bedside Swallow Evaluation Previous Swallow Assessment: 2015 clinical evaluation, grossly WFL Diet Prior to this Study: Regular;Thin liquids Respiratory Status: Nasal cannula History of Recent Intubation: No Behavior/Cognition: Alert;Agitated;Confused Oral Cavity Assessment: Dried secretions Oral Care Completed by SLP: Yes Oral Cavity - Dentition: Adequate natural dentition Self-Feeding Abilities: Total assist Patient Positioning: Upright in bed Baseline Vocal Quality: Normal Volitional Cough: Cognitively unable to elicit Volitional Swallow:  Unable to elicit    Oral/Motor/Sensory Function Overall Oral Motor/Sensory Function:  (Unable to test)   Ice Chips Ice chips: Impaired Oral Phase Impairments: Poor awareness of bolus (Absent oral response) Other Comments: removed with digital sweep   Thin Liquid Thin Liquid: Impaired Presentation: Spoon;Straw Oral Phase Impairments: Reduced labial seal;Poor awareness of bolus Oral Phase Functional Implications: Right anterior spillage Pharyngeal  Phase Impairments: Suspected delayed Swallow;Multiple swallows;Wet Vocal Quality    Nectar Thick Nectar Thick Liquid: Not tested   Honey Thick Honey Thick Liquid: Not tested   Puree Puree: Impaired Oral Phase Impairments: Poor awareness of bolus Oral Phase Functional Implications:  (absent oral response) Other Comments: removed with suction   Solid     Solid: Not tested      Celedonio Savage, [place], [place] Office: [phone] 05/20/2020,11:27 AM

## 2020-05-21 ENCOUNTER — Ambulatory Visit: Payer: Medicare HMO | Admitting: Cardiology

## 2020-05-21 ENCOUNTER — Telehealth: Payer: Self-pay | Admitting: Cardiology

## 2020-05-21 DIAGNOSIS — J189 Pneumonia, unspecified organism: Secondary | ICD-10-CM | POA: Diagnosis not present

## 2020-05-21 DIAGNOSIS — Z515 Encounter for palliative care: Secondary | ICD-10-CM | POA: Diagnosis not present

## 2020-05-21 DIAGNOSIS — Z7189 Other specified counseling: Secondary | ICD-10-CM | POA: Diagnosis not present

## 2020-05-21 DIAGNOSIS — Z66 Do not resuscitate: Secondary | ICD-10-CM | POA: Diagnosis not present

## 2020-05-21 MED ORDER — HYDROMORPHONE HCL 1 MG/ML IJ SOLN
1.0000 mg | INTRAMUSCULAR | Status: DC | PRN
  Administered 2020-05-21 – 2020-05-25 (×19): 1 mg via INTRAVENOUS
  Filled 2020-05-21 (×19): qty 1

## 2020-05-21 NOTE — Progress Notes (Addendum)
Manufacturing engineer [facility]) Hospital Liaison Note:  Update on [place] bed availability: Unfortunately [facility] is not able to offer a room today.  An Barrister's clerk will follow up with TOC and family tomorrow or sooner if room becomes available.   Please do not hesitate to call with questions.  Thank you.   Gar Ponto, RN [facility] Liaison  [place] are on AMION  UPDATE:   Spoke to son [name] who has now decided to go home with hospice and confirmed interest in using [facility]. Made TOC aware who will follow up with family and update [facility] HLT on plan of care.

## 2020-05-21 NOTE — TOC Progression Note (Signed)
Transition of Care [facility]) - Progression Note    Patient Details  Name: Travis Bradford MRN: [id number] Date of Birth: [birth year]  Transition of Care [facility]) CM/SW Contact  Purcell Mouton, RN Phone Number: 05/21/2020, 3:32 PM  Clinical Narrative:     Pt was started on Dilaudid IV, and on [place] list.   Expected Discharge Plan: [place] Barriers to Discharge: No Barriers Identified  Expected Discharge Plan and Services Expected Discharge Plan: [place] arrangements for the past 2 months: Single Family Home                                       Social Determinants of Health (SDOH) Interventions    Readmission Risk Interventions No flowsheet data found.

## 2020-05-21 NOTE — Progress Notes (Signed)
Morphine 2 mg IV PRN effective at keeping patient calm this shift.

## 2020-05-21 NOTE — Progress Notes (Signed)
Daily Progress Note   Patient Name: Taysean Wager       Date: 05/21/2020 DOB: 1926-03-08  Age: 86 y.o. MRN#: [id number] Attending Physician: Elodia [place]., * Primary Care Physician: Patient, No Pcp Per Admit Date: 05/05/2020  Reason for Consultation/Follow-up: Establishing goals of care  Subjective:  not awake not alert, agitated restless at times, labored breathing at times. Hands in mittens, son at bedside.   Length of Stay: 3  Current Medications: Scheduled Meds:  . sodium chloride flush  3 mL Intravenous Q12H    Continuous Infusions: . sodium chloride      PRN Meds: sodium chloride, acetaminophen **OR** acetaminophen, antiseptic oral rinse, glycopyrrolate **OR** glycopyrrolate **OR** glycopyrrolate, haloperidol **OR** haloperidol **OR** haloperidol lactate, LORazepam **OR** LORazepam **OR** LORazepam, morphine injection, ondansetron **OR** ondansetron (ZOFRAN) IV, polyvinyl alcohol, sodium chloride flush  Physical Exam         Shallow breathing Not awake not alert S1 S 2  Abdomen is not tender No edema Feet cool to touch, no mottling  Vital Signs: BP 118/71 (BP Location: Right Arm)   Pulse 84   Temp 99.5 F (37.5 C) (Axillary)   Resp 20   Ht 5\' 7"  (1.702 m)   Wt 97.8 kg   SpO2 93%   BMI 33.77 kg/m  SpO2: SpO2: 93 % O2 Device: O2 Device: Nasal Cannula O2 Flow Rate: O2 Flow Rate (L/min): 2 L/min  Intake/output summary:   Intake/Output Summary (Last 24 hours) at 05/21/2020 1244 Last data filed at 05/21/2020 3267 Gross per 24 hour  Intake 824.51 ml  Output 400 ml  Net 424.51 ml   LBM: Last BM Date: 05/19/20 Baseline Weight: Weight: 97.8 kg Most recent weight: Weight: 97.8 kg       Palliative Assessment/Data: PPS 10%   Flowsheet Rows      Most Recent Value  Intake Tab  Referral Department Hospitalist  Unit at Time of Referral Cardiac/Telemetry Unit  Palliative Care Primary Diagnosis Cardiac  Date Notified 05/08/2020  Palliative Care Type New Palliative care  Reason for referral Clarify Goals of Care  Date of Admission 05/19/2020  Date first seen by Palliative Care 05/19/20  # of days Palliative referral response time 1 Day(s)  # of days IP prior to Palliative referral 0  Clinical Assessment  Palliative Performance Scale Score 40%  Psychosocial & Spiritual Assessment  Palliative Care Outcomes  Patient/Family meeting held? Yes  Who was at the meeting? son  Palliative Care Outcomes Clarified goals of care, Counseled regarding hospice, Provided psychosocial or spiritual support, Transitioned to hospice      Patient Active Problem List   Diagnosis Date Noted  . Goals of care, counseling/discussion   . Palliative care by specialist   . DNR (do not resuscitate)   . HAP (hospital-acquired pneumonia) 05/26/2020  . Gait instability 05/14/2020  . Elevated blood-pressure reading without diagnosis of hypertension 05/14/2020  . Sleep apnea 05/14/2020  . Atrial fibrillation with slow ventricular response ([place]) 05/11/2020  . Dizziness 05/11/2020  . Fall 05/11/2020  . Traumatic hematoma of right thigh 10/11/2019  . Periodic limb movement sleep disorder 03/26/2016  . Snoring 02/06/2016  . Daytime hypersomnolence 02/06/2016  . Periodic limb movement disorder (PLMD) 02/06/2016  . Hx of recurrent pneumonia 02/06/2016  . HCAP (healthcare-associated pneumonia) 12/20/2013  . UTI (lower urinary tract infection) 12/20/2013  . Nonspecific abnormal electrocardiogram (ECG) (EKG) 12/10/2013  . Leukocytosis 12/09/2013  . Sinus tachycardia 12/09/2013  . Respiratory distress 12/09/2013  . Hypotension 12/09/2013  . Septic shock ([place]) 12/09/2013  . Hypothyroidism 12/09/2013  . Restless leg syndrome 12/09/2013  . Arthritis   . CAP  (community acquired pneumonia) 12/08/2013  . Diabetes mellitus with renal manifestation ([place]) 12/14/2012  . Anemia 12/14/2012  . Nonspecific abnormal unspecified cardiovascular function study 10/18/2012  . Bradycardia 07/13/2012  . Hyperlipidemia 07/13/2012    Palliative Care Assessment & Plan   Patient Profile:    Assessment:  acute metabolic encephalopathy, dementia Aspiration PNA HFpEF AKI   Recommendations/Plan:   goals of care and disposition discussions, end of life signs and symptoms discussions, life review and medical history discussions with the patient's son who is at bedside: Opioid rotate to IV Dilaudid and monitor Continue comfort care Residential hospice when bed available.    Goals of Care and Additional Recommendations:  Limitations on Scope of Treatment: Full Comfort Care  Code Status:    Code Status Orders  (From admission, onward)         Start     Ordered   05/20/20 1738  Do not attempt resuscitation (DNR)  Continuous       Question Answer Comment  In the event of cardiac or respiratory ARREST Do not call a "code blue"   In the event of cardiac or respiratory ARREST Do not perform Intubation, CPR, defibrillation or ACLS   In the event of cardiac or respiratory ARREST Use medication by any route, position, wound care, and other measures to relive pain and suffering. May use oxygen, suction and manual treatment of airway obstruction as needed for comfort.      05/20/20 1738        Code Status History    Date Active Date Inactive Code Status Order ID Comments User Context   05/07/2020 2029 05/20/2020 1738 DNR [id number]  Neena Rhymes, MD ED   05/11/2020 1839 05/14/2020 1548 DNR [id number]  Renae Fickle, MD ED   10/11/2019 1655 10/12/2019 1838 Full Code [id number]  Elmon Else Inpatient   12/20/2013 1524 12/22/2013 1904 Full Code [id number]  Mendel Corning, MD Inpatient   12/09/2013 0909 12/11/2013 1643 Full Code [id number]  Annita Brod, MD Inpatient   Advance Care Planning Activity       Prognosis:   Hours - Days  Discharge Planning:  Hospice facility  Care plan was discussed with  Patient, bedside RN, son [name] at bedside, [place] MD Dr. Florene Glen.   Thank you for allowing the Palliative Medicine Team to assist in the care of this patient.   Time In: 12 Time Out: 12.35 Total Time 35 Prolonged Time Billed  no       Greater than 50%  of this time was spent counseling and coordinating care related to the above assessment and plan.  Loistine Chance, MD  Please contact Palliative Medicine Team phone at [phone] for questions and concerns.

## 2020-05-21 NOTE — Progress Notes (Signed)
PROGRESS NOTE    Travis Bradford  CWC:[id number] DOB: 05-30-1926 DOA: 05/11/2020 PCP: Patient, No Pcp Per   Chief Complaint  Patient presents with  . Chills  . Fatigue    Brief Narrative: Travis Bradford is Travis Bradford 86 y.o. male with medical history significant of abnormal stress test in 2014 (managed conservatively), permanent atrial fibrillation, BPH, HLD, hypothyroidism, lymphoma, prostate CA, PVCs, intermittent RBBB, sleep apnea (ptadamantly declinescpap), bradycardia, mildly dilated root/ascending aorta 2018, mild cardiomyopathy in 2017 (normal EF 2018),non-Hodgkin's lymphoma 20 years ago status post chemotherapy and radiation, carotid disease, traumatic hematoma of the thigh in 09/2019. He was recently admitted by [facility] 10-16/10-19/21 after Travis Bradford fall and was found to be in chronic Travis Kelemen. Fib with slow ventricular response. His medications were adjusted - CCB stopped, repinirole decreased and he was able to be discharged home. During his hospital stay he did exhibit agitation in Grigor Lipschutz setting of baseline dementia.  Since discharge home his son reports rapid decline: loss of appetite, decreased general function, increased somnolence. There have been no falls, no injuries, no changes in the home environment. Travis Bradford has always been irrascible and difficult. He has refused to establish with [facility] PCP, he has refused CPAP for severe OSA. The son raised the question of rapidly progressive dementia along with possible depression.  Due to his rapid decline he presents to [facility] for evaluation.  He was admitted for aspiration pneumonia complicated by delirium.  Plan at this point is for inpatient hospice.   Assessment & Plan:   Active Problems:   Hyperlipidemia   Diabetes mellitus with renal manifestation (HCC)   Hypothyroidism   Restless leg syndrome   HCAP (healthcare-associated pneumonia)   HAP (hospital-acquired pneumonia)   Goals of care, counseling/discussion   Palliative care by specialist    DNR (do not resuscitate)  Goals of Care Plan for inpatient hospice when bed available Comfort meds per palliative   Acute Metabolic Encephalopathy  Dementia  Depression Likely 2/2 aspiration pneumonia and hospitalization Had period of lucidity this morning, but persistent restlessness/agitation, may have been rallying.  Discussed with son, plan for inpatient hospice. Comfort meds as above  HCAP:  CXR 10/24 with RUL pneumonia CXR 10/25 with RUL pneumonia - recommending outpatient follow up Continue ceftriaxone (10/23 -25),  Azithromycin 10/23-25. Abx now d/c'd with comfort care  Ileus: Large BM 10/24, improved, imaging improved, negative kub today KUB 10/23 with dilated gas filled small bowel loobs, early obstruction vs ileus  HFpEF Doesn't appear grossly overloaded, elevated BNP Echo 10/24 with EF 55-60%, basal to mid inferior wall hypokinesis (see report), mildly reduced RVSF Hold additional lasix, hold losartan Strict I/O, daily weights  AKI Gentle IVF  T2DM:  SSI  Hypothyroidism: Continue home meds  OSA He's refused to wear cpap Nocturnal O2, continue to monitor  Goals of Care Comfort measures only  DVT prophylaxis: continue eliquis Code Status: DNR Family Communication: son at bedside Disposition:   Status is: Inpatient  Remains inpatient appropriate because:Inpatient level of care appropriate due to severity of illness   Dispo: The patient is from: Home              Anticipated d/c is to: pending              Anticipated d/c date is: > 3 days              Patient currently is not medically stable to d/c.  Consultants:   Palliative care  Procedures:  echo IMPRESSIONS  1. The basal to mid inferior wall appears hypokinetic. EF ~55-60% which  is largely unchanged from prior study. The windows were quite poor on this  study, and the patient was non-compliant with the exam as documented by  the sonographer. Would recommend  to repeat Angline Schweigert  limited study with contrast when the patient is cooperative.  Left ventricular ejection fraction, by estimation, is 55 to 60%. The left  ventricle has normal function. The left ventricle demonstrates regional  wall motion abnormalities (see  scoring diagram/findings for description). Left ventricular diastolic  function could not be evaluated.  2. Right ventricular systolic function is mildly reduced. The right  ventricular size is moderately enlarged. There is normal pulmonary artery  systolic pressure. The estimated right ventricular systolic pressure is  09.9 mmHg.  3. Left atrial size was mild to moderately dilated.  4. Right atrial size was mildly dilated.  5. The mitral valve is degenerative. No evidence of mitral valve  regurgitation. No evidence of mitral stenosis.  6. The aortic valve is calcified. Aortic valve regurgitation is not  visualized. Mild to moderate aortic valve sclerosis/calcification is  present, without any evidence of aortic stenosis.  7. The inferior vena cava is normal in size with <50% respiratory  variability, suggesting right atrial pressure of 8 mmHg.   FINDINGS  Left Ventricle: The basal to mid inferior wall appears hypokinetic. EF  ~55-60% which is largely unchanged from prior study. The windows were  quite poor on this study, and the patient was non-compliant with the exam  as documented by the sonographer.  Would recommend to repeat Jamine Highfill limited study with contrast when the patient  is cooperative. Left ventricular ejection fraction, by estimation, is 55  to 60%. The left ventricle has normal function. The left ventricle  demonstrates regional wall motion  abnormalities. The left ventricular internal cavity size was normal in  size. There is no left ventricular hypertrophy. Abnormal (paradoxical)  septal motion, consistent with left bundle branch block. Left ventricular  diastolic function could not be  evaluated due to atrial fibrillation. Left  ventricular diastolic function  could not be evaluated.   Antimicrobials: Anti-infectives (From admission, onward)   Start     Dose/Rate Route Frequency Ordered Stop   05/21/20 1000  doxycycline (VIBRA-TABS) tablet 100 mg  Status:  Discontinued        100 mg Oral Every 12 hours 05/20/20 1012 05/20/20 1114   05/21/20 1000  doxycycline (VIBRAMYCIN) 100 mg in sodium chloride 0.9 % 250 mL IVPB  Status:  Discontinued        100 mg 125 mL/hr over 120 Minutes Intravenous Every 12 hours 05/20/20 1114 05/20/20 1736   05/19/20 0600  cefTRIAXone (ROCEPHIN) 2 g in sodium chloride 0.9 % 100 mL IVPB  Status:  Discontinued        2 g 200 mL/hr over 30 Minutes Intravenous Every 24 hours 05/03/2020 2029 05/20/20 1736   05/16/2020 2130  azithromycin (ZITHROMAX) tablet 500 mg  Status:  Discontinued        500 mg Oral Daily 05/05/2020 2029 05/20/20 1012   05/17/2020 1915  vancomycin (VANCOREADY) IVPB 1500 mg/300 mL        1,500 mg 150 mL/hr over 120 Minutes Intravenous  Once 05/17/2020 1902 05/23/2020 2238   05/17/2020 1830  ceFEPIme (MAXIPIME) 1 g in sodium chloride 0.9 % 100 mL IVPB        1 g 200 mL/hr over 30 Minutes Intravenous  Once 05/17/2020 1825 05/23/2020 1928  Subjective: Initially sleeping appeared comfortable Able to wake up Travis Bradford bit and say he didn't feel well Long conversation with son at bedside  Objective: Vitals:   05/20/20 0620 05/20/20 1425 05/20/20 2237 05/21/20 1349  BP: 107/65 116/64 118/71 127/72  Pulse: 70 84  70  Resp: 20 20 20 18   Temp: 98.2 F (36.8 C) 98.3 F (36.8 C) 99.5 F (37.5 C) (!) 97.5 F (36.4 C)  TempSrc: Oral Oral Axillary Oral  SpO2: 93% 94% 93% 95%  Weight:      Height:        Intake/Output Summary (Last 24 hours) at 05/21/2020 1410 Last data filed at 05/21/2020 1300 Gross per 24 hour  Intake 824.51 ml  Output 400 ml  Net 424.51 ml   Filed Weights   05/19/20 0053  Weight: 97.8 kg    Examination:  General: No acute distress.  Appears more comfortable  today. Lungs: unlabored Neurological: Alert and disoriented. Moves all extremities 4. Cranial nerves II through XII grossly intact. Skin: Warm and dry. No rashes or lesions. Extremities: No clubbing or cyanosis. No edema.  Data Reviewed: I have personally reviewed following labs and imaging studies  CBC: Recent Labs  Lab 05/19/2020 1518 05/20/20 0428  WBC 8.7 9.3  NEUTROABS 6.1 7.2  HGB 12.4* 14.0  HCT 39.1 41.7  MCV 102.1* 98.6  PLT 246 034    Basic Metabolic Panel: Recent Labs  Lab 05/23/2020 1518 05/19/20 0511 05/20/20 0428  NA 135 132* 134*  K 3.9 3.7 3.5  CL 99 96* 97*  CO2 26 23 23   GLUCOSE 108* 170* 149*  BUN 19 23 33*  CREATININE 1.00 1.12 1.38*  CALCIUM 8.8* 8.5* 8.6*  MG  --   --  2.1  PHOS  --   --  3.2    GFR: Estimated Creatinine Clearance: 38.9 mL/min (Khristian Seals) (by C-G formula based on SCr of 1.38 mg/dL (H)).  Liver Function Tests: Recent Labs  Lab 05/02/2020 1518 05/20/20 0428  AST 21 26  ALT 17 18  ALKPHOS 62 58  BILITOT 0.8 0.8  PROT 6.8 7.3  ALBUMIN 3.6 3.7    CBG: Recent Labs  Lab 05/19/20 1632 05/19/20 2205 05/20/20 0753 05/20/20 1202 05/20/20 1619  GLUCAP 149* 120* 123* 122* 124*     Recent Results (from the past 240 hour(s))  Resp Panel by RT PCR (RSV, Flu Travis Bradford&B, Covid) - Nasopharyngeal Swab     Status: None   Collection Time: 05/11/20  5:15 PM   Specimen: Nasopharyngeal Swab  Result Value Ref Range Status   SARS Coronavirus 2 by RT PCR NEGATIVE NEGATIVE Final    Comment: (NOTE) SARS-CoV-2 target nucleic acids are NOT DETECTED.  The SARS-CoV-2 RNA is generally detectable in upper respiratoy specimens during the acute phase of infection. The lowest concentration of SARS-CoV-2 viral copies this assay can detect is 131 copies/mL. Travis Bradford negative result does not preclude SARS-Cov-2 infection and should not be used as the sole basis for treatment or other patient management decisions. Travis Bradford negative result may occur with  improper specimen  collection/handling, submission of specimen other than nasopharyngeal swab, presence of viral mutation(s) within the areas targeted by this assay, and inadequate number of viral copies (<131 copies/mL). Travis Bradford negative result must be combined with clinical observations, patient history, and epidemiological information. The expected result is Negative.  Fact Sheet for Patients:  PinkCheek.be  Fact Sheet for Healthcare Providers:  GravelBags.it  This test is no t yet approved or cleared by the  [place] Architectural technologist and  has been authorized for detection and/or diagnosis of SARS-CoV-2 by FDA under an Print production planner (EUA). This EUA will remain  in effect (meaning this test can be used) for the duration of the COVID-19 declaration under Section 564(b)(1) of the Act, 21 U.S.C. section 360bbb-3(b)(1), unless the authorization is terminated or revoked sooner.     Influenza Satin Boal by PCR NEGATIVE NEGATIVE Final   Influenza B by PCR NEGATIVE NEGATIVE Final    Comment: (NOTE) The Xpert Xpress SARS-CoV-2/FLU/RSV assay is intended as an aid in  the diagnosis of influenza from Nasopharyngeal swab specimens and  should not be used as Travis Bradford sole basis for treatment. Nasal washings and  aspirates are unacceptable for Xpert Xpress SARS-CoV-2/FLU/RSV  testing.  Fact Sheet for Patients: PinkCheek.be  Fact Sheet for Healthcare Providers: GravelBags.it  This test is not yet approved or cleared by the [place] FDA and  has been authorized for detection and/or diagnosis of SARS-CoV-2 by  FDA under an Emergency Use Authorization (EUA). This EUA will remain  in effect (meaning this test can be used) for the duration of the  Covid-19 declaration under Section 564(b)(1) of the Act, 21  U.S.C. section 360bbb-3(b)(1), unless the authorization is  terminated or revoked.    Respiratory  Syncytial Virus by PCR NEGATIVE NEGATIVE Final    Comment: (NOTE) Fact Sheet for Patients: PinkCheek.be  Fact Sheet for Healthcare Providers: GravelBags.it  This test is not yet approved or cleared by the [place] FDA and  has been authorized for detection and/or diagnosis of SARS-CoV-2 by  FDA under an Emergency Use Authorization (EUA). This EUA will remain  in effect (meaning this test can be used) for the duration of the  COVID-19 declaration under Section 564(b)(1) of the Act, 21 U.S.C.  section 360bbb-3(b)(1), unless the authorization is terminated or  revoked. Performed at [facility], [place] [address], [place], [place] [address]   Culture, blood (routine x 2)     Status: None (Preliminary result)   Collection Time: 05/21/2020  3:45 PM   Specimen: BLOOD  Result Value Ref Range Status   Specimen Description   Final    BLOOD LEFT UPPER ARM Performed at [facility] [address] [place], [place] [address]    Special Requests   Final    BOTTLES DRAWN AEROBIC AND ANAEROBIC Blood Culture adequate volume Performed at [facility] [address] [place], [place] [address]    Culture   Final    NO GROWTH 3 DAYS Performed at [facility], [place] [address], [place], [place] [address]    Report Status PENDING  Incomplete  Culture, blood (routine x 2)     Status: None (Preliminary result)   Collection Time: 05/21/2020  3:57 PM   Specimen: BLOOD  Result Value Ref Range Status   Specimen Description   Final    BLOOD LEFT ANTECUBITAL Performed at [facility] [address] [place], [place] [address]    Special Requests   Final    BOTTLES DRAWN AEROBIC ONLY Blood Culture results may not be optimal due to an excessive volume of blood received in culture bottles Performed at [facility] [address] [place], [place] [address]     Culture   Final    NO GROWTH 3 DAYS Performed at [facility], [place] [address] [place], [place] [address]    Report Status PENDING  Incomplete  Respiratory Panel by RT PCR (Flu Ofilia Rayon&B, Covid) - Nasopharyngeal Swab     Status: None   Collection Time: 04/30/2020  4:35 PM   Specimen: Nasopharyngeal Swab  Result Value Ref Range Status   SARS Coronavirus 2 by RT PCR NEGATIVE NEGATIVE Final    Comment: (NOTE) SARS-CoV-2 target nucleic acids are NOT DETECTED.  The SARS-CoV-2 RNA is generally detectable in upper respiratoy specimens during the acute phase of infection. The lowest concentration of SARS-CoV-2 viral copies this assay can detect is 131 copies/mL. Travis Bradford negative result does not preclude SARS-Cov-2 infection and should not be used as the sole basis for treatment or other patient management decisions. Travis Bradford negative result may occur with  improper specimen collection/handling, submission of specimen other than nasopharyngeal swab, presence of viral mutation(s) within the areas targeted by this assay, and inadequate number of viral copies (<131 copies/mL). Travis Bradford negative result must be combined with clinical observations, patient history, and epidemiological information. The expected result is Negative.  Fact Sheet for Patients:  PinkCheek.be  Fact Sheet for Healthcare Providers:  GravelBags.it  This test is no t yet approved or cleared by the [place] FDA and  has been authorized for detection and/or diagnosis of SARS-CoV-2 by FDA under an Emergency Use Authorization (EUA). This EUA will remain  in effect (meaning this test can be used) for the duration of the COVID-19 declaration under Section 564(b)(1) of the Act, 21 U.S.C. section 360bbb-3(b)(1), unless the authorization is terminated or revoked sooner.     Influenza Travis Bradford by PCR NEGATIVE NEGATIVE Final   Influenza B by PCR NEGATIVE NEGATIVE Final    Comment: (NOTE) The  Xpert Xpress SARS-CoV-2/FLU/RSV assay is intended as an aid in  the diagnosis of influenza from Nasopharyngeal swab specimens and  should not be used as Travis Bradford sole basis for treatment. Nasal washings and  aspirates are unacceptable for Xpert Xpress SARS-CoV-2/FLU/RSV  testing.  Fact Sheet for Patients: PinkCheek.be  Fact Sheet for Healthcare Providers: GravelBags.it  This test is not yet approved or cleared by the [place] FDA and  has been authorized for detection and/or diagnosis of SARS-CoV-2 by  FDA under an Emergency Use Authorization (EUA). This EUA will remain  in effect (meaning this test can be used) for the duration of the  Covid-19 declaration under Section 564(b)(1) of the Act, 21  U.S.C. section 360bbb-3(b)(1), unless the authorization is  terminated or revoked. Performed at [facility], [place] [address], [place], [place] [address]   Urine culture     Status: Abnormal   Collection Time: 04/30/2020  5:17 PM   Specimen: Urine, Clean Catch  Result Value Ref Range Status   Specimen Description   Final    URINE, CLEAN CATCH Performed at [facility], [place] [address] [place], [place] [address]    Special Requests   Final    NONE Performed at [facility], [place] [address], [place], [place] [address]    Culture (Travis Bradford)  Final    <10,000 COLONIES/mL INSIGNIFICANT GROWTH Performed at [facility] [address] [place], [place] [address]    Report Status 05/20/2020 FINAL  Final         Radiology Studies: DG CHEST PORT 1 VIEW  Result Date: 05/20/2020 CLINICAL DATA:  Altered mental status EXAM: PORTABLE CHEST 1 VIEW COMPARISON:  May 19, 2020 FINDINGS: Airspace opacity consistent with pneumonia is again noted in the right upper lobe. The lungs elsewhere are clear.  Heart is mildly enlarged with pulmonary vascularity normal. No adenopathy. There is  aortic atherosclerosis. No bone lesions. IMPRESSION: Airspace opacity consistent with pneumonia right upper lobe. Lungs elsewhere clear. Stable cardiac prominence. Aortic Atherosclerosis (ICD10-I70.0). Followup PA and lateral chest radiographs recommended in 3-4 weeks following trial of antibiotic therapy to ensure resolution and exclude underlying malignancy. Electronically Signed   By: Lowella Grip III M.D.   On: 05/20/2020 14:04        Scheduled Meds: . sodium chloride flush  3 mL Intravenous Q12H   Continuous Infusions: . sodium chloride       LOS: 3 days    Time spent: over 30 min    Fayrene Helper, MD [facility]   To contact the attending provider between 7A-7P or the covering provider during after hours 7P-7A, please log into the web site [email or web address] and access using universal [place] password for that web site. If you do not have the password, please call the hospital operator.  05/21/2020, 2:10 PM

## 2020-05-21 NOTE — Telephone Encounter (Signed)
Crystal with [facility] is calling because the patient is currently in the hospital. His family is requesting that he go to his home to pass. Beings that he does not have a PCP, Crystal wants to know if Dr. [name] would be the hospice attending and agree he is terminally ill. Please advise.

## 2020-05-21 NOTE — Progress Notes (Addendum)
Manufacturing engineer [facility]) Hospital Liaison Note:  [place] Referral received on 05/20/20. After speaking to patient son this morning - family has decided to go home with [facility] hospice services upon discharge from hospital  [facility] made aware. Hospice eligibility under review at this time.  Per son, Plan is to go back to his residence via ambulance with private caregiver once patient is medically stable to discharge.   Family requests hospital bed and [facility] will order.    Please send completed DNR and arrange for any comfort scripts that may be needed for symptom management so there is no lapse in patient comfort prior to hospice services beginning.  An [facility] HLT will follow patient until discharge from hospital, please reach out with any concerns or questions,  Thank you,   Gar Ponto, RN [facility] (in [place]) [phone]  UPDATE:  PER TOC, PATIENT IS BEING PLACED ON A DILAUDID DRIP AND DECLINING PER MD - [place]. PATIENT WILL REMAIN ON [facility] REFERRAL LIST PENDING PATIENT CONDITION/MAY NOT BE MEDICALLY STABLE TO TRANSFER. DISPOSITION TBD

## 2020-05-21 NOTE — Telephone Encounter (Signed)
Spoke to [employer] with patient placement.She stated patient is now going to [facility].

## 2020-05-21 NOTE — Plan of Care (Signed)
Pt morphine was discontinued. Pt is now on dilaudid noted. Pt is tolerating dilaudid. No s/s of sepsis at this time. Pt continue on 2 liters of oxygen via nasal cannula, noted. Problem: Education: Goal: Knowledge of General Education information will improve Description: Including pain rating scale, medication(s)/side effects and non-pharmacologic comfort measures Outcome: Progressing   Problem: Health Behavior/Discharge Planning: Goal: Ability to manage health-related needs will improve Outcome: Progressing   Problem: Clinical Measurements: Goal: Ability to maintain clinical measurements within normal limits will improve Outcome: Progressing Goal: Will remain free from infection Outcome: Progressing Goal: Diagnostic test results will improve Outcome: Progressing Goal: Cardiovascular complication will be avoided Outcome: Progressing   Problem: Activity: Goal: Risk for activity intolerance will decrease Outcome: Progressing   Problem: Nutrition: Goal: Adequate nutrition will be maintained Outcome: Progressing   Problem: Coping: Goal: Level of anxiety will decrease Outcome: Progressing   Problem: Elimination: Goal: Will not experience complications related to bowel motility Outcome: Progressing   Problem: Pain Managment: Goal: General experience of comfort will improve Outcome: Progressing   Problem: Safety: Goal: Ability to remain free from injury will improve Outcome: Progressing   Problem: Skin Integrity: Goal: Risk for impaired skin integrity will decrease Outcome: Progressing

## 2020-05-22 DIAGNOSIS — R531 Weakness: Secondary | ICD-10-CM

## 2020-05-22 DIAGNOSIS — Z7189 Other specified counseling: Secondary | ICD-10-CM | POA: Diagnosis not present

## 2020-05-22 DIAGNOSIS — Z515 Encounter for palliative care: Secondary | ICD-10-CM | POA: Diagnosis not present

## 2020-05-22 MED ORDER — ATROPINE SULFATE 1 % OP SOLN
1.0000 [drp] | Freq: Four times a day (QID) | OPHTHALMIC | Status: DC
  Administered 2020-05-22 – 2020-05-24 (×6): 1 [drp] via SUBLINGUAL
  Filled 2020-05-22: qty 2

## 2020-05-22 NOTE — Progress Notes (Signed)
PROGRESS NOTE    Travis Bradford  LDJ:[id number] DOB: 1925/09/19 DOA: 05/19/2020 PCP: Patient, No Pcp Per    Brief Narrative:  86 year old male with history of coronary artery disease, permanent A. fib, hyperlipidemia, hypothyroidism, lymphoma, prostate cancer and sleep apnea with recent worsening overall health brought to the hospital with loss of appetite, decreased general function and increasing somnolence with agitation.  Initially admitted for aspiration pneumonia complicated by delirium.  With poor improvement despite maximal medical therapy, started on comfort care and hospice.   Assessment & Plan:   Active Problems:   Hyperlipidemia   Diabetes mellitus with renal manifestation (HCC)   Hypothyroidism   Restless leg syndrome   HCAP (healthcare-associated pneumonia)   HAP (hospital-acquired pneumonia)   Goals of care, counseling/discussion   Palliative care by specialist   DNR (do not resuscitate)  Acute metabolic encephalopathy in the setting of underlying dementia and depression: Healthcare acquired pneumonia: Heart failure with preserved ejection fraction: Acute kidney injury Untreated sleep apnea Hypothyroidism Adult failure to thrive/agitation/delirium:  Plan: Patient at end-of-life.  Currently on comfort care. All comfort care medications available. Waiting for inpatient hospice bed, until then will provide comfort care in the hospital. Patient may die inpatient. RN to pronounce death if her course inpatient.   DVT prophylaxis: Comfort care   Code Status: Comfort care Family Communication: Patient's son at the bedside Disposition Plan: Status is: Inpatient  Remains inpatient appropriate because:Unsafe d/c plan   Dispo: The patient is from: Home              Anticipated d/c is to: Inpatient hospice              Anticipated d/c date is: When bed available              Patient currently is medically stable to d/c.  Patient is medically stable only to  transfer to inpatient hospice.  Otherwise hospital death is expected.    Consultants:   Palliative medicine  Procedures:   None  Antimicrobials:   None   Subjective: Patient seen and examined.  He was slightly agitated in the morning, was given a dose of Dilaudid and has been sleeping since then.  Patient's son was at the bedside.  We discussed about patient's symptom management and has been currently comfortable.  Objective: Vitals:   05/20/20 1425 05/20/20 2237 05/21/20 1349 05/22/20 0409  BP: 116/64 118/71 127/72 122/73  Pulse: 84  70 69  Resp: 20 20 18    Temp: 98.3 F (36.8 C) 99.5 F (37.5 C) (!) 97.5 F (36.4 C) 98.3 F (36.8 C)  TempSrc: Oral Axillary Oral Oral  SpO2: 94% 93% 95% 97%  Weight:      Height:        Intake/Output Summary (Last 24 hours) at 05/22/2020 1339 Last data filed at 05/21/2020 1700 Gross per 24 hour  Intake 0 ml  Output --  Net 0 ml   Filed Weights   05/19/20 0053  Weight: 97.8 kg    Examination:  Physical Exam Constitutional:      Comments: Sick looking gentleman, unable to interact.  Slightly agitated on stimulation otherwise sleepy.  HENT:     Head: Atraumatic.  Eyes:     Pupils: Pupils are equal, round, and reactive to light.  Cardiovascular:     Comments: Irregularly irregular.  Tachycardic. Pulmonary:     Comments: Mostly conducted airway sounds. Neurological:     Comments: Sleepy, intermittently agitated,  Data Reviewed: I have personally reviewed following labs and imaging studies  CBC: Recent Labs  Lab 05/04/2020 1518 05/20/20 0428  WBC 8.7 9.3  NEUTROABS 6.1 7.2  HGB 12.4* 14.0  HCT 39.1 41.7  MCV 102.1* 98.6  PLT 246 419   Basic Metabolic Panel: Recent Labs  Lab 05/09/2020 1518 05/19/20 0511 05/20/20 0428  NA 135 132* 134*  K 3.9 3.7 3.5  CL 99 96* 97*  CO2 26 23 23   GLUCOSE 108* 170* 149*  BUN 19 23 33*  CREATININE 1.00 1.12 1.38*  CALCIUM 8.8* 8.5* 8.6*  MG  --   --  2.1  PHOS   --   --  3.2   GFR: Estimated Creatinine Clearance: 38.9 mL/min (A) (by C-G formula based on SCr of 1.38 mg/dL (H)). Liver Function Tests: Recent Labs  Lab 05/05/2020 1518 05/20/20 0428  AST 21 26  ALT 17 18  ALKPHOS 62 58  BILITOT 0.8 0.8  PROT 6.8 7.3  ALBUMIN 3.6 3.7   No results for input(s): LIPASE, AMYLASE in the last 168 hours. No results for input(s): AMMONIA in the last 168 hours. Coagulation Profile: No results for input(s): INR, PROTIME in the last 168 hours. Cardiac Enzymes: No results for input(s): CKTOTAL, CKMB, CKMBINDEX, TROPONINI in the last 168 hours. BNP (last 3 results) No results for input(s): PROBNP in the last 8760 hours. HbA1C: No results for input(s): HGBA1C in the last 72 hours. CBG: Recent Labs  Lab 05/19/20 1632 05/19/20 2205 05/20/20 0753 05/20/20 1202 05/20/20 1619  GLUCAP 149* 120* 123* 122* 124*   Lipid Profile: No results for input(s): CHOL, HDL, LDLCALC, TRIG, CHOLHDL, LDLDIRECT in the last 72 hours. Thyroid Function Tests: No results for input(s): TSH, T4TOTAL, FREET4, T3FREE, THYROIDAB in the last 72 hours. Anemia Panel: No results for input(s): VITAMINB12, FOLATE, FERRITIN, TIBC, IRON, RETICCTPCT in the last 72 hours. Sepsis Labs: Recent Labs  Lab 05/14/2020 1428  LATICACIDVEN 0.8    Recent Results (from the past 240 hour(s))  Culture, blood (routine x 2)     Status: None (Preliminary result)   Collection Time: 05/07/2020  3:45 PM   Specimen: BLOOD  Result Value Ref Range Status   Specimen Description   Final    BLOOD LEFT UPPER ARM Performed at [facility], [place] [address], [place], [place] [address]    Special Requests   Final    BOTTLES DRAWN AEROBIC AND ANAEROBIC Blood Culture adequate volume Performed at [facility] [address], [place], [place] [address]    Culture   Final    NO GROWTH 4 DAYS Performed at [facility], [place] [address], [place], [place] [address]     Report Status PENDING  Incomplete  Culture, blood (routine x 2)     Status: None (Preliminary result)   Collection Time: 05/04/2020  3:57 PM   Specimen: BLOOD  Result Value Ref Range Status   Specimen Description   Final    BLOOD LEFT ANTECUBITAL Performed at [facility] [address], [place], [place] [address]    Special Requests   Final    BOTTLES DRAWN AEROBIC ONLY Blood Culture results may not be optimal due to an excessive volume of blood received in culture bottles Performed at [facility] [address], [place], [place] [address]    Culture   Final    NO GROWTH 4 DAYS Performed at [facility], [place]  [address] [place], [place] [address]    Report Status PENDING  Incomplete  Respiratory Panel by RT PCR (Flu A&B, Covid) - Nasopharyngeal Swab     Status: None   Collection Time: 05/10/2020  4:35 PM   Specimen: Nasopharyngeal Swab  Result Value Ref Range Status   SARS Coronavirus 2 by RT PCR NEGATIVE NEGATIVE Final    Comment: (NOTE) SARS-CoV-2 target nucleic acids are NOT DETECTED.  The SARS-CoV-2 RNA is generally detectable in upper respiratoy specimens during the acute phase of infection. The lowest concentration of SARS-CoV-2 viral copies this assay can detect is 131 copies/mL. A negative result does not preclude SARS-Cov-2 infection and should not be used as the sole basis for treatment or other patient management decisions. A negative result may occur with  improper specimen collection/handling, submission of specimen other than nasopharyngeal swab, presence of viral mutation(s) within the areas targeted by this assay, and inadequate number of viral copies (<131 copies/mL). A negative result must be combined with clinical observations, patient history, and epidemiological information. The expected result is Negative.  Fact Sheet for Patients:  PinkCheek.be  Fact Sheet for Healthcare  Providers:  GravelBags.it  This test is no t yet approved or cleared by the [place] FDA and  has been authorized for detection and/or diagnosis of SARS-CoV-2 by FDA under an Emergency Use Authorization (EUA). This EUA will remain  in effect (meaning this test can be used) for the duration of the COVID-19 declaration under Section 564(b)(1) of the Act, 21 U.S.C. section 360bbb-3(b)(1), unless the authorization is terminated or revoked sooner.     Influenza A by PCR NEGATIVE NEGATIVE Final   Influenza B by PCR NEGATIVE NEGATIVE Final    Comment: (NOTE) The Xpert Xpress SARS-CoV-2/FLU/RSV assay is intended as an aid in  the diagnosis of influenza from Nasopharyngeal swab specimens and  should not be used as a sole basis for treatment. Nasal washings and  aspirates are unacceptable for Xpert Xpress SARS-CoV-2/FLU/RSV  testing.  Fact Sheet for Patients: PinkCheek.be  Fact Sheet for Healthcare Providers: GravelBags.it  This test is not yet approved or cleared by the [place] FDA and  has been authorized for detection and/or diagnosis of SARS-CoV-2 by  FDA under an Emergency Use Authorization (EUA). This EUA will remain  in effect (meaning this test can be used) for the duration of the  Covid-19 declaration under Section 564(b)(1) of the Act, 21  U.S.C. section 360bbb-3(b)(1), unless the authorization is  terminated or revoked. Performed at [facility], [place] [address] [place], [place] [address]   Urine culture     Status: Abnormal   Collection Time: 05/20/2020  5:17 PM   Specimen: Urine, Clean Catch  Result Value Ref Range Status   Specimen Description   Final    URINE, CLEAN CATCH Performed at [facility], [place] [address] [place], [place] [address]    Special Requests   Final    NONE Performed at [facility], [place]  [address] [place], [place] [address]    Culture (A)  Final    <10,000 COLONIES/mL INSIGNIFICANT GROWTH Performed at [facility] [address] [place], [place] [address]    Report Status 05/20/2020 FINAL  Final         Radiology Studies: No results found.      Scheduled Meds: . atropine  1 drop Sublingual QID  . sodium chloride flush  3 mL Intravenous Q12H   Continuous Infusions: .  sodium chloride       LOS: 4 days    Time spent: 30 minutes    Barb Merino, MD [facility] Pager [phone]

## 2020-05-22 NOTE — Progress Notes (Signed)
[facility] ([facility]) Hospital Liaison note.    Unfortunately, [facility] is unable to offer a room today. Hospital Liaison will follow up tomorrow or sooner if a room becomes available.    Please do not hesitate to call with questions.  Thank you for the opportunity to participate in this patient's care.  Chrislyn King, BSN, RN [facility] Hospital Liaison (listed on [facility] under Hosp[facility])    [phone]     

## 2020-05-22 NOTE — Progress Notes (Signed)
Daily Progress Note   Patient Name: Watt Geiler       Date: 05/22/2020 DOB: 1926/01/13  Age: 86 y.o. MRN#: [id number] Attending Physician: Barb Merino, MD Primary Care Physician: Patient, No Pcp Per Admit Date: 05/26/2020  Reason for Consultation/Follow-up: Establishing goals of care  Subjective:  continues to be agitated at times, in mild distress off/on, discussed with bedside RN and son in the room.     Length of Stay: 4  Current Medications: Scheduled Meds:  . atropine  1 drop Sublingual QID  . sodium chloride flush  3 mL Intravenous Q12H    Continuous Infusions: . sodium chloride      PRN Meds: sodium chloride, acetaminophen **OR** acetaminophen, antiseptic oral rinse, glycopyrrolate **OR** glycopyrrolate **OR** glycopyrrolate, haloperidol **OR** haloperidol **OR** haloperidol lactate, HYDROmorphone (DILAUDID) injection, LORazepam **OR** LORazepam **OR** LORazepam, ondansetron **OR** ondansetron (ZOFRAN) IV, polyvinyl alcohol, sodium chloride flush  Physical Exam         Shallow breathing Not awake not alert S1 S 2  Abdomen is not tender No edema Feet cool to touch, no mottling  Vital Signs: BP 122/73 (BP Location: Right Arm)   Pulse 69   Temp 98.3 F (36.8 C) (Oral)   Resp 18   Ht 5\' 7"  (1.702 m)   Wt 97.8 kg   SpO2 97%   BMI 33.77 kg/m  SpO2: SpO2: 97 % O2 Device: O2 Device: Nasal Cannula O2 Flow Rate: O2 Flow Rate (L/min): 2 L/min  Intake/output summary:   Intake/Output Summary (Last 24 hours) at 05/22/2020 1310 Last data filed at 05/21/2020 1700 Gross per 24 hour  Intake 0 ml  Output --  Net 0 ml   LBM: Last BM Date: 05/19/20 Baseline Weight: Weight: 97.8 kg Most recent weight: Weight: 97.8 kg       Palliative Assessment/Data: PPS  10%   Flowsheet Rows     Most Recent Value  Intake Tab  Referral Department Hospitalist  Unit at Time of Referral Cardiac/Telemetry Unit  Palliative Care Primary Diagnosis Cardiac  Date Notified 05/12/2020  Palliative Care Type New Palliative care  Reason for referral Clarify Goals of Care  Date of Admission 05/16/2020  Date first seen by Palliative Care 05/19/20  # of days Palliative referral response time 1 Day(s)  # of days IP prior to Palliative referral 0  Clinical Assessment  Palliative Performance Scale Score 40%  Psychosocial & Spiritual Assessment  Palliative Care Outcomes  Patient/Family meeting held? Yes  Who was at the meeting? son  Palliative Care Outcomes Clarified goals of care, Counseled regarding hospice, Provided psychosocial or spiritual support, Transitioned to hospice      Patient Active Problem List   Diagnosis Date Noted  . Goals of care, counseling/discussion   . Palliative care by specialist   . DNR (do not resuscitate)   . HAP (hospital-acquired pneumonia) 04/26/2020  . Gait instability 05/14/2020  . Elevated blood-pressure reading without diagnosis of hypertension 05/14/2020  . Sleep apnea 05/14/2020  . Atrial fibrillation with slow ventricular response ([place]) 05/11/2020  . Dizziness 05/11/2020  . Fall 05/11/2020  . Traumatic hematoma of right thigh 10/11/2019  . Periodic limb movement sleep disorder 03/26/2016  . Snoring 02/06/2016  . Daytime hypersomnolence 02/06/2016  . Periodic limb movement disorder (PLMD) 02/06/2016  . Hx of recurrent pneumonia 02/06/2016  . HCAP (healthcare-associated pneumonia) 12/20/2013  . UTI (lower urinary tract infection) 12/20/2013  . Nonspecific abnormal electrocardiogram (ECG) (EKG) 12/10/2013  . Leukocytosis 12/09/2013  . Sinus tachycardia 12/09/2013  . Respiratory distress 12/09/2013  . Hypotension 12/09/2013  . Septic shock ([place]) 12/09/2013  . Hypothyroidism 12/09/2013  . Restless leg syndrome 12/09/2013   . Arthritis   . CAP (community acquired pneumonia) 12/08/2013  . Diabetes mellitus with renal manifestation ([place]) 12/14/2012  . Anemia 12/14/2012  . Nonspecific abnormal unspecified cardiovascular function study 10/18/2012  . Bradycardia 07/13/2012  . Hyperlipidemia 07/13/2012    Palliative Care Assessment & Plan   Patient Profile:    Assessment:  acute metabolic encephalopathy, dementia Aspiration PNA HFpEF AKI   Recommendations/Plan:   goals of care and disposition discussions, end of life signs and symptoms discussions, life review and medical history discussions with the patient's son who is at bedside: PRN IV Dilaudid and monitor Add Atropine drops for secretions.  Continue comfort care Residential hospice when bed available.    Goals of Care and Additional Recommendations:  Limitations on Scope of Treatment: Full Comfort Care  Code Status:    Code Status Orders  (From admission, onward)         Start     Ordered   05/20/20 1738  Do not attempt resuscitation (DNR)  Continuous       Question Answer Comment  In the event of cardiac or respiratory ARREST Do not call a "code blue"   In the event of cardiac or respiratory ARREST Do not perform Intubation, CPR, defibrillation or ACLS   In the event of cardiac or respiratory ARREST Use medication by any route, position, wound care, and other measures to relive pain and suffering. May use oxygen, suction and manual treatment of airway obstruction as needed for comfort.      05/20/20 1738        Code Status History    Date Active Date Inactive Code Status Order ID Comments User Context   05/01/2020 2029 05/20/2020 1738 DNR [id number]  Neena Rhymes, MD ED   05/11/2020 1839 05/14/2020 1548 DNR [id number]  Renae Fickle, MD ED   10/11/2019 1655 10/12/2019 1838 Full Code [id number]  Elmon Else Inpatient   12/20/2013 1524 12/22/2013 1904 Full Code [id number]  Mendel Corning, MD Inpatient   12/09/2013 0909  12/11/2013 1643 Full Code [id number]  Annita Brod, MD Inpatient   Advance Care Planning Activity       Prognosis:   Hours -  Days  Discharge Planning:  Hospice facility  Care plan was discussed with  Patient, bedside RN, son [name] at bedside,    Thank you for allowing the Palliative Medicine Team to assist in the care of this patient.   Time In: 11 Time Out: 11.25 Total Time 25 Prolonged Time Billed  no       Greater than 50%  of this time was spent counseling and coordinating care related to the above assessment and plan.  Loistine Chance, MD  Please contact Palliative Medicine Team phone at [phone] for questions and concerns.

## 2020-05-22 NOTE — Telephone Encounter (Signed)
Left message for Travis Bradford, dr [name] would like the hospice doctors to sign orders. She is to call back if needed.

## 2020-05-23 DIAGNOSIS — Z7189 Other specified counseling: Secondary | ICD-10-CM | POA: Diagnosis not present

## 2020-05-23 DIAGNOSIS — Z66 Do not resuscitate: Secondary | ICD-10-CM | POA: Diagnosis not present

## 2020-05-23 DIAGNOSIS — R0602 Shortness of breath: Secondary | ICD-10-CM | POA: Diagnosis not present

## 2020-05-23 LAB — CULTURE, BLOOD (ROUTINE X 2)
Culture: NO GROWTH
Culture: NO GROWTH
Special Requests: ADEQUATE

## 2020-05-23 MED ORDER — LIP MEDEX EX OINT
TOPICAL_OINTMENT | CUTANEOUS | Status: AC
  Filled 2020-05-23: qty 7

## 2020-05-23 NOTE — Progress Notes (Signed)
Daily Progress Note   Patient Name: Travis Bradford       Date: 05/23/2020 DOB: [birth year]  Age: 86 y.o. MRN#: [id number] Attending Physician: Barb Merino, MD Primary Care Physician: Patient, No Pcp Per Admit Date: 05/02/2020  Reason for Consultation/Follow-up: Establishing goals of care  Subjective:  patient overall appears comfortable, continues with comfort care, dying process.    Length of Stay: 5  Current Medications: Scheduled Meds:  . atropine  1 drop Sublingual QID  . sodium chloride flush  3 mL Intravenous Q12H    Continuous Infusions: . sodium chloride      PRN Meds: sodium chloride, acetaminophen **OR** acetaminophen, antiseptic oral rinse, glycopyrrolate **OR** glycopyrrolate **OR** glycopyrrolate, haloperidol **OR** haloperidol **OR** haloperidol lactate, HYDROmorphone (DILAUDID) injection, LORazepam **OR** LORazepam **OR** LORazepam, ondansetron **OR** ondansetron (ZOFRAN) IV, polyvinyl alcohol, sodium chloride flush  Physical Exam         Shallow breathing Not awake not alert S1 S 2  Abdomen is not tender No edema Feet cool to touch, no mottling  Vital Signs: BP 102/64 (BP Location: Right Arm)   Pulse 72   Temp 99.7 F (37.6 C) (Axillary)   Resp 15   Ht 5\' 7"  (1.702 m)   Wt 97.8 kg   SpO2 96%   BMI 33.77 kg/m  SpO2: SpO2: 96 % O2 Device: O2 Device: Nasal Cannula O2 Flow Rate: O2 Flow Rate (L/min): 2 L/min  Intake/output summary:   Intake/Output Summary (Last 24 hours) at 05/23/2020 1013 Last data filed at 05/23/2020 0600 Gross per 24 hour  Intake 0 ml  Output 1250 ml  Net -1250 ml   LBM: Last BM Date: 05/19/20 Baseline Weight: Weight: 97.8 kg Most recent weight: Weight: 97.8 kg       Palliative Assessment/Data: PPS 10%   Flowsheet  Rows     Most Recent Value  Intake Tab  Referral Department Hospitalist  Unit at Time of Referral Cardiac/Telemetry Unit  Palliative Care Primary Diagnosis Cardiac  Date Notified 04/26/2020  Palliative Care Type New Palliative care  Reason for referral Clarify Goals of Care  Date of Admission 05/10/2020  Date first seen by Palliative Care 05/19/20  # of days Palliative referral response time 1 Day(s)  # of days IP prior to Palliative referral 0  Clinical Assessment  Palliative Performance Scale Score 40%  Psychosocial & Spiritual Assessment  Palliative Care Outcomes  Patient/Family meeting held? Yes  Who was at the meeting? son  Palliative Care Outcomes Clarified goals of care, Counseled regarding hospice, Provided psychosocial or spiritual support, Transitioned to hospice      Patient Active Problem List   Diagnosis Date Noted  . Goals of care, counseling/discussion   . Palliative care by specialist   . DNR (do not resuscitate)   . HAP (hospital-acquired pneumonia) 05/21/2020  . Gait instability 05/14/2020  . Elevated blood-pressure reading without diagnosis of hypertension 05/14/2020  . Sleep apnea 05/14/2020  . Atrial fibrillation with slow ventricular response ([place]) 05/11/2020  . Dizziness 05/11/2020  . Fall 05/11/2020  . Traumatic hematoma of right thigh 10/11/2019  . Periodic limb movement sleep disorder 03/26/2016  . Snoring 02/06/2016  . Daytime hypersomnolence 02/06/2016  . Periodic limb movement disorder (PLMD) 02/06/2016  . Hx of recurrent pneumonia 02/06/2016  . HCAP (healthcare-associated pneumonia) 12/20/2013  . UTI (lower urinary tract infection) 12/20/2013  . Nonspecific abnormal electrocardiogram (ECG) (EKG) 12/10/2013  . Leukocytosis 12/09/2013  . Sinus tachycardia 12/09/2013  . Respiratory distress 12/09/2013  . Hypotension 12/09/2013  . Septic shock ([place]) 12/09/2013  . Hypothyroidism 12/09/2013  . Restless leg syndrome 12/09/2013  . Arthritis   .  CAP (community acquired pneumonia) 12/08/2013  . Diabetes mellitus with renal manifestation ([place]) 12/14/2012  . Anemia 12/14/2012  . Nonspecific abnormal unspecified cardiovascular function study 10/18/2012  . Bradycardia 07/13/2012  . Hyperlipidemia 07/13/2012    Palliative Care Assessment & Plan   Patient Profile:    Assessment:  acute metabolic encephalopathy, dementia Aspiration PNA HFpEF AKI   Recommendations/Plan:  used 6 mg total IV Dilaudid in the past 24 hours. Continue current comfort measures, touched base with hospice liaison, no bed at residential hospice today.   Goals of Care and Additional Recommendations:  Limitations on Scope of Treatment: Full Comfort Care  Code Status:    Code Status Orders  (From admission, onward)         Start     Ordered   05/20/20 1738  Do not attempt resuscitation (DNR)  Continuous       Question Answer Comment  In the event of cardiac or respiratory ARREST Do not call a "code blue"   In the event of cardiac or respiratory ARREST Do not perform Intubation, CPR, defibrillation or ACLS   In the event of cardiac or respiratory ARREST Use medication by any route, position, wound care, and other measures to relive pain and suffering. May use oxygen, suction and manual treatment of airway obstruction as needed for comfort.      05/20/20 1738        Code Status History    Date Active Date Inactive Code Status Order ID Comments User Context   05/23/2020 2029 05/20/2020 1738 DNR [id number]  Neena Rhymes, MD ED   05/11/2020 1839 05/14/2020 1548 DNR [id number]  Renae Fickle, MD ED   10/11/2019 1655 10/12/2019 1838 Full Code [id number]  Elmon Else Inpatient   12/20/2013 1524 12/22/2013 1904 Full Code [id number]  Mendel Corning, MD Inpatient   12/09/2013 0909 12/11/2013 1643 Full Code [id number]  Annita Brod, MD Inpatient   Advance Care Planning Activity       Prognosis:   Hours - Days  Discharge  Planning:  Hospice facility  Care plan was discussed with  IDT  Thank you for allowing the Palliative Medicine Team to assist in the care of this  patient.   Time In: 8 Time Out: 8.25 Total Time 25 Prolonged Time Billed  no       Greater than 50%  of this time was spent counseling and coordinating care related to the above assessment and plan.  Loistine Chance, MD  Please contact Palliative Medicine Team phone at [phone] for questions and concerns.

## 2020-05-23 NOTE — Progress Notes (Signed)
PROGRESS NOTE    Travis Bradford  YTK:[id number] DOB: 08-Dec-1925 DOA: 05/26/2020 PCP: Patient, No Pcp Per    Brief Narrative:  86 year old male with history of coronary artery disease, permanent A. fib, hyperlipidemia, hypothyroidism, lymphoma, prostate cancer and sleep apnea with recent worsening overall health brought to the hospital with loss of appetite, decreased general function and increasing somnolence with agitation.  Initially admitted for aspiration pneumonia complicated by delirium.  With poor improvement despite maximal medical therapy, started on comfort care and hospice. 10/28, no change in his status today.  No hospice bed available.  Patient may die in the hospital.   Assessment & Plan:   Active Problems:   Hyperlipidemia   Diabetes mellitus with renal manifestation (HCC)   Hypothyroidism   Restless leg syndrome   HCAP (healthcare-associated pneumonia)   HAP (hospital-acquired pneumonia)   Goals of care, counseling/discussion   Palliative care by specialist   DNR (do not resuscitate)  Acute metabolic encephalopathy in the setting of underlying dementia and depression: Healthcare acquired pneumonia: Heart failure with preserved ejection fraction: Acute kidney injury Untreated sleep apnea Hypothyroidism Adult failure to thrive/agitation/delirium:  Plan: Patient at end-of-life.  Currently on comfort care. All comfort care medications available. Waiting for inpatient hospice bed, until then will provide comfort care in the hospital. Patient may die inpatient. RN to pronounce death if her course inpatient.   DVT prophylaxis: Comfort care   Code Status: Comfort care Family Communication: Patient's son at the bedside Disposition Plan: Status is: Inpatient  Remains inpatient appropriate because:Unsafe d/c plan   Dispo: The patient is from: Home              Anticipated d/c is to: Inpatient hospice              Anticipated d/c date is: When bed available               Patient currently is medically stable to d/c.  Patient is medically stable only to transfer to inpatient hospice.  Otherwise hospital death is expected.    Consultants:   Palliative medicine  Procedures:   None  Antimicrobials:   None   Subjective: Seen and examined.  Medicated overnight for comfort with Ativan and Dilaudid.  Responded to name.  Could not keep up conversation. Objective: Vitals:   05/20/20 2237 05/21/20 1349 05/22/20 0409 05/22/20 2201  BP: 118/71 127/72 122/73 102/64  Pulse:  70 69 72  Resp: 20 18  15   Temp: 99.5 F (37.5 C) (!) 97.5 F (36.4 C) 98.3 F (36.8 C) 99.7 F (37.6 C)  TempSrc: Axillary Oral Oral Axillary  SpO2: 93% 95% 97% 96%  Weight:      Height:        Intake/Output Summary (Last 24 hours) at 05/23/2020 1048 Last data filed at 05/23/2020 0600 Gross per 24 hour  Intake 0 ml  Output 1250 ml  Net -1250 ml   Filed Weights   05/19/20 0053  Weight: 97.8 kg    Examination:  Physical Exam Constitutional:      Comments: Sick looking gentleman, unable to interact.  Responds to name with unintelligible voice.  HENT:     Head: Atraumatic.  Eyes:     Pupils: Pupils are equal, round, and reactive to light.  Cardiovascular:     Comments: Irregularly irregular.  Tachycardic. Pulmonary:     Comments: Mostly conducted airway sounds. Neurological:     Comments: Sleepy and obtunded.       Data Reviewed:  I have personally reviewed following labs and imaging studies  CBC: Recent Labs  Lab 05/24/2020 1518 05/20/20 0428  WBC 8.7 9.3  NEUTROABS 6.1 7.2  HGB 12.4* 14.0  HCT 39.1 41.7  MCV 102.1* 98.6  PLT 246 644   Basic Metabolic Panel: Recent Labs  Lab 05/19/2020 1518 05/19/20 0511 05/20/20 0428  NA 135 132* 134*  K 3.9 3.7 3.5  CL 99 96* 97*  CO2 26 23 23   GLUCOSE 108* 170* 149*  BUN 19 23 33*  CREATININE 1.00 1.12 1.38*  CALCIUM 8.8* 8.5* 8.6*  MG  --   --  2.1  PHOS  --   --  3.2   GFR: Estimated  Creatinine Clearance: 38.9 mL/min (A) (by C-G formula based on SCr of 1.38 mg/dL (H)). Liver Function Tests: Recent Labs  Lab 04/26/2020 1518 05/20/20 0428  AST 21 26  ALT 17 18  ALKPHOS 62 58  BILITOT 0.8 0.8  PROT 6.8 7.3  ALBUMIN 3.6 3.7   No results for input(s): LIPASE, AMYLASE in the last 168 hours. No results for input(s): AMMONIA in the last 168 hours. Coagulation Profile: No results for input(s): INR, PROTIME in the last 168 hours. Cardiac Enzymes: No results for input(s): CKTOTAL, CKMB, CKMBINDEX, TROPONINI in the last 168 hours. BNP (last 3 results) No results for input(s): PROBNP in the last 8760 hours. HbA1C: No results for input(s): HGBA1C in the last 72 hours. CBG: Recent Labs  Lab 05/19/20 1632 05/19/20 2205 05/20/20 0753 05/20/20 1202 05/20/20 1619  GLUCAP 149* 120* 123* 122* 124*   Lipid Profile: No results for input(s): CHOL, HDL, LDLCALC, TRIG, CHOLHDL, LDLDIRECT in the last 72 hours. Thyroid Function Tests: No results for input(s): TSH, T4TOTAL, FREET4, T3FREE, THYROIDAB in the last 72 hours. Anemia Panel: No results for input(s): VITAMINB12, FOLATE, FERRITIN, TIBC, IRON, RETICCTPCT in the last 72 hours. Sepsis Labs: Recent Labs  Lab 05/21/2020 1428  LATICACIDVEN 0.8    Recent Results (from the past 240 hour(s))  Culture, blood (routine x 2)     Status: None   Collection Time: 05/21/2020  3:45 PM   Specimen: BLOOD  Result Value Ref Range Status   Specimen Description   Final    BLOOD LEFT UPPER ARM Performed at [facility], [place] [address], [place], [place] [address]    Special Requests   Final    BOTTLES DRAWN AEROBIC AND ANAEROBIC Blood Culture adequate volume Performed at [place] [address] [place], [place] [address]    Culture   Final    NO GROWTH 5 DAYS Performed at [facility], [place] [address], [place], [place] [address]    Report Status 05/23/2020 FINAL  Final  Culture, blood  (routine x 2)     Status: None   Collection Time: 05/05/2020  3:57 PM   Specimen: BLOOD  Result Value Ref Range Status   Specimen Description   Final    BLOOD LEFT ANTECUBITAL Performed at [place] [address] [place], [place] [address]    Special Requests   Final    BOTTLES DRAWN AEROBIC ONLY Blood Culture results may not be optimal due to an excessive volume of blood received in culture bottles Performed at [place] [address], [place], [place] [address]    Culture   Final    NO GROWTH 5 DAYS Performed at [facility], [place] [address] [place],  [address]  Report Status 05/23/2020 FINAL  Final  Respiratory Panel by RT PCR (Flu A&B, Covid) - Nasopharyngeal Swab     Status: None   Collection Time: 05/01/2020  4:35 PM   Specimen: Nasopharyngeal Swab  Result Value Ref Range Status   SARS Coronavirus 2 by RT PCR NEGATIVE NEGATIVE Final    Comment: (NOTE) SARS-CoV-2 target nucleic acids are NOT DETECTED.  The SARS-CoV-2 RNA is generally detectable in upper respiratoy specimens during the acute phase of infection. The lowest concentration of SARS-CoV-2 viral copies this assay can detect is 131 copies/mL. A negative result does not preclude SARS-Cov-2 infection and should not be used as the sole basis for treatment or other patient management decisions. A negative result may occur with  improper specimen collection/handling, submission of specimen other than nasopharyngeal swab, presence of viral mutation(s) within the areas targeted by this assay, and inadequate number of viral copies (<131 copies/mL). A negative result must be combined with clinical observations, patient history, and epidemiological information. The expected result is Negative.  Fact Sheet for Patients:  PinkCheek.be  Fact Sheet for Healthcare Providers:  GravelBags.it  This test is no t  yet approved or cleared by the [place] FDA and  has been authorized for detection and/or diagnosis of SARS-CoV-2 by FDA under an Emergency Use Authorization (EUA). This EUA will remain  in effect (meaning this test can be used) for the duration of the COVID-19 declaration under Section 564(b)(1) of the Act, 21 U.S.C. section 360bbb-3(b)(1), unless the authorization is terminated or revoked sooner.     Influenza A by PCR NEGATIVE NEGATIVE Final   Influenza B by PCR NEGATIVE NEGATIVE Final    Comment: (NOTE) The Xpert Xpress SARS-CoV-2/FLU/RSV assay is intended as an aid in  the diagnosis of influenza from Nasopharyngeal swab specimens and  should not be used as a sole basis for treatment. Nasal washings and  aspirates are unacceptable for Xpert Xpress SARS-CoV-2/FLU/RSV  testing.  Fact Sheet for Patients: PinkCheek.be  Fact Sheet for Healthcare Providers: GravelBags.it  This test is not yet approved or cleared by the [place] FDA and  has been authorized for detection and/or diagnosis of SARS-CoV-2 by  FDA under an Emergency Use Authorization (EUA). This EUA will remain  in effect (meaning this test can be used) for the duration of the  Covid-19 declaration under Section 564(b)(1) of the Act, 21  U.S.C. section 360bbb-3(b)(1), unless the authorization is  terminated or revoked. Performed at [facility], [place] [address] [place], [place] [address]   Urine culture     Status: Abnormal   Collection Time: 05/22/2020  5:17 PM   Specimen: Urine, Clean Catch  Result Value Ref Range Status   Specimen Description   Final    URINE, CLEAN CATCH Performed at [facility], [place] [address] [place], [place] [address]    Special Requests   Final    NONE Performed at [facility], [place] [address], [place], [place] [address]    Culture (A)  Final    <10,000 COLONIES/mL  INSIGNIFICANT GROWTH Performed at [place] [address] [place], [place] [address]    Report Status 05/20/2020 FINAL  Final         Radiology Studies: No results found.      Scheduled Meds:  lip balm       atropine  1 drop Sublingual QID   sodium chloride flush  3 mL Intravenous Q12H   Continuous  Infusions:  sodium chloride       LOS: 5 days    Time spent: 30 minutes    Barb Merino, MD [facility] Pager [phone]

## 2020-05-23 NOTE — TOC Progression Note (Signed)
Transition of Care [facility]) - Progression Note    Patient Details  Name: Travis Bradford MRN: [id number] Date of Birth: [birth year]  Transition of Care [facility]) CM/SW Contact  Purcell Mouton, RN Phone Number: 05/23/2020, 11:31 AM  Clinical Narrative:    There are no [facility] [employer] available at present time.    Expected Discharge Plan: [place] Barriers to Discharge: No Barriers Identified  Expected Discharge Plan and Services Expected Discharge Plan: [employer] arrangements for the past 2 months: Single Family Home                                       Social Determinants of Health (SDOH) Interventions    Readmission Risk Interventions No flowsheet data found.

## 2020-05-23 NOTE — Progress Notes (Signed)
[facility] ([facility]) Hospital Liaison note.    [facility] is unable to offer a room today. Hospital Liaison will follow up tomorrow or sooner if a room becomes available.    Please do not hesitate to call with questions. Thank you for the opportunity to participate in this patient's care.  Chrislyn King, BSN, RN [facility] Hospital Liaison (listed on AMION under Hospice/[facility])    [phone]     

## 2020-05-24 DIAGNOSIS — Z7189 Other specified counseling: Secondary | ICD-10-CM | POA: Diagnosis not present

## 2020-05-24 DIAGNOSIS — R5383 Other fatigue: Secondary | ICD-10-CM

## 2020-05-24 DIAGNOSIS — Z66 Do not resuscitate: Secondary | ICD-10-CM | POA: Diagnosis not present

## 2020-05-24 DIAGNOSIS — Z515 Encounter for palliative care: Secondary | ICD-10-CM | POA: Diagnosis not present

## 2020-05-24 NOTE — Progress Notes (Signed)
Manufacturing engineer [facility]) Hospital Liaison note.    Unfortunately [facility] is unable to offer a room today. Hospital Liaison will follow up tomorrow or sooner if a room becomes available.  Please do not hesitate to call with questions.    Thank you for the opportunity to participate in this patients care.  Chrislyn Edison Pace, BSN, RN [facility] Liaison (listed on AMION under Hospice/[facility])    [phone]  (24h on call)

## 2020-05-24 NOTE — Progress Notes (Signed)
Patient seen and examined.  Son at the bedside.  He remains with swallow and irregular breathing.  Remains comfortable.  Medicated for comfort.  Still do not have any bed available at inpatient hospice. Hospital death anticipated if not transferred. All end-of-life care medications and support available.  Total time spent for patient care: 20 minutes

## 2020-05-24 NOTE — Progress Notes (Signed)
Daily Progress Note   Patient Name: Travis Bradford       Date: 05/24/2020 DOB: [birth year]  Age: 86 y.o. MRN#: [id number] Attending Physician: Barb Merino, MD Primary Care Physician: Patient, No Pcp Per Admit Date: 05/21/2020  Reason for Consultation/Follow-up: Establishing goals of care  Subjective:  patient overall appears comfortable, continues with comfort care, dying process. Has shallow breathing, some apneic pauses noted. No restless movements noted. Son at bedside, we talked about end of life signs and symptoms, patient's low grade temperature and differences between hospital based end of life care and the type of care that is provided in residential hospice.    Length of Stay: 6  Current Medications: Scheduled Meds:  . sodium chloride flush  3 mL Intravenous Q12H    Continuous Infusions: . sodium chloride      PRN Meds: sodium chloride, acetaminophen **OR** acetaminophen, antiseptic oral rinse, glycopyrrolate **OR** glycopyrrolate **OR** glycopyrrolate, haloperidol **OR** haloperidol **OR** haloperidol lactate, HYDROmorphone (DILAUDID) injection, LORazepam **OR** LORazepam **OR** LORazepam, ondansetron **OR** ondansetron (ZOFRAN) IV, polyvinyl alcohol, sodium chloride flush  Physical Exam         Shallow breathing Not awake not alert S1 S 2  Abdomen is not tender No edema Feet warm to touch, no mottling  Vital Signs: BP 113/62 (BP Location: Left Arm)   Pulse 76   Temp 99.9 F (37.7 C) (Axillary)   Resp 14   Ht 5\' 7"  (1.702 m)   Wt 97.8 kg   SpO2 94%   BMI 33.77 kg/m  SpO2: SpO2: 94 % O2 Device: O2 Device: Nasal Cannula O2 Flow Rate: O2 Flow Rate (L/min): 2 L/min  Intake/output summary:   Intake/Output Summary (Last 24 hours) at 05/24/2020 1222 Last data  filed at 05/24/2020 1059 Gross per 24 hour  Intake 3 ml  Output --  Net 3 ml   LBM: Last BM Date: 05/19/20 Baseline Weight: Weight: 97.8 kg Most recent weight: Weight: 97.8 kg       Palliative Assessment/Data: PPS 10%   Flowsheet Rows     Most Recent Value  Intake Tab  Referral Department Hospitalist  Unit at Time of Referral Cardiac/Telemetry Unit  Palliative Care Primary Diagnosis Cardiac  Date Notified 05/14/2020  Palliative Care Type New Palliative care  Reason for referral Clarify Goals of Care  Date of Admission  05/10/2020  Date first seen by Palliative Care 05/19/20  # of days Palliative referral response time 1 Day(s)  # of days IP prior to Palliative referral 0  Clinical Assessment  Palliative Performance Scale Score 40%  Psychosocial & Spiritual Assessment  Palliative Care Outcomes  Patient/Family meeting held? Yes  Who was at the meeting? son  Palliative Care Outcomes Clarified goals of care, Counseled regarding hospice, Provided psychosocial or spiritual support, Transitioned to hospice      Patient Active Problem List   Diagnosis Date Noted  . Goals of care, counseling/discussion   . Palliative care by specialist   . DNR (do not resuscitate)   . HAP (hospital-acquired pneumonia) 05/21/2020  . Gait instability 05/14/2020  . Elevated blood-pressure reading without diagnosis of hypertension 05/14/2020  . Sleep apnea 05/14/2020  . Atrial fibrillation with slow ventricular response ([place]) 05/11/2020  . Dizziness 05/11/2020  . Fall 05/11/2020  . Traumatic hematoma of right thigh 10/11/2019  . Periodic limb movement sleep disorder 03/26/2016  . Snoring 02/06/2016  . Daytime hypersomnolence 02/06/2016  . Periodic limb movement disorder (PLMD) 02/06/2016  . Hx of recurrent pneumonia 02/06/2016  . HCAP (healthcare-associated pneumonia) 12/20/2013  . UTI (lower urinary tract infection) 12/20/2013  . Nonspecific abnormal electrocardiogram (ECG) (EKG) 12/10/2013   . Leukocytosis 12/09/2013  . Sinus tachycardia 12/09/2013  . Respiratory distress 12/09/2013  . Hypotension 12/09/2013  . Septic shock ([place]) 12/09/2013  . Hypothyroidism 12/09/2013  . Restless leg syndrome 12/09/2013  . Arthritis   . CAP (community acquired pneumonia) 12/08/2013  . Diabetes mellitus with renal manifestation ([place]) 12/14/2012  . Anemia 12/14/2012  . Nonspecific abnormal unspecified cardiovascular function study 10/18/2012  . Bradycardia 07/13/2012  . Hyperlipidemia 07/13/2012    Palliative Care Assessment & Plan   Patient Profile:    Assessment:  acute metabolic encephalopathy, dementia Aspiration PNA HFpEF AKI   Recommendations/Plan:  used 6-7 mg total IV Dilaudid in the past 24 hours. Continue current comfort measures, touched base with hospice liaison, no bed at residential hospice today. Also discussed with son and TRH MD. Continue full scope of comfort measures.   Goals of Care and Additional Recommendations:  Limitations on Scope of Treatment: Full Comfort Care  Code Status:    Code Status Orders  (From admission, onward)         Start     Ordered   05/20/20 1738  Do not attempt resuscitation (DNR)  Continuous       Question Answer Comment  In the event of cardiac or respiratory ARREST Do not call a "code blue"   In the event of cardiac or respiratory ARREST Do not perform Intubation, CPR, defibrillation or ACLS   In the event of cardiac or respiratory ARREST Use medication by any route, position, wound care, and other measures to relive pain and suffering. May use oxygen, suction and manual treatment of airway obstruction as needed for comfort.      05/20/20 1738        Code Status History    Date Active Date Inactive Code Status Order ID Comments User Context   05/04/2020 2029 05/20/2020 1738 DNR [id number]  Neena Rhymes, MD ED   05/11/2020 1839 05/14/2020 1548 DNR [id number]  Renae Fickle, MD ED   10/11/2019 1655 10/12/2019 1838  Full Code [id number]  [place] Else Inpatient   12/20/2013 1524 12/22/2013 1904 Full Code [id number]  Mendel Corning, MD Inpatient   12/09/2013 0909 12/11/2013 1643 Full Code [id number]  [place] Pink,  Trenton Gammon, MD Inpatient   Advance Care Planning Activity       Prognosis:   Hours - Days  Discharge Planning:  Hospice facility Versus anticipated hospital death.  Care plan was discussed with  IDT  Thank you for allowing the Palliative Medicine Team to assist in the care of this patient.   Time In: 11 Time Out: 11.25 Total Time 25 Prolonged Time Billed  no       Greater than 50%  of this time was spent counseling and coordinating care related to the above assessment and plan.  Loistine Chance, MD  Please contact Palliative Medicine Team phone at [phone] for questions and concerns.

## 2020-05-25 DIAGNOSIS — Z66 Do not resuscitate: Secondary | ICD-10-CM | POA: Diagnosis not present

## 2020-05-25 DIAGNOSIS — Z7189 Other specified counseling: Secondary | ICD-10-CM | POA: Diagnosis not present

## 2020-05-25 DIAGNOSIS — Z515 Encounter for palliative care: Secondary | ICD-10-CM | POA: Diagnosis not present

## 2020-05-25 DIAGNOSIS — R5383 Other fatigue: Secondary | ICD-10-CM | POA: Diagnosis not present

## 2020-05-27 NOTE — Progress Notes (Signed)
[facility] ([facility]) Hospital Liaison note.   Received request from TOC manager for family interest in [facility]. [facility] is unable to offer a room today.  Hospital Liaison will follow up tomorrow or sooner if a room becomes available.  Please do not hesitate to call with questions.   Thank you,  Melissa O'Bryant, BSN, RN  [facility] Hospital Liaison (listed on AMION under [facility] [place])   [phone]   

## 2020-05-27 NOTE — Death Summary Note (Signed)
DEATH SUMMARY   Patient Details  Name: Travis Bradford MRN: [id number] DOB: 1926-02-27  Admission/Discharge Information   Admit Date:  06/05/2020  Date of Death:  June 12, 2020  Time of Death:  1714/11/03  Length of Stay: 7  Referring Physician: Patient, No Pcp Per   Reason(s) for Hospitalization  altered mental status   Diagnoses  Preliminary cause of death:  Secondary Diagnoses (including complications and co-morbidities):  Active Problems:   Hyperlipidemia   Diabetes mellitus with renal manifestation (HCC)   Hypothyroidism   Restless leg syndrome   HCAP (healthcare-associated pneumonia)   HAP (hospital-acquired pneumonia)   Goals of care, counseling/discussion   Palliative care by specialist   DNR (do not resuscitate)   Fatigue   Brief Hospital Course (including significant findings, care, treatment, and services provided and events leading to death)  Travis Bradford is a 86 y.o. year old male who has history of coronary artery disease, permanent A. fib, hyperlipidemia, hypothyroidism, lymphoma, prostate cancer and untreated sleep apnea brought to the hospital with loss of appetite, decrease in oral intake and increasing somnolence and agitation.  He was initially admitted for aspiration pneumonia complicated by delirium.  With overall poor improvement despite maximal medical therapy he was a started on comfort care and hospice. Patient was provided end-of-life care and ultimately died in the hospital.  Acute metabolic encephalopathy in the setting of underlying dementia Healthcare acquired pneumonia, aspiration pneumonia. Sleep apnea untreated Adult failure to thrive/agitation and delirium    Pertinent Labs and Studies  Significant Diagnostic Studies DG Abd 1 View  Result Date: 05/19/2020 CLINICAL DATA:  Ileus. EXAM: ABDOMEN - 1 VIEW COMPARISON:  06-05-20. FINDINGS: The bowel gas pattern is normal. No radio-opaque calculi or other significant radiographic  abnormality are seen. IMPRESSION: Negative. Electronically Signed   By: Marijo Conception M.D.   On: 05/19/2020 13:56   DG Abd 1 View  Result Date: 06-05-20 CLINICAL DATA:  Chills and malaise starting yesterday. EXAM: ABDOMEN - 1 VIEW COMPARISON:  Chest 06-05-20 FINDINGS: Gas and stool throughout the colon without distention. Mid abdominal small bowel are prominent with some dilated gas-filled small bowel loops in the left mid abdomen. Findings may represent early small bowel obstruction. Ileus would be a another possibility. Calcifications in the right upper quadrant consistent with gallstones. Cardiac enlargement. Degenerative changes in the spine and hips. IMPRESSION: Prominent mid abdominal small bowel with some dilated gas-filled small bowel loops in the left mid abdomen. Findings may represent early small bowel obstruction versus ileus. Electronically Signed   By: Lucienne Capers M.D.   On: June 05, 2020 21:44   CT Head Wo Contrast  Result Date: 06-05-2020 CLINICAL DATA:  Altered mental status after fall 1 week ago EXAM: CT HEAD WITHOUT CONTRAST TECHNIQUE: Contiguous axial images were obtained from the base of the skull through the vertex without intravenous contrast. COMPARISON:  05/11/2020 FINDINGS: Brain: No evidence of acute infarction, hemorrhage, hydrocephalus, extra-axial collection or mass lesion/mass effect. Mild low-density changes within the periventricular and subcortical white matter compatible with chronic microvascular ischemic change. Moderate diffuse cerebral volume loss. Vascular: Atherosclerotic calcifications involving the large vessels of the skull base. No unexpected hyperdense vessel. Skull: Normal. Negative for fracture or focal lesion. Sinuses/Orbits: Unchanged including probable retention cyst in the left sphenoid sinus. Mastoid air cells are clear. Orbital structures unremarkable. Other: None. IMPRESSION: 1. No acute intracranial findings.  Stable exam from 05/11/2020 2.  Chronic microvascular ischemic change and cerebral volume loss. Electronically Signed   By: Hart Carwin  Plundo D.O.   On: 05/02/2020 18:59   CT Head Wo Contrast  Result Date: 05/11/2020 CLINICAL DATA:  Pain following trauma EXAM: CT HEAD WITHOUT CONTRAST TECHNIQUE: Contiguous axial images were obtained from the base of the skull through the vertex without intravenous contrast. COMPARISON:  None. FINDINGS: Brain: There is mild diffuse atrophy. There is no intracranial mass, hemorrhage, extra-axial fluid collection, or midline shift. There is slight small vessel disease in the centra semiovale bilaterally. No acute infarct is evident. Vascular: No evident hyperdense vessel. There is calcification in each carotid siphon region. Skull: Bony calvarium appears intact. Sinuses/Orbits: There is a retention cyst in the anterior left sphenoid sinus. There is mucosal thickening in several ethmoid air cells. There is rightward deviation of the nasal septum. The orbits appear symmetric bilaterally. Other: Mastoid air cells are clear. IMPRESSION: Atrophy with slight periventricular small vessel disease. No mass or hemorrhage. No acute infarct. There are foci of arterial vascular calcification. There are foci of paranasal sinus disease. There is deviation of the nasal septum. Electronically Signed   By: Lowella Grip III M.D.   On: 05/11/2020 15:06   CT Cervical Spine Wo Contrast  Result Date: 05/11/2020 CLINICAL DATA:  Pain following fall.  Unsteady gait EXAM: CT CERVICAL SPINE WITHOUT CONTRAST TECHNIQUE: Multidetector CT imaging of the cervical spine was performed without intravenous contrast. Multiplanar CT image reconstructions were also generated. COMPARISON:  None. FINDINGS: Alignment: There is no appreciable spondylolisthesis. Skull base and vertebrae: The skull base and craniocervical junction regions appear normal. No evident acute fracture. No blastic or lytic bone lesions. There are cystic areas in the  odontoid region. Soft tissues and spinal canal: Prevertebral soft tissues and predental space regions are normal. There is no evident cord or canal hematoma. No paraspinous lesions are appreciable. Disc levels: There is moderately severe disc space narrowing at C3-4, C5-6, C6-7, and C7-T1. There is facet hypertrophy to varying degrees at all levels. There is impression on the exiting nerve root with relative nerve root effacement at C4-5 on the left and at C5-6 on the right. No frank disc extrusion or stenosis evident. Upper chest: Visualized upper lung regions are clear. Other: There are foci of subclavian and carotid artery calcification bilaterally. Retention cyst noted in anterior left sphenoid sinus. IMPRESSION: No appreciable fracture or spondylolisthesis. Extensive multifocal arthropathy. Impression on exiting nerve roots due to bony hypertrophy at C4-5 on the left at C5-6 on the right. No frank disc extrusion or stenosis. Foci of subclavian and carotid artery calcification bilaterally noted. Electronically Signed   By: Lowella Grip III M.D.   On: 05/11/2020 15:15   DG CHEST PORT 1 VIEW  Result Date: 05/20/2020 CLINICAL DATA:  Altered mental status EXAM: PORTABLE CHEST 1 VIEW COMPARISON:  May 19, 2020 FINDINGS: Airspace opacity consistent with pneumonia is again noted in the right upper lobe. The lungs elsewhere are clear. Heart is mildly enlarged with pulmonary vascularity normal. No adenopathy. There is aortic atherosclerosis. No bone lesions. IMPRESSION: Airspace opacity consistent with pneumonia right upper lobe. Lungs elsewhere clear. Stable cardiac prominence. Aortic Atherosclerosis (ICD10-I70.0). Followup PA and lateral chest radiographs recommended in 3-4 weeks following trial of antibiotic therapy to ensure resolution and exclude underlying malignancy. Electronically Signed   By: Lowella Grip III M.D.   On: 05/20/2020 14:04   Portable chest 1 View  Result Date:  05/19/2020 CLINICAL DATA:  Follow-up pneumonia. EXAM: PORTABLE CHEST 1 VIEW COMPARISON:  05/12/2020 and prior radiographs FINDINGS: Cardiomegaly again noted. Increased RIGHT  UPPER lobe airspace disease/pneumonia noted. Mild interstitial opacities are again noted. No pneumothorax or large pleural effusion noted. No acute bony abnormalities are present. IMPRESSION: Increased RIGHT UPPER lobe airspace disease/pneumonia. Electronically Signed   By: Margarette Canada M.D.   On: 05/19/2020 12:40   DG Chest Portable 1 View  Result Date: 05/13/2020 CLINICAL DATA:  Altered mental status and chills for 2 days EXAM: PORTABLE CHEST 1 VIEW COMPARISON:  02/06/2016 FINDINGS: Cardiac shadow is enlarged. Increased central vascular congestion is noted. Patchy increased density is noted in the right apex consistent with early infiltrate. Mild interstitial edema is noted. No bony abnormality is noted. IMPRESSION: Early infiltrate in the right apex. Mild CHF. Electronically Signed   By: Inez Catalina M.D.   On: 05/02/2020 15:49   ECHOCARDIOGRAM COMPLETE  Result Date: 05/19/2020    ECHOCARDIOGRAM REPORT   Patient Name:   Travis Bradford Date of Exam: 05/19/2020 Medical Rec #:  [id number]      Height:       67.0 in Accession #:    [id number]     Weight:       215.6 lb Date of Birth:  09/28/1925      BSA:          2.088 m Patient Age:    84 years       BP:           124/70 mmHg Patient Gender: M              HR:           75 bpm. Exam Location:  Inpatient Procedure: 2D Echo, Color Doppler and Cardiac Doppler Indications:    U38.45 Acute systolic (congestive) heart failure  History:        Patient has prior history of Echocardiogram examinations, most                 recent 04/20/2017. Arrythmias:Atrial Fibrillation; Risk                 Factors:Diabetes, Dyslipidemia and Sleep Apnea.  Sonographer:    Raquel Sarna Senior RDCS Referring Phys: [place] Comments: Technically difficult study due to poor echo windows. Patient  has been very agitated, obtained pertinent information as quickly as possible. IMPRESSIONS  1. The basal to mid inferior wall appears hypokinetic. EF ~55-60% which is largely unchanged from prior study. The windows were quite poor on this study, and the patient was non-compliant with the exam as documented by the sonographer. Would recommend to repeat a limited study with contrast when the patient is cooperative. Left ventricular ejection fraction, by estimation, is 55 to 60%. The left ventricle has normal function. The left ventricle demonstrates regional wall motion abnormalities (see scoring diagram/findings for description). Left ventricular diastolic function could not be evaluated.  2. Right ventricular systolic function is mildly reduced. The right ventricular size is moderately enlarged. There is normal pulmonary artery systolic pressure. The estimated right ventricular systolic pressure is 36.4 mmHg.  3. Left atrial size was mild to moderately dilated.  4. Right atrial size was mildly dilated.  5. The mitral valve is degenerative. No evidence of mitral valve regurgitation. No evidence of mitral stenosis.  6. The aortic valve is calcified. Aortic valve regurgitation is not visualized. Mild to moderate aortic valve sclerosis/calcification is present, without any evidence of aortic stenosis.  7. The inferior vena cava is normal in size with <50% respiratory variability, suggesting right atrial pressure of 8 mmHg. FINDINGS  Left Ventricle: The basal to mid inferior wall appears hypokinetic. EF ~55-60% which is largely unchanged from prior study. The windows were quite poor on this study, and the patient was non-compliant with the exam as documented by the sonographer. Would recommend to repeat a limited study with contrast when the patient is cooperative. Left ventricular ejection fraction, by estimation, is 55 to 60%. The left ventricle has normal function. The left ventricle demonstrates regional wall motion  abnormalities. The left ventricular internal cavity size was normal in size. There is no left ventricular hypertrophy. Abnormal (paradoxical) septal motion, consistent with left bundle branch block. Left ventricular diastolic function could not be evaluated due to atrial fibrillation. Left ventricular diastolic function could not be evaluated.  LV Wall Scoring: The inferior wall is hypokinetic. Right Ventricle: The right ventricular size is moderately enlarged. No increase in right ventricular wall thickness. Right ventricular systolic function is mildly reduced. There is normal pulmonary artery systolic pressure. The tricuspid regurgitant velocity is 1.90 m/s, and with an assumed right atrial pressure of 8 mmHg, the estimated right ventricular systolic pressure is 25.4 mmHg. Left Atrium: Left atrial size was mild to moderately dilated. Right Atrium: Right atrial size was mildly dilated. Pericardium: Trivial pericardial effusion is present. Mitral Valve: The mitral valve is degenerative in appearance. No evidence of mitral valve regurgitation. No evidence of mitral valve stenosis. Tricuspid Valve: The tricuspid valve is grossly normal. Tricuspid valve regurgitation is trivial. No evidence of tricuspid stenosis. Aortic Valve: The aortic valve is calcified. Aortic valve regurgitation is not visualized. Mild to moderate aortic valve sclerosis/calcification is present, without any evidence of aortic stenosis. Pulmonic Valve: The pulmonic valve was not well visualized. Pulmonic valve regurgitation is not visualized. Aorta: The aortic root is normal in size and structure. Venous: The inferior vena cava is normal in size with less than 50% respiratory variability, suggesting right atrial pressure of 8 mmHg. IAS/Shunts: The interatrial septum was not well visualized.  LEFT VENTRICLE PLAX 2D LVIDd:         4.20 cm      Diastology LVIDs:         3.20 cm      LV e' medial:    7.18 cm/s LV PW:         1.20 cm      LV E/e'  medial:  11.3 LV IVS:        1.00 cm      LV e' lateral:   8.27 cm/s LVOT diam:     2.60 cm      LV E/e' lateral: 9.8 LV SV:         77 LV SV Index:   37 LVOT Area:     5.31 cm  LV Volumes (MOD) LV vol d, MOD A4C: 142.0 ml LV vol s, MOD A4C: 68.4 ml LV SV MOD A4C:     142.0 ml RIGHT VENTRICLE RV S prime:     12.00 cm/s TAPSE (M-mode): 1.8 cm LEFT ATRIUM              Index       RIGHT ATRIUM           Index LA diam:        4.00 cm  1.92 cm/m  RA Area:     23.60 cm LA Vol (A2C):   106.0 ml 50.77 ml/m RA Volume:   74.40 ml  35.63 ml/m LA Vol (A4C):   66.0 ml  31.61 ml/m LA Biplane  Vol: 83.8 ml  40.14 ml/m  AORTIC VALVE LVOT Vmax:   90.72 cm/s LVOT Vmean:  58.100 cm/s LVOT VTI:    0.145 m  AORTA Ao Root diam: 3.90 cm MITRAL VALVE               TRICUSPID VALVE MV Area (PHT): 3.08 cm    TR Peak grad:   14.4 mmHg MV Decel Time: 246 msec    TR Vmax:        190.00 cm/s MV E velocity: 81.40 cm/s                            SHUNTS                            Systemic VTI:  0.14 m                            Systemic Diam: 2.60 cm Eleonore Chiquito MD Electronically signed by Eleonore Chiquito MD Signature Date/Time: 05/19/2020/10:45:31 AM    Final     Microbiology Recent Results (from the past 240 hour(s))  Culture, blood (routine x 2)     Status: None   Collection Time: 05/04/2020  3:45 PM   Specimen: BLOOD  Result Value Ref Range Status   Specimen Description   Final    BLOOD LEFT UPPER ARM Performed at [facility], [place] [address] [place], [place] [address]    Special Requests   Final    BOTTLES DRAWN AEROBIC AND ANAEROBIC Blood Culture adequate volume Performed at [place] [address] [place], [place] [address]    Culture   Final    NO GROWTH 5 DAYS Performed at [facility], [place] [address], [place], [place] [address]    Report Status 05/23/2020 FINAL  Final  Culture, blood (routine x 2)     Status: None   Collection Time: 05/02/2020  3:57 PM    Specimen: BLOOD  Result Value Ref Range Status   Specimen Description   Final    BLOOD LEFT ANTECUBITAL Performed at [place] [address] [place], [place] [address]    Special Requests   Final    BOTTLES DRAWN AEROBIC ONLY Blood Culture results may not be optimal due to an excessive volume of blood received in culture bottles Performed at [place] [address], [place], [place] [address]    Culture   Final    NO GROWTH 5 DAYS Performed at [facility], [place] [address] [place], [place] [address]    Report Status 05/23/2020 FINAL  Final  Respiratory Panel by RT PCR (Flu A&B, Covid) - Nasopharyngeal Swab     Status: None   Collection Time: 05/24/2020  4:35 PM   Specimen: Nasopharyngeal Swab  Result Value Ref Range Status   SARS Coronavirus 2 by RT PCR NEGATIVE NEGATIVE Final    Comment: (NOTE) SARS-CoV-2 target nucleic acids are NOT DETECTED.  The SARS-CoV-2 RNA is generally detectable in upper respiratoy specimens during the acute phase of infection. The lowest concentration of SARS-CoV-2 viral copies this assay can detect is 131 copies/mL. A negative result does not preclude SARS-Cov-2 infection and should not be used as the sole basis for treatment or other patient management decisions. A negative result may occur with  improper specimen  collection/handling, submission of specimen other than nasopharyngeal swab, presence of viral mutation(s) within the areas targeted by this assay, and inadequate number of viral copies (<131 copies/mL). A negative result must be combined with clinical observations, patient history, and epidemiological information. The expected result is Negative.  Fact Sheet for Patients:  PinkCheek.be  Fact Sheet for Healthcare Providers:  GravelBags.it  This test is no t yet approved or cleared by the [place] FDA and  has been  authorized for detection and/or diagnosis of SARS-CoV-2 by FDA under an Emergency Use Authorization (EUA). This EUA will remain  in effect (meaning this test can be used) for the duration of the COVID-19 declaration under Section 564(b)(1) of the Act, 21 U.S.C. section 360bbb-3(b)(1), unless the authorization is terminated or revoked sooner.     Influenza A by PCR NEGATIVE NEGATIVE Final   Influenza B by PCR NEGATIVE NEGATIVE Final    Comment: (NOTE) The Xpert Xpress SARS-CoV-2/FLU/RSV assay is intended as an aid in  the diagnosis of influenza from Nasopharyngeal swab specimens and  should not be used as a sole basis for treatment. Nasal washings and  aspirates are unacceptable for Xpert Xpress SARS-CoV-2/FLU/RSV  testing.  Fact Sheet for Patients: PinkCheek.be  Fact Sheet for Healthcare Providers: GravelBags.it  This test is not yet approved or cleared by the [place] FDA and  has been authorized for detection and/or diagnosis of SARS-CoV-2 by  FDA under an Emergency Use Authorization (EUA). This EUA will remain  in effect (meaning this test can be used) for the duration of the  Covid-19 declaration under Section 564(b)(1) of the Act, 21  U.S.C. section 360bbb-3(b)(1), unless the authorization is  terminated or revoked. Performed at [facility], [place] [address] [place], [place] [address]   Urine culture     Status: Abnormal   Collection Time: 05/02/2020  5:17 PM   Specimen: Urine, Clean Catch  Result Value Ref Range Status   Specimen Description   Final    URINE, CLEAN CATCH Performed at [facility], [place] [address], [place], [place] [address]    Special Requests   Final    NONE Performed at [facility], [place] [address] [place], [place] [address]    Culture (A)  Final    <10,000 COLONIES/mL INSIGNIFICANT GROWTH Performed at [place] [address] [place], [place] [address]    Report Status 05/20/2020 FINAL  Final    Lab Basic Metabolic Panel: Recent Labs  Lab 05/19/20 0511 05/20/20 0428  NA 132* 134*  K 3.7 3.5  CL 96* 97*  CO2 23 23  GLUCOSE 170* 149*  BUN 23 33*  CREATININE 1.12 1.38*  CALCIUM 8.5* 8.6*  MG  --  2.1  PHOS  --  3.2   Liver Function Tests: Recent Labs  Lab 05/20/20 0428  AST 26  ALT 18  ALKPHOS 58  BILITOT 0.8  PROT 7.3  ALBUMIN 3.7   No results for input(s): LIPASE, AMYLASE in the last 168 hours. No results for input(s): AMMONIA in the last 168 hours. CBC: Recent Labs  Lab 05/20/20 0428  WBC 9.3  NEUTROABS 7.2  HGB 14.0  HCT 41.7  MCV 98.6  PLT 261   Cardiac Enzymes: No results for input(s): CKTOTAL, CKMB, CKMBINDEX, TROPONINI in the last 168 hours. Sepsis Labs: Recent Labs  Lab 05/20/20 0428  WBC 9.3    Procedures/Operations     Travis Bradford 07-Jun-2020, 5:27 PM

## 2020-05-27 NOTE — Progress Notes (Signed)
Patient pronounced at 1716, family at bedside. Dr. Sloan Leiter notified.

## 2020-05-27 NOTE — Progress Notes (Signed)
Daily Progress Note   Patient Name: Travis Bradford       Date: 2020/06/04 DOB: Oct 10, 1925  Age: 86 y.o. MRN#: [id number] Attending Physician: Barb Merino, MD Primary Care Physician: Patient, No Pcp Per Admit Date: 04/30/2020  Reason for Consultation/Follow-up: Establishing goals of care  Subjective:  patient overall appears comfortable, continues with comfort care, dying process. Has rapid shallow breathing, some apneic pauses noted. No restless movements noted. Son at bedside, we talked about patient showing end of life signs and symptoms, and limited prognosis.    Length of Stay: 7  Current Medications: Scheduled Meds:  . sodium chloride flush  3 mL Intravenous Q12H    Continuous Infusions: . sodium chloride      PRN Meds: sodium chloride, acetaminophen **OR** acetaminophen, antiseptic oral rinse, glycopyrrolate **OR** glycopyrrolate **OR** glycopyrrolate, haloperidol **OR** haloperidol **OR** haloperidol lactate, HYDROmorphone (DILAUDID) injection, LORazepam **OR** LORazepam **OR** LORazepam, ondansetron **OR** ondansetron (ZOFRAN) IV, polyvinyl alcohol, sodium chloride flush  Physical Exam         Rapid Shallow breathing Not awake not alert S1 S 2  Abdomen is not tender No edema Feet cool to touch, no mottling  Vital Signs: BP 131/68 (BP Location: Left Arm)   Pulse 96   Temp (!) 101.1 F (38.4 C) (Axillary) Comment: family did not want tylenol administered  Resp 18   Ht 5\' 7"  (1.702 m)   Wt 97.8 kg   SpO2 (!) 89%   BMI 33.77 kg/m  SpO2: SpO2: (!) 89 % O2 Device: O2 Device: Nasal Cannula O2 Flow Rate: O2 Flow Rate (L/min): 3.5 L/min  Intake/output summary:   Intake/Output Summary (Last 24 hours) at June 04, 2020 1307 Last data filed at 04-Jun-2020 0600 Gross per  24 hour  Intake 0 ml  Output 675 ml  Net -675 ml   LBM: Last BM Date: 05/19/20 Baseline Weight: Weight: 97.8 kg Most recent weight: Weight: 97.8 kg       Palliative Assessment/Data: PPS 10%   Flowsheet Rows     Most Recent Value  Intake Tab  Referral Department Hospitalist  Unit at Time of Referral Cardiac/Telemetry Unit  Palliative Care Primary Diagnosis Cardiac  Date Notified 05/20/2020  Palliative Care Type New Palliative care  Reason for referral Clarify Goals of Care  Date of Admission 04/27/2020  Date first seen by Palliative  Care 05/19/20  # of days Palliative referral response time 1 Day(s)  # of days IP prior to Palliative referral 0  Clinical Assessment  Palliative Performance Scale Score 40%  Psychosocial & Spiritual Assessment  Palliative Care Outcomes  Patient/Family meeting held? Yes  Who was at the meeting? son  Palliative Care Outcomes Clarified goals of care, Counseled regarding hospice, Provided psychosocial or spiritual support, Transitioned to hospice      Patient Active Problem List   Diagnosis Date Noted  . Fatigue   . Goals of care, counseling/discussion   . Palliative care by specialist   . DNR (do not resuscitate)   . HAP (hospital-acquired pneumonia) 05/24/2020  . Gait instability 05/14/2020  . Elevated blood-pressure reading without diagnosis of hypertension 05/14/2020  . Sleep apnea 05/14/2020  . Atrial fibrillation with slow ventricular response ([place]) 05/11/2020  . Dizziness 05/11/2020  . Fall 05/11/2020  . Traumatic hematoma of right thigh 10/11/2019  . Periodic limb movement sleep disorder 03/26/2016  . Snoring 02/06/2016  . Daytime hypersomnolence 02/06/2016  . Periodic limb movement disorder (PLMD) 02/06/2016  . Hx of recurrent pneumonia 02/06/2016  . HCAP (healthcare-associated pneumonia) 12/20/2013  . UTI (lower urinary tract infection) 12/20/2013  . Nonspecific abnormal electrocardiogram (ECG) (EKG) 12/10/2013  . Leukocytosis  12/09/2013  . Sinus tachycardia 12/09/2013  . Respiratory distress 12/09/2013  . Hypotension 12/09/2013  . Septic shock ([place]) 12/09/2013  . Hypothyroidism 12/09/2013  . Restless leg syndrome 12/09/2013  . Arthritis   . CAP (community acquired pneumonia) 12/08/2013  . Diabetes mellitus with renal manifestation ([employer]) 12/14/2012  . Anemia 12/14/2012  . Nonspecific abnormal unspecified cardiovascular function study 10/18/2012  . Bradycardia 07/13/2012  . Hyperlipidemia 07/13/2012    Palliative Care Assessment & Plan   Patient Profile:    Assessment:  acute metabolic encephalopathy, dementia Aspiration PNA HFpEF AKI   Recommendations/Plan:  med history noted, continues on prn IV Dilaudid. Continue current comfort measures, anticipated hospital death  Goals of Care and Additional Recommendations:  Limitations on Scope of Treatment: Full Comfort Care  Code Status:    Code Status Orders  (From admission, onward)         Start     Ordered   05/20/20 1738  Do not attempt resuscitation (DNR)  Continuous       Question Answer Comment  In the event of cardiac or respiratory ARREST Do not call a "code blue"   In the event of cardiac or respiratory ARREST Do not perform Intubation, CPR, defibrillation or ACLS   In the event of cardiac or respiratory ARREST Use medication by any route, position, wound care, and other measures to relive pain and suffering. May use oxygen, suction and manual treatment of airway obstruction as needed for comfort.      05/20/20 1738        Code Status History    Date Active Date Inactive Code Status Order ID Comments User Context   05/13/2020 2029 05/20/2020 1738 DNR [id number]  Neena Rhymes, MD ED   05/11/2020 1839 05/14/2020 1548 DNR [id number]  Renae Fickle, MD ED   10/11/2019 1655 10/12/2019 1838 Full Code [id number]  Elmon Else Inpatient   12/20/2013 1524 12/22/2013 1904 Full Code [id number]  Mendel Corning, MD Inpatient    12/09/2013 0909 12/11/2013 1643 Full Code [id number]  Annita Brod, MD Inpatient   Advance Care Planning Activity       Prognosis:   Hours - Days  Discharge Planning:  Hospice facility  Versus anticipated hospital death.  Care plan was discussed with  IDT  Thank you for allowing the Palliative Medicine Team to assist in the care of this patient.   Time In: 11 Time Out: 11.25 Total Time 25 Prolonged Time Billed  no       Greater than 50%  of this time was spent counseling and coordinating care related to the above assessment and plan.  Loistine Chance, MD  Please contact Palliative Medicine Team phone at [phone] for questions and concerns.

## 2020-05-27 NOTE — Progress Notes (Signed)
Patient seen and examined.   He remains with swallow and irregular breathing with episodic apnea. Remains comfortable.  He is medicated for comfort.  Still do not have any bed available at inpatient hospice. Hospital death anticipated if not transferred. All end-of-life care medications and support available.  Total time spent for patient care: 20 minutes Updated patient's family at bedside.

## 2020-05-27 DEATH — deceased

## 2022-01-09 IMAGING — CT CT CERVICAL SPINE W/O CM
3 of 4 series · 13 of 33 positions shown, 16 images · non-contrast
Comparison: None.

CLINICAL DATA: Pain following fall.  Unsteady gait

EXAM:
CT CERVICAL SPINE WITHOUT CONTRAST
TECHNIQUE: Multidetector CT imaging of the cervical spine was performed without
intravenous contrast. Multiplanar CT image reconstructions were also
generated.

[Series 4: c_spine 2.0 st · axial · 0.49mm/px · z∈[-228,-94]mm · 5 of 95 slices shown, 7 images]
[im 14/95  soft-tissue]
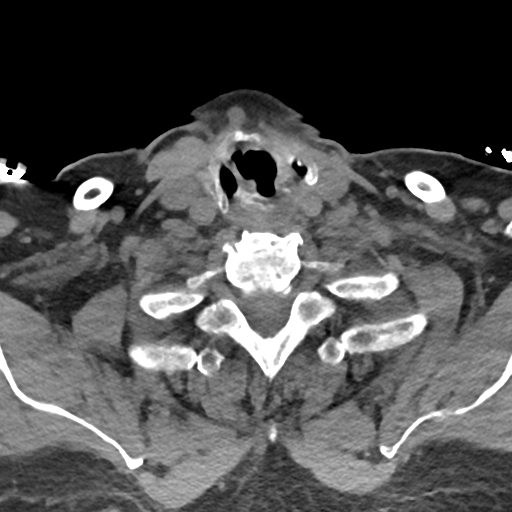
[im 14/95  bone]
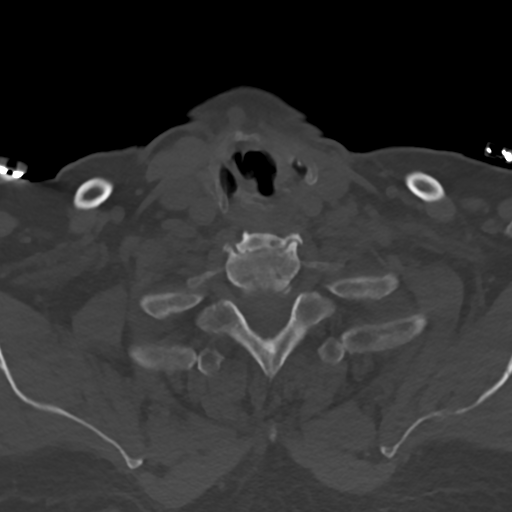
[im 27/95  bone]
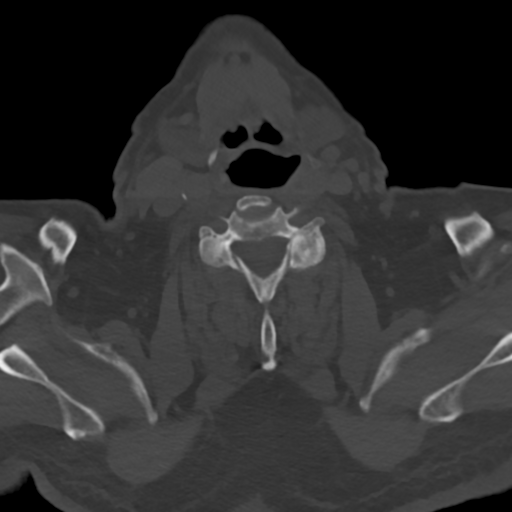
[im 54/95  bone]
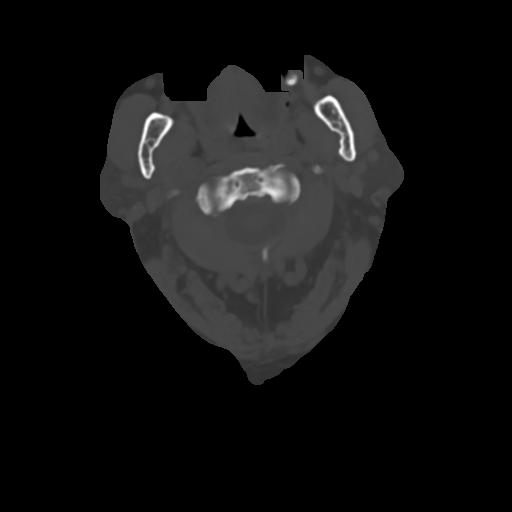
[im 68/95  bone]
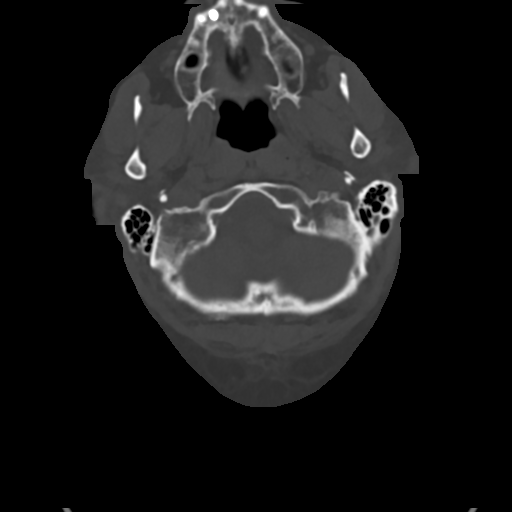
[im 81/95  soft-tissue]
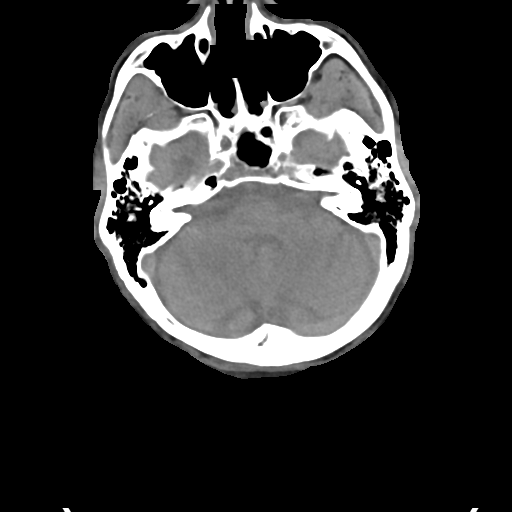
[im 81/95  bone]
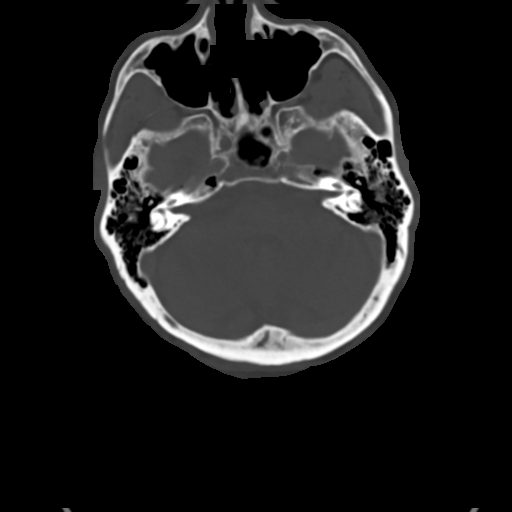

[Series 6: c_spine 2.0 sag bone · sagittal · 0.43mm/px · 5 of 61 slices shown, 6 images]
[im 21/61  bone]
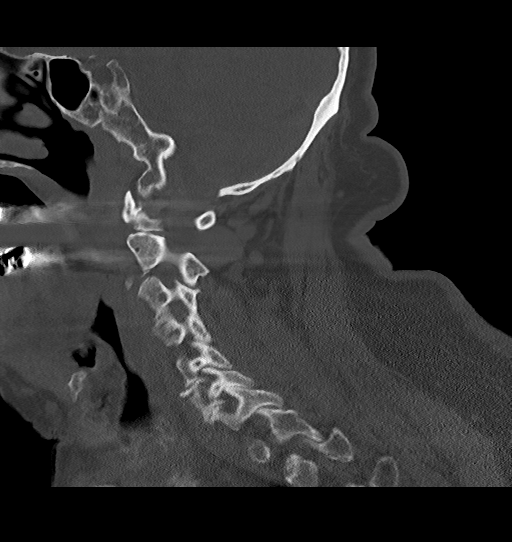
[im 26/61  bone]
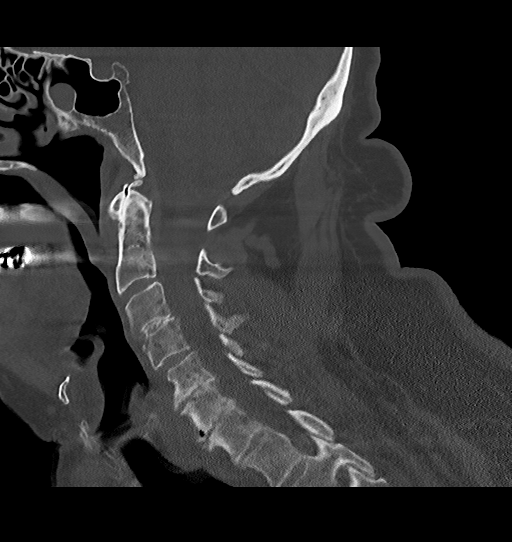
[im 31/61  soft-tissue]
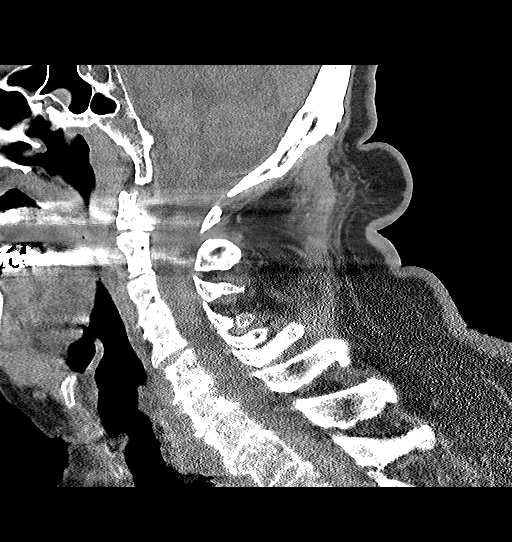
[im 31/61  bone]
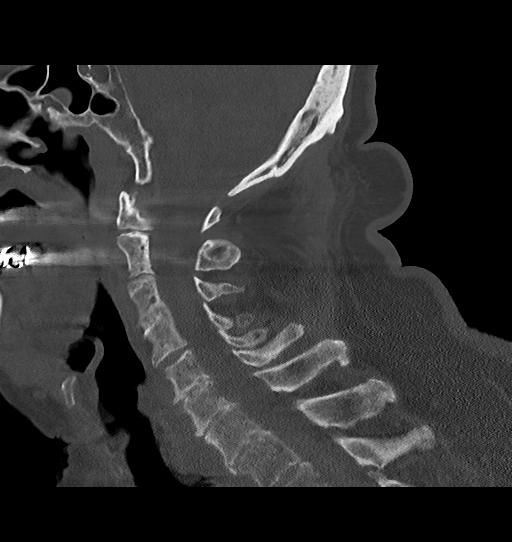
[im 36/61  bone]
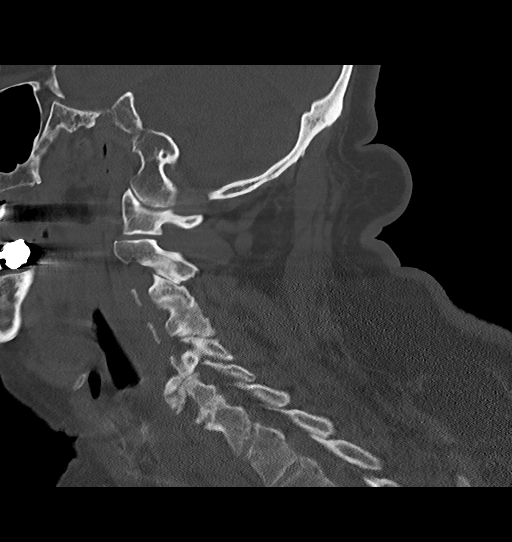
[im 41/61  bone]
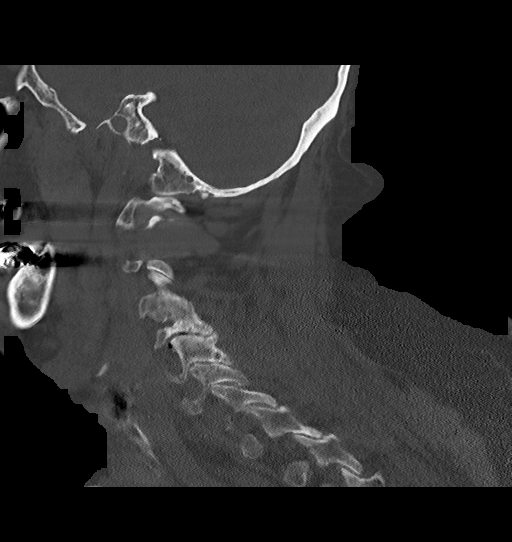

[Series 7: c_spine 2.0 cor bone · coronal · 0.28mm/px · 3 of 61 slices shown]
[im 16/61  bone]
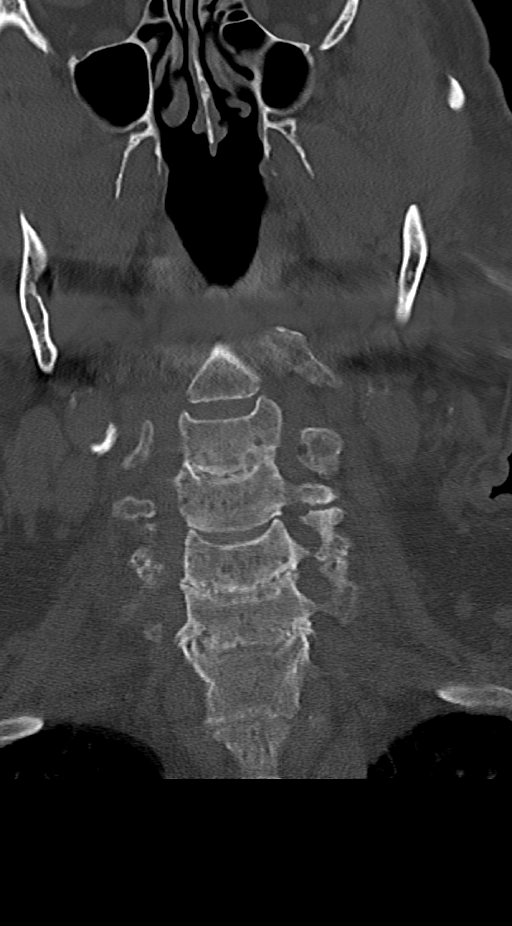
[im 26/61  bone]
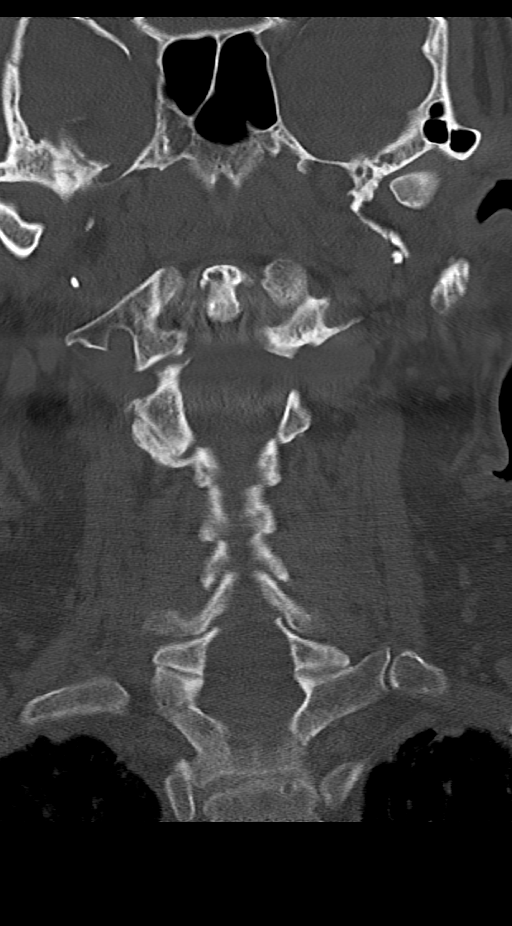
[im 35/61  bone]
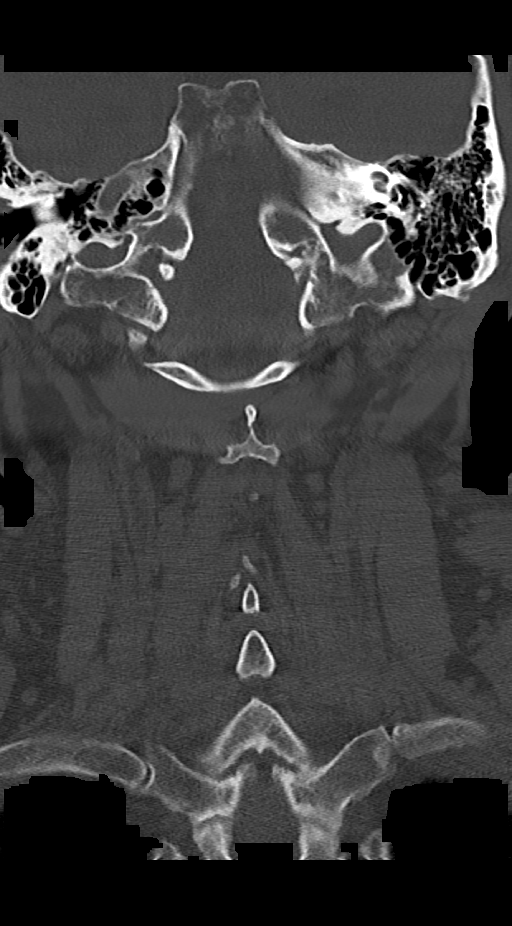

[13 of 33 positions shown; findings below may reference images not displayed]

FINDINGS: Alignment: There is no appreciable spondylolisthesis.

Skull base and vertebrae: The skull base and craniocervical junction
regions appear normal. No evident acute fracture. No blastic or
lytic bone lesions. There are cystic areas in the odontoid region.

Soft tissues and spinal canal: Prevertebral soft tissues and
predental space regions are normal. There is no evident cord or
canal hematoma. No paraspinous lesions are appreciable.

Disc levels: There is moderately severe disc space narrowing at
C3-4, C5-6, C6-7, and C7-T1. There is facet hypertrophy to varying
degrees at all levels. There is impression on the exiting nerve root
with relative nerve root effacement at C4-5 on the left and at C5-6
on the right. No frank disc extrusion or stenosis evident.

Upper chest: Visualized upper lung regions are clear.

Other: There are foci of subclavian and carotid artery calcification
bilaterally. Retention cyst noted in anterior left sphenoid sinus.
IMPRESSION: No appreciable fracture or spondylolisthesis. Extensive multifocal
arthropathy. Impression on exiting nerve roots due to bony
hypertrophy at C4-5 on the left at C5-6 on the right. No frank disc
extrusion or stenosis. Foci of subclavian and carotid artery
calcification bilaterally noted.

## 2022-01-09 IMAGING — CT CT HEAD W/O CM
4 series · 15 of 47 positions shown, 17 images · non-contrast
Comparison: None.

CLINICAL DATA: Pain following trauma

EXAM:
CT HEAD WITHOUT CONTRAST
TECHNIQUE: Contiguous axial images were obtained from the base of the skull
through the vertex without intravenous contrast.

[Series 3: head bone · axial · 0.49mm/px · z∈[-108,-90]mm · 2 of 89 slices shown]
[im 9/89  bone]
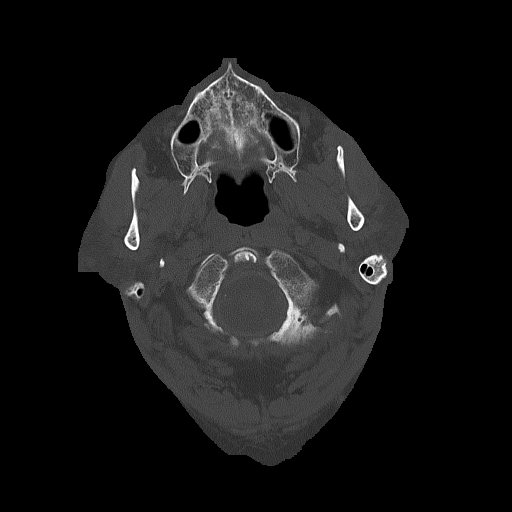
[im 18/89  bone]
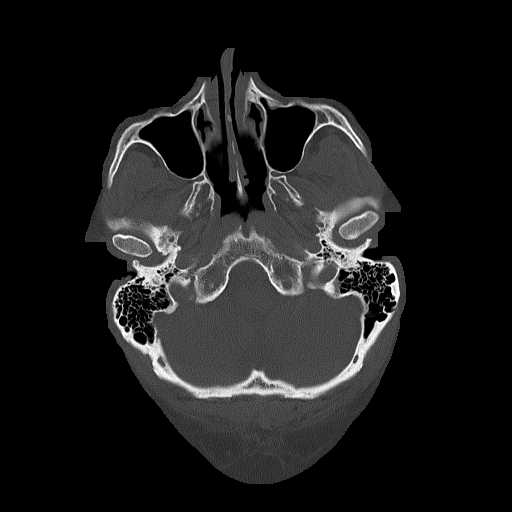

[Series 4: head without · axial · non-contrast · 0.47mm/px · z∈[-105,+25]mm · 7 of 36 slices shown, 9 images]
[im 5/36  brain]
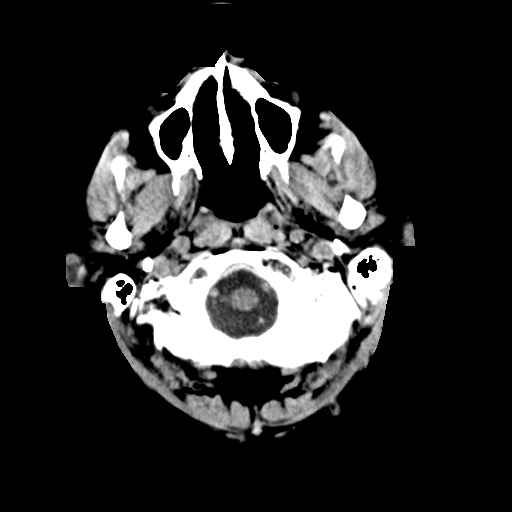
[im 5/36  bone]
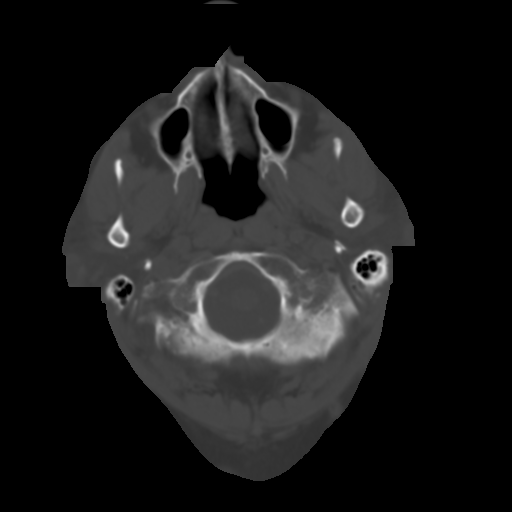
[im 9/36  brain]
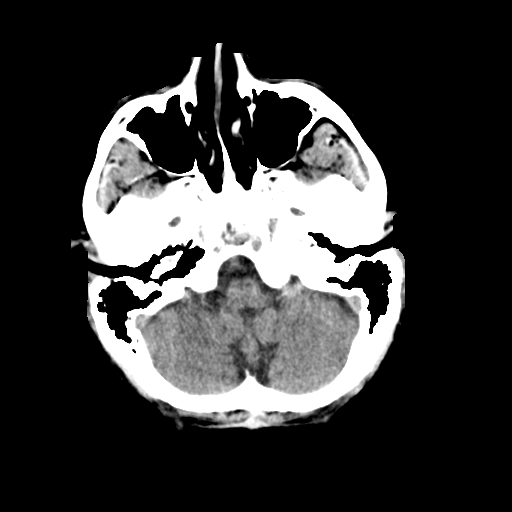
[im 14/36  brain]
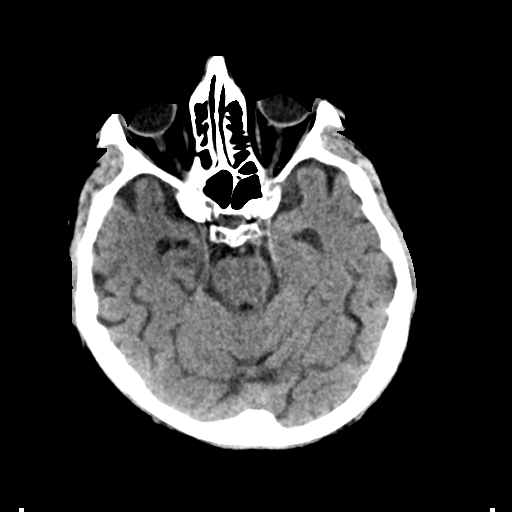
[im 18/36  brain]
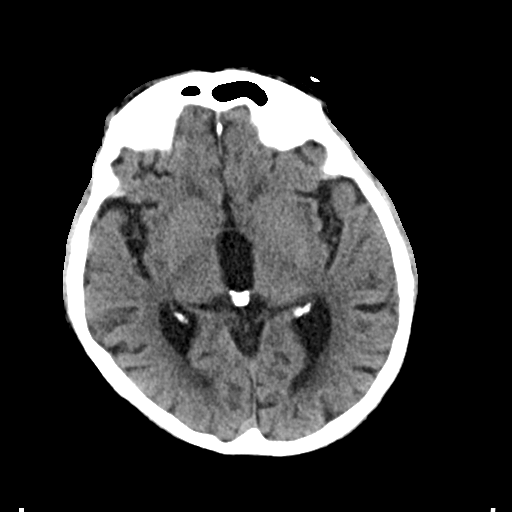
[im 22/36  brain]
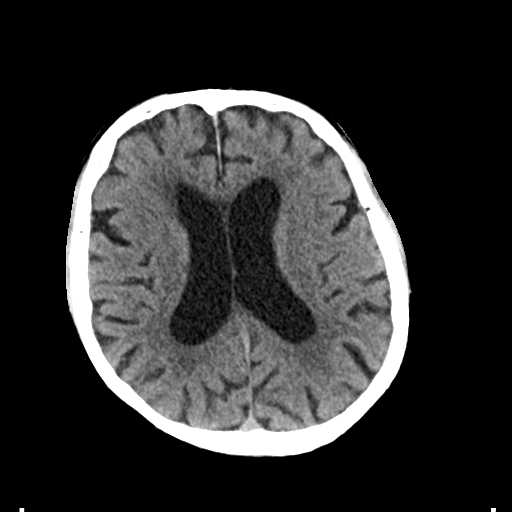
[im 22/36  bone]
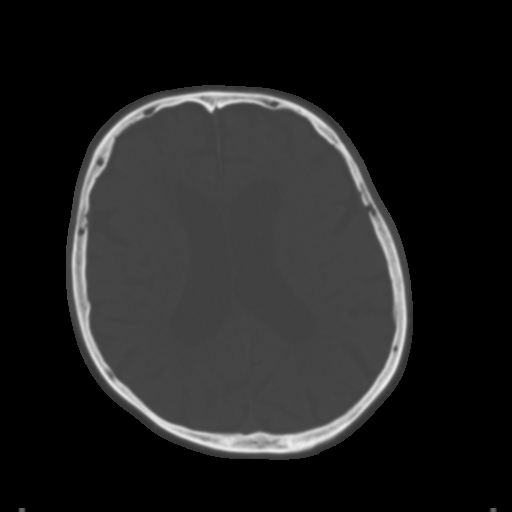
[im 27/36  brain]
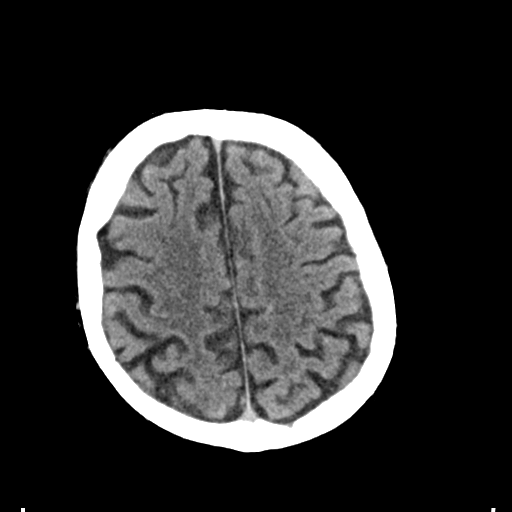
[im 31/36  brain]
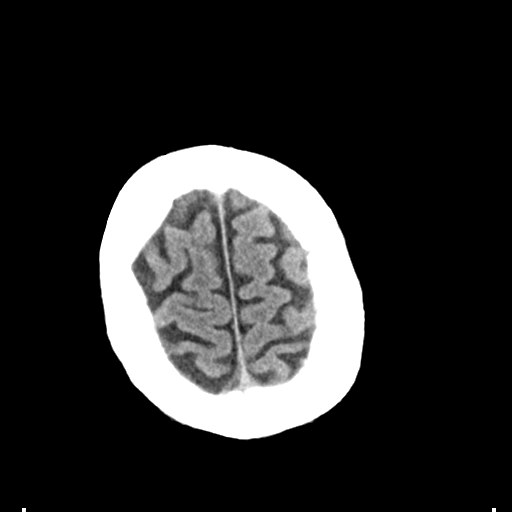

[Series 5: head without cor · coronal · non-contrast · 0.35mm/px · 3 of 75 slices shown]
[im 25/75  brain]
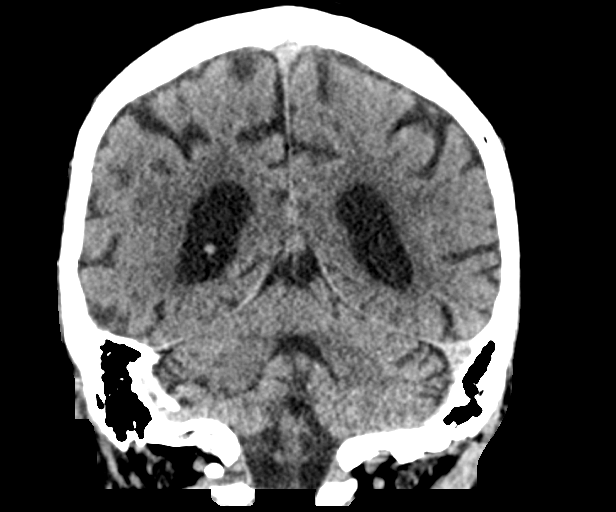
[im 33/75  brain]
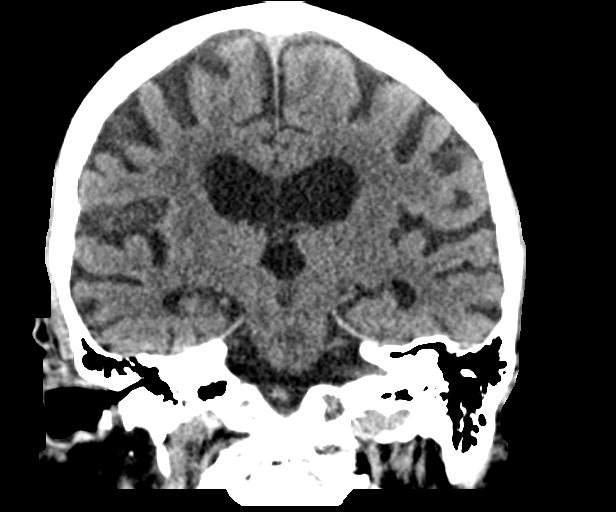
[im 42/75  brain]
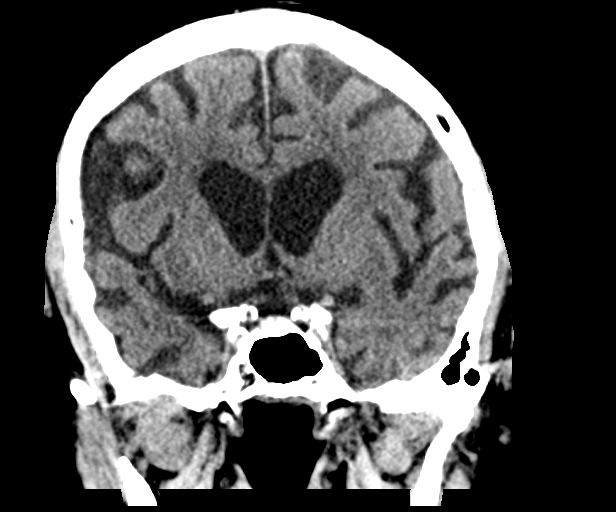

[Series 6: head without sag · sagittal · non-contrast · 0.35mm/px · 3 of 67 slices shown]
[im 23/67  brain]
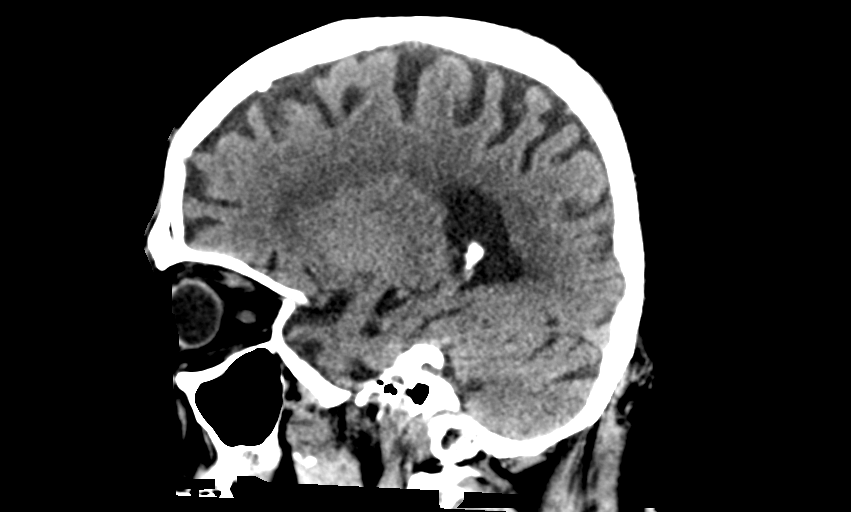
[im 34/67  brain]
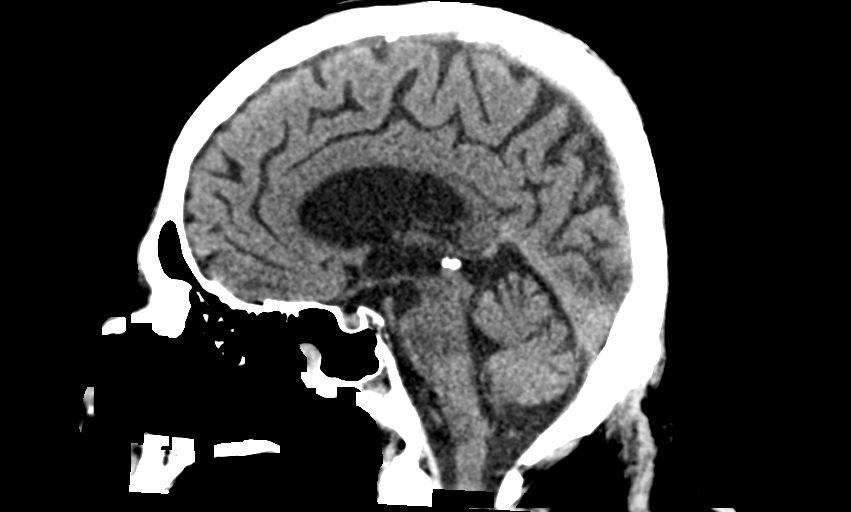
[im 45/67  brain]
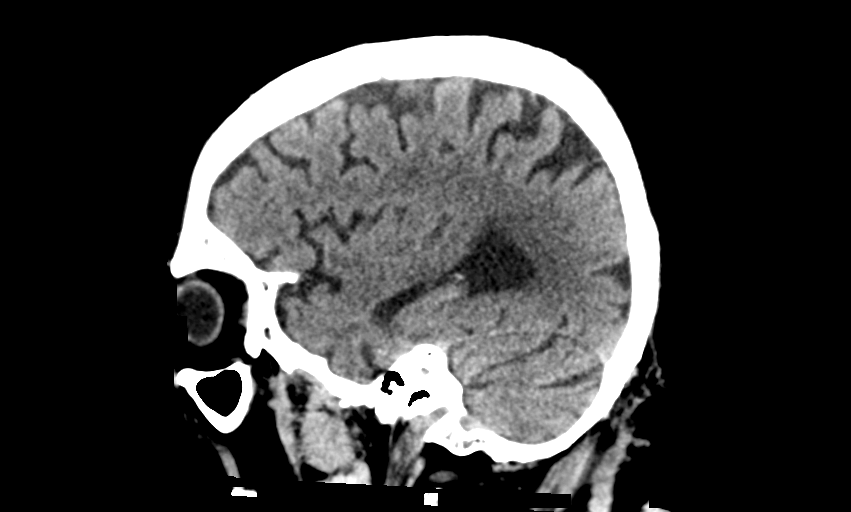

[15 of 47 positions shown; findings below may reference images not displayed]

FINDINGS: Brain: There is mild diffuse atrophy. There is no intracranial mass,
hemorrhage, extra-axial fluid collection, or midline shift. There is
slight small vessel disease in the centra semiovale bilaterally. No
acute infarct is evident.

Vascular: No evident hyperdense vessel. There is calcification in
each carotid siphon region.

Skull: Bony calvarium appears intact.

Sinuses/Orbits: There is a retention cyst in the anterior left
sphenoid sinus. There is mucosal thickening in several ethmoid air
cells. There is rightward deviation of the nasal septum. The orbits
appear symmetric bilaterally.

Other: Mastoid air cells are clear.
IMPRESSION: Atrophy with slight periventricular small vessel disease. No mass or
hemorrhage. No acute infarct.

There are foci of arterial vascular calcification. There are foci of
paranasal sinus disease. There is deviation of the nasal septum.
